# Patient Record
Sex: Female | Born: 1945 | Race: White | Hispanic: No | Marital: Married | Smoking: Never smoker
Health system: Southern US, Community
[De-identification: ages and names within clinical notes are randomized; demographics above are authoritative.]

## PROBLEM LIST (undated history)

## (undated) DIAGNOSIS — IMO0002 Reserved for concepts with insufficient information to code with codable children: Secondary | ICD-10-CM

## (undated) DIAGNOSIS — K219 Gastro-esophageal reflux disease without esophagitis: Secondary | ICD-10-CM

## (undated) DIAGNOSIS — M1990 Unspecified osteoarthritis, unspecified site: Secondary | ICD-10-CM

## (undated) DIAGNOSIS — R928 Other abnormal and inconclusive findings on diagnostic imaging of breast: Secondary | ICD-10-CM

## (undated) DIAGNOSIS — Z973 Presence of spectacles and contact lenses: Secondary | ICD-10-CM

## (undated) DIAGNOSIS — L259 Unspecified contact dermatitis, unspecified cause: Secondary | ICD-10-CM

## (undated) DIAGNOSIS — Z972 Presence of dental prosthetic device (complete) (partial): Secondary | ICD-10-CM

## (undated) DIAGNOSIS — IMO0001 Reserved for inherently not codable concepts without codable children: Secondary | ICD-10-CM

## (undated) DIAGNOSIS — C50919 Malignant neoplasm of unspecified site of unspecified female breast: Secondary | ICD-10-CM

## (undated) DIAGNOSIS — I10 Essential (primary) hypertension: Secondary | ICD-10-CM

## (undated) DIAGNOSIS — F411 Generalized anxiety disorder: Secondary | ICD-10-CM

## (undated) DIAGNOSIS — K08109 Complete loss of teeth, unspecified cause, unspecified class: Secondary | ICD-10-CM

## (undated) HISTORY — DX: Reserved for concepts with insufficient information to code with codable children: IMO0002

## (undated) HISTORY — DX: Unspecified contact dermatitis, unspecified cause: L25.9

## (undated) HISTORY — DX: Unspecified osteoarthritis, unspecified site: M19.90

## (undated) HISTORY — DX: Gastro-esophageal reflux disease without esophagitis: K21.9

## (undated) HISTORY — DX: Reserved for inherently not codable concepts without codable children: IMO0001

## (undated) HISTORY — DX: Other abnormal and inconclusive findings on diagnostic imaging of breast: R92.8

## (undated) HISTORY — DX: Generalized anxiety disorder: F41.1

## (undated) HISTORY — PX: MASTECTOMY: SHX3

## (undated) HISTORY — PX: OTHER SURGICAL HISTORY: SHX169

---

## 1978-01-13 HISTORY — PX: REDUCTION MAMMAPLASTY: SUR839

## 1980-01-14 HISTORY — PX: BREAST LUMPECTOMY: SHX2

## 1997-08-15 ENCOUNTER — Other Ambulatory Visit: Admission: RE | Admit: 1997-08-15 | Discharge: 1997-08-15 | Payer: Self-pay | Admitting: *Deleted

## 1997-08-29 ENCOUNTER — Other Ambulatory Visit: Admission: RE | Admit: 1997-08-29 | Discharge: 1997-08-29 | Payer: Self-pay | Admitting: *Deleted

## 1999-04-23 ENCOUNTER — Encounter: Admission: RE | Admit: 1999-04-23 | Discharge: 1999-04-23 | Payer: Self-pay | Admitting: *Deleted

## 1999-04-23 ENCOUNTER — Encounter: Payer: Self-pay | Admitting: Family Medicine

## 1999-08-06 ENCOUNTER — Emergency Department (HOSPITAL_COMMUNITY): Admission: EM | Admit: 1999-08-06 | Discharge: 1999-08-06 | Payer: Self-pay | Admitting: Emergency Medicine

## 1999-08-07 ENCOUNTER — Encounter: Payer: Self-pay | Admitting: Gastroenterology

## 2000-06-16 ENCOUNTER — Other Ambulatory Visit: Admission: RE | Admit: 2000-06-16 | Discharge: 2000-06-16 | Payer: Self-pay | Admitting: *Deleted

## 2000-07-01 ENCOUNTER — Encounter: Payer: Self-pay | Admitting: *Deleted

## 2000-07-01 ENCOUNTER — Encounter: Payer: Self-pay | Admitting: Family Medicine

## 2000-07-01 ENCOUNTER — Encounter: Admission: RE | Admit: 2000-07-01 | Discharge: 2000-07-01 | Payer: Self-pay | Admitting: Family Medicine

## 2000-07-01 ENCOUNTER — Encounter: Admission: RE | Admit: 2000-07-01 | Discharge: 2000-07-01 | Payer: Self-pay | Admitting: *Deleted

## 2001-07-01 ENCOUNTER — Other Ambulatory Visit: Admission: RE | Admit: 2001-07-01 | Discharge: 2001-07-01 | Payer: Self-pay | Admitting: Obstetrics and Gynecology

## 2001-07-02 ENCOUNTER — Encounter: Payer: Self-pay | Admitting: Family Medicine

## 2001-07-02 ENCOUNTER — Encounter: Admission: RE | Admit: 2001-07-02 | Discharge: 2001-07-02 | Payer: Self-pay | Admitting: Family Medicine

## 2002-07-27 ENCOUNTER — Other Ambulatory Visit: Admission: RE | Admit: 2002-07-27 | Discharge: 2002-07-27 | Payer: Self-pay | Admitting: Obstetrics and Gynecology

## 2002-08-01 ENCOUNTER — Encounter: Admission: RE | Admit: 2002-08-01 | Discharge: 2002-08-01 | Payer: Self-pay | Admitting: Family Medicine

## 2002-08-01 ENCOUNTER — Encounter: Payer: Self-pay | Admitting: Family Medicine

## 2003-08-02 ENCOUNTER — Encounter: Admission: RE | Admit: 2003-08-02 | Discharge: 2003-08-02 | Payer: Self-pay | Admitting: Family Medicine

## 2003-08-30 ENCOUNTER — Encounter: Admission: RE | Admit: 2003-08-30 | Discharge: 2003-08-30 | Payer: Self-pay | Admitting: Obstetrics and Gynecology

## 2004-10-15 ENCOUNTER — Encounter: Admission: RE | Admit: 2004-10-15 | Discharge: 2004-10-15 | Payer: Self-pay | Admitting: Obstetrics & Gynecology

## 2004-10-28 ENCOUNTER — Encounter: Admission: RE | Admit: 2004-10-28 | Discharge: 2004-10-28 | Payer: Self-pay | Admitting: Obstetrics & Gynecology

## 2005-06-16 ENCOUNTER — Encounter: Admission: RE | Admit: 2005-06-16 | Discharge: 2005-06-16 | Payer: Self-pay | Admitting: Family Medicine

## 2005-10-22 ENCOUNTER — Encounter: Admission: RE | Admit: 2005-10-22 | Discharge: 2005-10-22 | Payer: Self-pay | Admitting: Family Medicine

## 2006-09-02 ENCOUNTER — Other Ambulatory Visit: Admission: RE | Admit: 2006-09-02 | Discharge: 2006-09-02 | Payer: Self-pay | Admitting: Family Medicine

## 2006-11-24 ENCOUNTER — Encounter: Admission: RE | Admit: 2006-11-24 | Discharge: 2006-11-24 | Payer: Self-pay | Admitting: Family Medicine

## 2006-12-01 ENCOUNTER — Encounter: Admission: RE | Admit: 2006-12-01 | Discharge: 2006-12-01 | Payer: Self-pay | Admitting: Family Medicine

## 2006-12-11 ENCOUNTER — Encounter: Admission: RE | Admit: 2006-12-11 | Discharge: 2006-12-11 | Payer: Self-pay | Admitting: Family Medicine

## 2007-05-26 ENCOUNTER — Encounter: Admission: RE | Admit: 2007-05-26 | Discharge: 2007-05-26 | Payer: Self-pay | Admitting: Family Medicine

## 2007-06-18 ENCOUNTER — Emergency Department (HOSPITAL_COMMUNITY): Admission: EM | Admit: 2007-06-18 | Discharge: 2007-06-18 | Payer: Self-pay | Admitting: Emergency Medicine

## 2007-06-19 ENCOUNTER — Ambulatory Visit (HOSPITAL_COMMUNITY): Admission: EM | Admit: 2007-06-19 | Discharge: 2007-06-19 | Payer: Self-pay | Admitting: Emergency Medicine

## 2007-09-21 LAB — CONVERTED CEMR LAB
ALT: 20 units/L
AST: 22 units/L
Albumin: 4.2 g/dL
Alkaline Phosphatase: 136 units/L
BUN: 13 mg/dL
Basophils Relative: 1 %
Bilirubin Urine: NEGATIVE
CO2: 28 meq/L
Calcium: 10.4 mg/dL
Chloride: 105 meq/L
Cholesterol: 196 mg/dL
Creatinine, Ser: 0.7 mg/dL
Eosinophils Relative: 3 %
Glucose, Bld: 94 mg/dL
Glucose, Urine, Semiquant: NEGATIVE
HCT: 41.2 %
HDL: 56 mg/dL
Hemoglobin: 13.1 g/dL
Ketones, urine, test strip: NEGATIVE
LDL (calc): 25 mg/dL
LDL Cholesterol: 115 mg/dL
Lymphocytes, automated: 31 %
MCV: 90 fL
Monocytes Relative: 10 %
Nitrite: NEGATIVE
Platelets: 217 10*3/uL
Potassium: 3.7 meq/L
Protein, U semiquant: NEGATIVE
RBC: 4.58 M/uL
RDW: 13.2 %
Sodium: 142 meq/L
Specific Gravity, Urine: 1.014
TSH: 1.283 microintl units/mL
Total Protein: 7.5 g/dL
Triglyceride fasting, serum: 123 mg/dL
Urobilinogen, UA: 0.2
WBC: 6.1 10*3/uL
pH: 7

## 2007-09-24 ENCOUNTER — Other Ambulatory Visit: Admission: RE | Admit: 2007-09-24 | Discharge: 2007-09-24 | Payer: Self-pay | Admitting: Family Medicine

## 2008-01-26 ENCOUNTER — Encounter: Admission: RE | Admit: 2008-01-26 | Discharge: 2008-01-26 | Payer: Self-pay | Admitting: Family Medicine

## 2008-07-10 DIAGNOSIS — F411 Generalized anxiety disorder: Secondary | ICD-10-CM | POA: Insufficient documentation

## 2008-07-10 DIAGNOSIS — R609 Edema, unspecified: Secondary | ICD-10-CM | POA: Insufficient documentation

## 2008-07-10 DIAGNOSIS — Z8679 Personal history of other diseases of the circulatory system: Secondary | ICD-10-CM | POA: Insufficient documentation

## 2008-07-10 DIAGNOSIS — R0602 Shortness of breath: Secondary | ICD-10-CM | POA: Insufficient documentation

## 2008-07-10 DIAGNOSIS — K219 Gastro-esophageal reflux disease without esophagitis: Secondary | ICD-10-CM | POA: Insufficient documentation

## 2008-07-11 ENCOUNTER — Ambulatory Visit: Payer: Self-pay | Admitting: Internal Medicine

## 2008-07-11 DIAGNOSIS — M129 Arthropathy, unspecified: Secondary | ICD-10-CM | POA: Insufficient documentation

## 2008-07-11 DIAGNOSIS — Z9189 Other specified personal risk factors, not elsewhere classified: Secondary | ICD-10-CM | POA: Insufficient documentation

## 2008-07-11 DIAGNOSIS — Z87448 Personal history of other diseases of urinary system: Secondary | ICD-10-CM | POA: Insufficient documentation

## 2008-07-11 DIAGNOSIS — L259 Unspecified contact dermatitis, unspecified cause: Secondary | ICD-10-CM | POA: Insufficient documentation

## 2008-07-11 DIAGNOSIS — I10 Essential (primary) hypertension: Secondary | ICD-10-CM | POA: Insufficient documentation

## 2008-07-11 LAB — CONVERTED CEMR LAB: Pap Smear: NORMAL

## 2008-12-11 ENCOUNTER — Ambulatory Visit: Payer: Self-pay | Admitting: Internal Medicine

## 2008-12-11 LAB — CONVERTED CEMR LAB
ALT: 18 units/L (ref 0–35)
AST: 19 units/L (ref 0–37)
Albumin: 3.5 g/dL (ref 3.5–5.2)
Alkaline Phosphatase: 119 units/L — ABNORMAL HIGH (ref 39–117)
BUN: 8 mg/dL (ref 6–23)
Basophils Absolute: 0.1 10*3/uL (ref 0.0–0.1)
Basophils Relative: 1.3 % (ref 0.0–3.0)
Bilirubin Urine: NEGATIVE
Bilirubin, Direct: 0.2 mg/dL (ref 0.0–0.3)
CO2: 30 meq/L (ref 19–32)
Calcium: 9.5 mg/dL (ref 8.4–10.5)
Chloride: 107 meq/L (ref 96–112)
Cholesterol: 212 mg/dL — ABNORMAL HIGH (ref 0–200)
Creatinine, Ser: 0.7 mg/dL (ref 0.4–1.2)
Direct LDL: 137.4 mg/dL
Eosinophils Absolute: 0.2 10*3/uL (ref 0.0–0.7)
Eosinophils Relative: 2.4 % (ref 0.0–5.0)
GFR calc non Af Amer: 89.9 mL/min (ref >60)
Glucose, Bld: 92 mg/dL (ref 70–99)
HCT: 37.4 % (ref 36.0–46.0)
HDL: 61.8 mg/dL (ref >39.00)
Hemoglobin: 12.9 g/dL (ref 12.0–15.0)
Ketones, ur: NEGATIVE mg/dL
Lymphocytes Relative: 26 % (ref 12.0–46.0)
Lymphs Abs: 1.7 10*3/uL (ref 0.7–4.0)
MCHC: 34.6 g/dL (ref 30.0–36.0)
MCV: 89.8 fL (ref 78.0–100.0)
Monocytes Absolute: 0.6 10*3/uL (ref 0.1–1.0)
Monocytes Relative: 8.6 % (ref 3.0–12.0)
Neutro Abs: 3.8 10*3/uL (ref 1.4–7.7)
Neutrophils Relative %: 61.7 % (ref 43.0–77.0)
Nitrite: NEGATIVE
Platelets: 187 10*3/uL (ref 150.0–400.0)
Potassium: 3.5 meq/L (ref 3.5–5.1)
RBC: 4.16 M/uL (ref 3.87–5.11)
RDW: 12.3 % (ref 11.5–14.6)
Sodium: 142 meq/L (ref 135–145)
Specific Gravity, Urine: 1.015 (ref 1.000–1.030)
TSH: 1.19 microintl units/mL (ref 0.35–5.50)
Total Bilirubin: 1.2 mg/dL (ref 0.3–1.2)
Total CHOL/HDL Ratio: 3
Total Protein, Urine: NEGATIVE mg/dL
Total Protein: 6.6 g/dL (ref 6.0–8.3)
Triglycerides: 73 mg/dL (ref 0.0–149.0)
Urine Glucose: NEGATIVE mg/dL
Urobilinogen, UA: 0.2 (ref 0.0–1.0)
VLDL: 14.6 mg/dL (ref 0.0–40.0)
WBC: 6.4 10*3/uL (ref 4.5–10.5)
pH: 7 (ref 5.0–8.0)

## 2008-12-13 ENCOUNTER — Ambulatory Visit: Payer: Self-pay | Admitting: Internal Medicine

## 2008-12-15 ENCOUNTER — Encounter (INDEPENDENT_AMBULATORY_CARE_PROVIDER_SITE_OTHER): Payer: Self-pay | Admitting: *Deleted

## 2008-12-18 ENCOUNTER — Telehealth: Payer: Self-pay | Admitting: Internal Medicine

## 2008-12-22 ENCOUNTER — Emergency Department (HOSPITAL_COMMUNITY): Admission: EM | Admit: 2008-12-22 | Discharge: 2008-12-22 | Payer: Self-pay | Admitting: Emergency Medicine

## 2008-12-26 ENCOUNTER — Emergency Department (HOSPITAL_COMMUNITY): Admission: EM | Admit: 2008-12-26 | Discharge: 2008-12-26 | Payer: Self-pay | Admitting: Emergency Medicine

## 2009-02-08 ENCOUNTER — Encounter: Admission: RE | Admit: 2009-02-08 | Discharge: 2009-02-08 | Payer: Self-pay | Admitting: Internal Medicine

## 2009-02-08 LAB — HM MAMMOGRAPHY: HM Mammogram: NEGATIVE

## 2009-06-25 ENCOUNTER — Telehealth: Payer: Self-pay | Admitting: Internal Medicine

## 2009-06-26 ENCOUNTER — Encounter (INDEPENDENT_AMBULATORY_CARE_PROVIDER_SITE_OTHER): Payer: Self-pay | Admitting: *Deleted

## 2009-09-11 ENCOUNTER — Ambulatory Visit: Payer: Self-pay | Admitting: Internal Medicine

## 2009-09-11 DIAGNOSIS — N390 Urinary tract infection, site not specified: Secondary | ICD-10-CM | POA: Insufficient documentation

## 2009-09-11 LAB — CONVERTED CEMR LAB
Bilirubin Urine: NEGATIVE
Blood in Urine, dipstick: NEGATIVE
Glucose, Urine, Semiquant: NEGATIVE
Ketones, urine, test strip: NEGATIVE
Nitrite: NEGATIVE
Protein, U semiquant: NEGATIVE
Specific Gravity, Urine: <1.005
Urobilinogen, UA: 0.2
pH: 5

## 2009-10-23 ENCOUNTER — Ambulatory Visit: Payer: Self-pay | Admitting: Internal Medicine

## 2010-02-14 NOTE — Progress Notes (Signed)
Summary: Jury Duty  Phone Note Call from Patient Call back at Home Phone [phone] Call back at Work Phone [phone]   Caller: Patient [phone] Summary of Call: pt called requesting MD excuse her from Jury Duty due to extreme stress at home (Husband, Mother) and work stress. Pt says she just does not think her nerves can handle it. Please advise Initial call taken by: Margaret Pyle, CMA,  June 25, 2009 4:06 PM  Follow-up for Phone Call        you are welcome to generate such a note and i will sign for pt to pick up ( and remindpt there $20 fee for letters/forms outside of [facility])--- but i am not sure my note can excuse pt from jury duty. thanks Follow-up by: Newt Lukes MD,  June 25, 2009 4:22 PM  Additional Follow-up for Phone Call Additional follow up Details #1::        Generated letter for pt., had Dr. Felicity Coyer sign, called pt informed of $20 fee and to pickup letter at front at her convenience. Pt. agreed to all. Additional Follow-up by: Scharlene Gloss,  June 26, 2009 8:46 AM

## 2010-02-14 NOTE — Assessment & Plan Note (Signed)
Summary: flu shot/leschber/cd  Nurse Visit   Allergies: 1)  ! Sulfa 2)  ! Penicillin  Orders Added: 1)  Admin 1st Vaccine [90471] 2)  Flu Vaccine 54yrs + [98119] Flu Vaccine Consent Questions     Do you have a history of severe allergic reactions to this vaccine? no    Any prior history of allergic reactions to egg and/or gelatin? no    Do you have a sensitivity to the preservative Thimersol? no    Do you have a past history of Guillan-Barre Syndrome? no    Do you currently have an acute febrile illness? no    Have you ever had a severe reaction to latex? no    Vaccine information given and explained to patient? yes    Are you currently pregnant? no    Lot Number:[id number]   Exp Date:07/13/2010   Site Given  Left Deltoid IMu Vaccine 104yrs + [14782] .lbflu

## 2010-02-14 NOTE — Letter (Signed)
Summary: Generic Letter  [facility]  [address] [place], [place] [address]   Phone: [phone]  Fax: [phone]    06/26/2009  Deanna Buck [address] [place], [place]  [address]  To Whom it may concern,  The above patient is due to serve on [employer]. Due to stress in her home at this time I feel it may not be of benefit for her to serve.  Please excuse Deanna Buck from [employer].           Sincerely,   Dr. Rene Paci

## 2010-02-14 NOTE — Assessment & Plan Note (Signed)
Summary: frequency/urgency with little urine output/cd   Vital Signs:  Patient profile:   65 year old female Menstrual status:  regular Height:      63 inches (160.02 cm) Weight:      168.0 pounds (76.36 kg) BMI:     29.87 O2 Sat:      95 % Temp:     97.5 degrees F (36.39 degrees C) oral Pulse rate:   59 / minute BP sitting:   110 / 72  (left arm) Cuff size:   regular  Vitals Entered By: Orlan Leavens RMA (September 11, 2009 2:23 PM) CC: Urine frequency Is Patient Diabetic? No Pain Assessment Patient in pain? no      Comments Pt states last week she had episode having alot of urine fequency but when she would try to urinate she could'nt. Started taking Unistat (ovc) for bladder infection.    Primary Care Provider:  Newt Lukes MD  CC:  Urine frequency.  History of Present Illness: c/o urinary frequency - small vol voids no hematuria -  no abd or flank pain +hx same a/w UTI - improved but not resolved with otc meds  c/o ongoing periodic rash - currently on palms of both hands - gets worse with stress and weather changes   Clinical Review Panels:  CBC   WBC:  6.4 (12/11/2008)   RBC:  4.16 (12/11/2008)   Hgb:  12.9 (12/11/2008)   Hct:  37.4 (12/11/2008)   Platelets:  187.0 (12/11/2008)   MCV  89.8 (12/11/2008)   MCHC  34.6 (12/11/2008)   RDW  12.3 (12/11/2008)   PMN:  61.7 (12/11/2008)   Lymphs:  26.0 (12/11/2008)   Monos:  8.6 (12/11/2008)   Eosinophils:  2.4 (12/11/2008)   Basophil:  1.3 (12/11/2008)  Complete Metabolic Panel   Glucose:  92 (12/11/2008)   Sodium:  142 (12/11/2008)   Potassium:  3.5 (12/11/2008)   Chloride:  107 (12/11/2008)   CO2:  30 (12/11/2008)   BUN:  8 (12/11/2008)   Creatinine:  0.7 (12/11/2008)   Albumin:  3.5 (12/11/2008)   Total Protein:  6.6 (12/11/2008)   Calcium:  9.5 (12/11/2008)   Total Bili:  1.2 (12/11/2008)   Alk Phos:  119 (12/11/2008)   SGPT (ALT):  18 (12/11/2008)   SGOT (AST):  19 (12/11/2008)   Current  Medications (verified): 1)  Clonazepam 0.5 Mg Tabs (Clonazepam) .... Take 1 Two Times A Day Prn 2)  Nadolol 40 Mg Tabs (Nadolol) .... Take 1/2 By Mouth Qd 3)  Hydrochlorothiazide 25 Mg Tabs (Hydrochlorothiazide) .... Take 1 By Mouth Qd 4)  Valtrex 1 Gm Tabs (Valacyclovir Hcl) .... Use As Directed Prn 5)  Cyclobenzaprine Hcl 10 Mg Tabs (Cyclobenzaprine Hcl) .... Take 1 Three Times A Day Prn 6)  Cyanocobalamin 2500 Mcg  (Cyanocobalamin) .... Take 1 By Mouth Qd 7)  Alli .... Take 1 Three Times A Day 8)  Triamcinolone Acetonide 0.1 % Crea (Triamcinolone Acetonide) .... Apply To Affected Area As Needed 9)  Triamcinolone Acetonide 0.1 % Oint (Triamcinolone Acetonide) .... Apply Two Times A Day - Three Times A Day As Needed For Itch and Rash (Only Use On Palms or Soles of Feet)  Allergies (verified): 1)  ! Sulfa 2)  ! Penicillin  Past History:  Past Medical History: Anxiety GERD hypertension Osteoarthritis  Social History: Married but stressful relationship with spouse Never Smoked works at [employer]  Review of Systems  The patient denies fever, peripheral edema, and abdominal pain.  Physical Exam  General:  alert, well-developed, well-nourished, and cooperative to examination.    Lungs:  normal respiratory effort, no intercostal retractions or use of accessory muscles; normal breath sounds bilaterally - no crackles and no wheezes.    Heart:  normal rate, regular rhythm, no murmur, and no rub. BLE without edema.  Abdomen:  soft, non-tender, normal bowel sounds, no distention; no masses and no appreciable hepatomegaly or splenomegaly.   Psych:  Oriented X3, memory intact for recent and remote, normally interactive, good eye contact, min anxious appearing, not depressed appearing, and not agitated.      Impression & Recommendations:  Problem # 1:  UTI (ICD-599.0)  Her updated medication list for this problem includes:    Ciprofloxacin Hcl 500 Mg Tabs (Ciprofloxacin hcl)  .Marland Kitchen... 1 by mouth two times a day x 3days  Orders: Prescription Created Electronically [phone]) T-Culture, Urine ([id number])  Encouraged to push clear liquids, get enough rest, and take acetaminophen as needed. To be seen in 10 days if no improvement, sooner if worse.  Complete Medication List: 1)  Clonazepam 0.5 Mg Tabs (Clonazepam) .... Take 1 two times a day prn 2)  Nadolol 40 Mg Tabs (Nadolol) .... Take 1/2 by mouth qd 3)  Hydrochlorothiazide 25 Mg Tabs (Hydrochlorothiazide) .... Take 1 by mouth qd 4)  Valtrex 1 Gm Tabs (Valacyclovir hcl) .... Use as directed prn 5)  Cyclobenzaprine Hcl 10 Mg Tabs (Cyclobenzaprine hcl) .... Take 1 three times a day prn 6)  Cyanocobalamin 2500 Mcg (cyanocobalamin)  .... Take 1 by mouth qd 7)  Alli  .... Take 1 three times a day 8)  Triamcinolone Acetonide 0.1 % Crea (Triamcinolone acetonide) .... Apply to affected area as needed 9)  Triamcinolone Acetonide 0.1 % Oint (Triamcinolone acetonide) .... Apply two times a day - three times a day as needed for itch and rash (only use on palms or soles of feet) 10)  Ciprofloxacin Hcl 500 Mg Tabs (Ciprofloxacin hcl) .Marland Kitchen.. 1 by mouth two times a day x 3days  Other Orders: UA Dipstick w/o Micro (manual) (40347)  Patient Instructions: 1)  it was good to see you today. 2)  cipro for bladder infection symptoms - your prescription has been electronically submitted to your pharmacy. Please take as directed. Contact our office if you believe you're having problems with the medication(s).  3)  Please schedule a follow-up appointment in 12/2009, sooner if problems.  Prescriptions: CIPROFLOXACIN HCL 500 MG TABS (CIPROFLOXACIN HCL) 1 by mouth two times a day x 3days  #6 x 0   Entered and Authorized by:   Newt Lukes MD   Signed by:   Newt Lukes MD on 09/11/2009   Method used:   Electronically to        [facility] [phone]* (retail)       [address]       [place], [place]  [address]       Ph: [phone] or [phone]       Fax: [phone]   RxID:   [id number]   Laboratory Results   Urine Tests    Routine Urinalysis   Color: yellow Appearance: Clear Glucose: negative   (Normal Range: Negative) Bilirubin: negative   (Normal Range: Negative) Ketone: negative   (Normal Range: Negative) Spec. Gravity: <1.005   (Normal Range: 1.003-1.035) Blood: negative   (Normal Range: Negative) pH: 5.0   (Normal Range: 5.0-8.0) Protein: negative   (  Normal Range: Negative) Urobilinogen: 0.2   (Normal Range: 0-1) Nitrite: negative   (Normal Range: Negative) Leukocyte Esterace: moderate   (Normal Range: Negative)

## 2010-02-22 ENCOUNTER — Encounter (INDEPENDENT_AMBULATORY_CARE_PROVIDER_SITE_OTHER): Payer: Self-pay | Admitting: *Deleted

## 2010-02-22 ENCOUNTER — Other Ambulatory Visit: Payer: Self-pay | Admitting: Internal Medicine

## 2010-02-22 ENCOUNTER — Other Ambulatory Visit: Payer: BC Managed Care – PPO

## 2010-02-22 DIAGNOSIS — Z0000 Encounter for general adult medical examination without abnormal findings: Secondary | ICD-10-CM

## 2010-02-22 DIAGNOSIS — E785 Hyperlipidemia, unspecified: Secondary | ICD-10-CM

## 2010-02-22 LAB — CBC WITH DIFFERENTIAL/PLATELET
Basophils Absolute: 0.1 10*3/uL (ref 0.0–0.1)
Eosinophils Absolute: 0.2 10*3/uL (ref 0.0–0.7)
Eosinophils Relative: 2.4 % (ref 0.0–5.0)
HCT: 38.7 % (ref 36.0–46.0)
Hemoglobin: 13.5 g/dL (ref 12.0–15.0)
Lymphocytes Relative: 26.5 % (ref 12.0–46.0)
Lymphs Abs: 1.7 10*3/uL (ref 0.7–4.0)
MCHC: 34.9 g/dL (ref 30.0–36.0)
Monocytes Absolute: 0.7 10*3/uL (ref 0.1–1.0)
Monocytes Relative: 11.8 % (ref 3.0–12.0)
Neutrophils Relative %: 58.2 % (ref 43.0–77.0)
Platelets: 201 10*3/uL (ref 150.0–400.0)
RBC: 4.45 Mil/uL (ref 3.87–5.11)
RDW: 12.8 % (ref 11.5–14.6)
WBC: 6.3 10*3/uL (ref 4.5–10.5)

## 2010-02-22 LAB — BASIC METABOLIC PANEL
BUN: 14 mg/dL (ref 6–23)
CO2: 31 mEq/L (ref 19–32)
Calcium: 10 mg/dL (ref 8.4–10.5)
Chloride: 105 mEq/L (ref 96–112)
Creatinine, Ser: 0.8 mg/dL (ref 0.4–1.2)
GFR: 82.7 mL/min (ref >60.00)
Glucose, Bld: 93 mg/dL (ref 70–99)
Potassium: 3.7 mEq/L (ref 3.5–5.1)
Sodium: 141 mEq/L (ref 135–145)

## 2010-02-22 LAB — HEPATIC FUNCTION PANEL
ALT: 12 U/L (ref 0–35)
AST: 19 U/L (ref 0–37)
Alkaline Phosphatase: 115 U/L (ref 39–117)
Bilirubin, Direct: 0.2 mg/dL (ref 0.0–0.3)
Total Bilirubin: 0.9 mg/dL (ref 0.3–1.2)
Total Protein: 6.4 g/dL (ref 6.0–8.3)

## 2010-02-22 LAB — URINALYSIS
Bilirubin Urine: NEGATIVE
Hgb urine dipstick: NEGATIVE
Ketones, ur: NEGATIVE
Leukocytes, UA: NEGATIVE
Specific Gravity, Urine: 1.01 (ref 1.000–1.030)
Total Protein, Urine: NEGATIVE
Urine Glucose: NEGATIVE
Urobilinogen, UA: 0.2 (ref 0.0–1.0)
pH: 7 (ref 5.0–8.0)

## 2010-02-22 LAB — LIPID PANEL
Cholesterol: 204 mg/dL — ABNORMAL HIGH (ref 0–200)
HDL: 53.6 mg/dL (ref >39.00)
Total CHOL/HDL Ratio: 4
VLDL: 23.2 mg/dL (ref 0.0–40.0)

## 2010-02-22 LAB — LDL CHOLESTEROL, DIRECT: Direct LDL: 131.5 mg/dL

## 2010-02-22 LAB — TSH: TSH: 1.46 u[IU]/mL (ref 0.35–5.50)

## 2010-03-01 ENCOUNTER — Encounter: Payer: Self-pay | Admitting: Internal Medicine

## 2010-03-01 ENCOUNTER — Encounter (INDEPENDENT_AMBULATORY_CARE_PROVIDER_SITE_OTHER): Payer: BC Managed Care – PPO | Admitting: Internal Medicine

## 2010-03-01 DIAGNOSIS — J069 Acute upper respiratory infection, unspecified: Secondary | ICD-10-CM | POA: Insufficient documentation

## 2010-03-01 DIAGNOSIS — Z0000 Encounter for general adult medical examination without abnormal findings: Secondary | ICD-10-CM

## 2010-03-06 NOTE — Assessment & Plan Note (Signed)
Summary: cpx/bcbs/#   Vital Signs:  Patient profile:   65 year old female Menstrual status:  regular Height:      63 inches (160.02 cm) Weight:      163.75 pounds (74.43 kg) BMI:     29.11 O2 Sat:      94 % on Room air Temp:     98.2 degrees F (36.78 degrees C) oral Pulse rate:   60 / minute Pulse rhythm:   regular BP sitting:   128 / 82  (left arm) Cuff size:   regular  Vitals Entered By: Brenton Grills CMA (AAMA) (March 01, 2010 1:08 PM)  O2 Flow:  Room air CC: CPX/refills on Nadolo, HCTZ, Valtrex, Cyclobenzaprine and Triamcinolone cream/aj Is Patient Diabetic? No   Primary Care Provider:  Newt Lukes MD  CC:  CPX/refills on Nadolo, HCTZ, Valtrex, and Cyclobenzaprine and Triamcinolone cream/aj.  History of Present Illness: patient is here today for annual physical. Patient feels well and has no complaints.   also reviewed chronic med issues: freq UTI symptoms and infx  anxiety - takes as needed klonopin to help tol spouse's mood swings - has not used in a while; also on nadolol for same since mid90's + stress at home in relationship with spouse, as well as at work. has never been treated for depression or anxiety. Specifically years ago, went to therapy, counseling, when husband was cheating on her. Feels counseling, then, was not helpful -but would be agreeable to try again. reviewed and denies suicidal or homicidal ideation  htn - pt denies hx same but noted in chart review from prior md records. Patient attributes elevated blood pressure due to stress. Takes nadolol for stress as stated above, and hydrochlorothiazide for control of the swelling  abn mammo due to L breast cysts - no cancer - has been on q 34mo mammo since '07 until oct 09 - now annual -due q oct ; +fh breast ca in mom  recurrent outbreak shingles  L neck - takes suppressive valtrex for same - no current symptoms   follows with derm for occ derm issues - eczema of bilateral feet, left greater  than right, attributed to tanning booth skin injuries - also ongoing periodic rash - currently on palms of both hands - gets worse with stress and weather changes- controls with steroid cream as needed - needs refill  Preventive Screening-Counseling & Management  Alcohol-Tobacco     Alcohol drinks/day: 0     Alcohol Counseling: not indicated; patient does not drink     Smoking Status: never     Tobacco Counseling: not indicated; no tobacco use  Caffeine-Diet-Exercise     Diet Counseling: not indicated; diet is assessed to be healthy     Does Patient Exercise: no     Exercise Counseling: to improve exercise regimen     Depression Counseling: not indicated; screening negative for depression  Safety-Violence-Falls     Seat Belt Counseling: not indicated; patient wears seat belts     Helmet Counseling: not applicable     Firearm Counseling: not applicable     Violence Counseling: not indicated; no violence risk noted     Fall Risk Counseling: not indicated; no significant falls noted  Clinical Review Panels:  Prevention   Last Mammogram:  ASSESSMENT: Negative - BI-RADS 1^MM DIGITAL SCREENING (02/08/2009)   Last Pap Smear:  Normal (07/11/2008)  Immunizations   Last Tetanus Booster:  Td (12/13/2008)   Last Flu Vaccine:  Fluvax 3+ (10/23/2009)  Lipid Management   Cholesterol:  204 (02/22/2010)   LDL (bad choesterol):  115 (09/21/2007)   HDL (good cholesterol):  53.60 (02/22/2010)   Triglycerides:  123 (09/21/2007)  CBC   WBC:  6.3 (02/22/2010)   RBC:  4.45 (02/22/2010)   Hgb:  13.5 (02/22/2010)   Hct:  38.7 (02/22/2010)   Platelets:  201.0 (02/22/2010)   MCV  86.8 (02/22/2010)   MCHC  34.9 (02/22/2010)   RDW  12.8 (02/22/2010)   PMN:  58.2 (02/22/2010)   Lymphs:  26.5 (02/22/2010)   Monos:  11.8 (02/22/2010)   Eosinophils:  2.4 (02/22/2010)   Basophil:  1.1 (02/22/2010)  Complete Metabolic Panel   Glucose:  93 (02/22/2010)   Sodium:  141 (02/22/2010)   Potassium:   3.7 (02/22/2010)   Chloride:  105 (02/22/2010)   CO2:  31 (02/22/2010)   BUN:  14 (02/22/2010)   Creatinine:  0.8 (02/22/2010)   Albumin:  3.7 (02/22/2010)   Total Protein:  6.4 (02/22/2010)   Calcium:  10.0 (02/22/2010)   Total Bili:  0.9 (02/22/2010)   Alk Phos:  115 (02/22/2010)   SGPT (ALT):  12 (02/22/2010)   SGOT (AST):  19 (02/22/2010)   Current Medications (verified): 1)  Clonazepam 0.5 Mg Tabs (Clonazepam) .... Take 1 Two Times A Day Prn 2)  Nadolol 40 Mg Tabs (Nadolol) .... Take 1/2 By Mouth Qd 3)  Hydrochlorothiazide 25 Mg Tabs (Hydrochlorothiazide) .... Take 1 By Mouth Qd 4)  Valtrex 1 Gm Tabs (Valacyclovir Hcl) .... Use As Directed Prn 5)  Cyclobenzaprine Hcl 10 Mg Tabs (Cyclobenzaprine Hcl) .... Take 1 Three Times A Day Prn 6)  Cyanocobalamin 2500 Mcg  (Cyanocobalamin) .... Take 1 By Mouth Qd 7)  Alli .... Take 1 Three Times A Day 8)  Triamcinolone Acetonide 0.1 % Crea (Triamcinolone Acetonide) .... Apply To Affected Area As Needed 9)  Triamcinolone Acetonide 0.1 % Oint (Triamcinolone Acetonide) .... Apply Two Times A Day - Three Times A Day As Needed For Itch and Rash (Only Use On Palms or Soles of Feet)  Allergies (verified): 1)  ! Sulfa 2)  ! Penicillin  Past History:  Past medical, surgical, family and social histories (including risk factors) reviewed, and no changes noted (except as noted below).  Past Medical History: Anxiety GERD hypertension Osteoarthritis excema  MD roster: gyn - [facility] derm - drew jones    Past Surgical History: Reviewed history from 07/11/2008 and no changes required. 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 Family History of Arthritis (parent) Family History Breast cancer 1st degree relative <50 (mother) Family History High cholesterol (parents) Family History Hypertension  (parents)  Family History Kidney disease (parent) Mother had dementia in late 59's , expired 10/2008  Social History: Reviewed history from 09/11/2009 and no changes required. Married but stressful relationship with spouse Never Smoked -  works at [employer]  Does Patient Exercise:  no  Review of Systems       c/o nasal clear drainage, sneeze and ST w/o fever x24h; otherwise, see HPI above. I have reviewed all other systems and they were negative.   Physical Exam  General:  alert, well-developed, well-nourished, and cooperative to examination.    Head:  Normocephalic and atraumatic without obvious abnormalities. No apparent alopecia or balding. Eyes:  vision grossly intact; pupils equal, round and reactive to light.  conjunctiva and lids normal.  Ears:  normal pinnae bilaterally, without erythema, swelling, or tenderness to palpation. TMs clear, without effusion, or cerumen impaction. Hearing grossly normal bilaterally  Mouth:  teeth and gums in good repair; mucous membranes moist, without lesions or ulcers. oropharynx clear without exudate, min erythema. no pnd Neck:  supple, full ROM, no masses, no thyromegaly; no thyroid nodules or tenderness. no JVD or carotid bruits.   Lungs:  normal respiratory effort, no intercostal retractions or use of accessory muscles; normal breath sounds bilaterally - no crackles and no wheezes.    Heart:  normal rate, regular rhythm, no murmur, and no rub. BLE without edema. Abdomen:  soft, non-tender, normal bowel sounds, no distention; no masses and no appreciable hepatomegaly or splenomegaly.   Genitalia:  defer to gyn Msk:  No deformity or scoliosis noted of thoracic or lumbar spine.   Neurologic:  alert & oriented X3 and cranial nerves II-XII symetrically intact.  strength normal in all extremities, sensation intact to light touch, and gait normal. speech fluent without dysarthria or aphasia; follows commands with good comprehension.  Skin:   bilateral erythematous palm nodules L>R, nontender to palpation. Bilateral distal lower extremities with chronic eczema and nodules but none on soles of feet; No other skin changes noted on upper extremities, trunk or face Psych:  Oriented X3, memory intact for recent and remote, normally interactive, good eye contact, min anxious appearing, not depressed appearing, and not agitated.      Impression & Recommendations:  Problem # 1:  PREVENTIVE HEALTH CARE (ICD-V70.0) Patient has been counseled on age-appropriate routine health concerns for screening and prevention. These are reviewed and up-to-date. Immunizations are up-to-date or declined. Labs and ECG reviewed. info on zostavax provided Orders: Hemoccult Cards -3 specimans (take home) (16109) EKG w/ Interpretation (93000)  Problem # 2:  URI (ICD-465.9)  pt concerned this "always" progresses to bronchitis - reassurance provided but printed rx for zpak provided in case symptoms become worse  Instructed on symptomatic treatment. Call if symptoms persist or worsen.   Complete Medication List: 1)  Clonazepam 0.5 Mg Tabs (Clonazepam) .... Take 1 two times a day prn 2)  Nadolol 40 Mg Tabs (Nadolol) .... Take 1/2 by mouth qd 3)  Hydrochlorothiazide 25 Mg Tabs (Hydrochlorothiazide) .... Take 1 by mouth qd 4)  Valtrex 1 Gm Tabs (Valacyclovir hcl) .... Use as directed prn 5)  Cyclobenzaprine Hcl 10 Mg Tabs (Cyclobenzaprine hcl) .... Take 1 three times a day prn 6)  Cyanocobalamin 2500 Mcg (cyanocobalamin)  .... Take 1 by mouth qd 7)  Alli  .... Take 1 three times a day 8)  Triamcinolone Acetonide 0.1 % Crea (Triamcinolone acetonide) .... Apply to affected area as needed 9)  Triamcinolone Acetonide 0.1 % Oint (Triamcinolone acetonide) .... Apply two times a day - three times a day as needed for itch and rash (only use on palms or soles of feet) 10)  Azithromycin 250 Mg Tabs (Azithromycin) .... 2 tabs by mouth today, then 1 by mouth daily starting  tomorrow  - prescription expires 03/15/10  Patient Instructions: 1)  it was good to see you today. 2)  exam, labs and EKG today look good 3)  take home stool cards to screen for colon cancer - will call you if problems with these results to reconsider need for colonoscopy 4)  consider getting the Shingles vaccine 5)  keep followup with gynecology and mammogram as dsicussed 6)  refils on medications done today 7)  Zpak prescription provided to use if needed  if you develop worsening symptoms or fever, but it does not appear necessary to use any anitbiotic at this time  8)  Get plenty of rest, drink lots of clear liquids, and use Tylenol or Ibuprofen for fever and comfort. Return in 7-10 days if you're not better:sooner if you're feeling worse. 9)  Please schedule a follow-up appointment in annually for medical physical and labs, call sooner if problems.  Prescriptions: TRIAMCINOLONE ACETONIDE 0.1 % CREA (TRIAMCINOLONE ACETONIDE) apply to affected area as needed  #1 x 3   Entered and Authorized by:   Newt Lukes MD   Signed by:   Newt Lukes MD on 03/01/2010   Method used:   Electronically to        [facility] [phone]* (retail)       [address]       [place], [place]  [address]       Ph: [phone] or [phone]       Fax: [phone]   RxID:   [id number] CYCLOBENZAPRINE HCL 10 MG TABS (CYCLOBENZAPRINE HCL) take 1 three times a day prn  #45 x 2   Entered and Authorized by:   Newt Lukes MD   Signed by:   Newt Lukes MD on 03/01/2010   Method used:   Electronically to        [facility] [phone]* (retail)       [address]       [place], [place]  [address]       Ph: [phone] or [phone]       Fax: [phone]   RxID:   [id number] VALTREX 1 GM TABS (VALACYCLOVIR HCL) use as directed prn  #30 x 1   Entered and Authorized by:   Newt Lukes MD   Signed by:    Newt Lukes MD on 03/01/2010   Method used:   Electronically to        [facility] [phone]* (retail)       [address]       [place], [place]  [address]       Ph: [phone] or [phone]       Fax: [phone]   RxID:   [id number] HYDROCHLOROTHIAZIDE 25 MG TABS (HYDROCHLOROTHIAZIDE) take 1 by mouth qd  #30 Tablet x 11   Entered and Authorized by:   Newt Lukes MD   Signed by:   Newt Lukes MD on 03/01/2010   Method used:   Electronically to        [facility] [phone]* (retail)       [address]       [place], [place]  [address]       Ph: [phone] or [phone]       Fax: [phone]   RxID:   [id number] NADOLOL 40 MG TABS (NADOLOL) take 1/2 by mouth qd  #15 x 11   Entered and Authorized by:   Newt Lukes MD   Signed by:   Newt Lukes MD on 03/01/2010   Method used:   Electronically to        [facility] [phone]* (retail)  [address]       [place], [place]  [address]       Ph: [phone] or [phone]       Fax: [phone]   RxID:   [id number] AZITHROMYCIN 250 MG TABS (AZITHROMYCIN) 2 tabs by mouth today, then 1 by mouth daily starting tomorrow  - PRESCRIPTION EXPIRES 03/15/10  #6 x 0   Entered and Authorized by:   Newt Lukes MD   Signed by:   Newt Lukes MD on 03/01/2010   Method used:   Print then Give to Patient   RxID:   [id number]    Orders Added: 1)  Est. Patient 40-64 years [01093] 2)  Hemoccult Cards -3 specimans (take home) [82272] 3)  EKG w/ Interpretation [93000]   Contraindications/Deferment of Procedures/Staging:    Test/Procedure: Colonoscopy    Reason for deferment: patient declined

## 2010-03-12 ENCOUNTER — Other Ambulatory Visit: Payer: Self-pay | Admitting: Internal Medicine

## 2010-03-12 DIAGNOSIS — Z1231 Encounter for screening mammogram for malignant neoplasm of breast: Secondary | ICD-10-CM

## 2010-03-20 ENCOUNTER — Ambulatory Visit
Admission: RE | Admit: 2010-03-20 | Discharge: 2010-03-20 | Disposition: A | Discharge status: Home / Self Care | Payer: BC Managed Care – PPO | Source: Ambulatory Visit | Attending: Internal Medicine | Admitting: Internal Medicine

## 2010-03-20 ENCOUNTER — Encounter: Payer: Self-pay | Admitting: Internal Medicine

## 2010-03-20 DIAGNOSIS — Z1231 Encounter for screening mammogram for malignant neoplasm of breast: Secondary | ICD-10-CM

## 2010-04-16 LAB — URINALYSIS, ROUTINE W REFLEX MICROSCOPIC
Bilirubin Urine: NEGATIVE
Bilirubin Urine: NEGATIVE
Glucose, UA: NEGATIVE mg/dL
Ketones, ur: NEGATIVE mg/dL
Ketones, ur: NEGATIVE mg/dL
Nitrite: NEGATIVE
Specific Gravity, Urine: 1.023 (ref 1.005–1.030)
Urobilinogen, UA: 0.2 mg/dL (ref 0.0–1.0)
pH: 7 (ref 5.0–8.0)

## 2010-04-16 LAB — CBC
MCHC: 33.5 g/dL (ref 30.0–36.0)
MCV: 89.1 fL (ref 78.0–100.0)
Platelets: 236 10*3/uL (ref 150–400)

## 2010-04-16 LAB — BASIC METABOLIC PANEL
BUN: 15 mg/dL (ref 6–23)
CO2: 27 mEq/L (ref 19–32)
Chloride: 104 mEq/L (ref 96–112)
Creatinine, Ser: 0.72 mg/dL (ref 0.4–1.2)
Glucose, Bld: 103 mg/dL — ABNORMAL HIGH (ref 70–99)

## 2010-04-16 LAB — DIFFERENTIAL
Basophils Relative: 1 % (ref 0–1)
Eosinophils Absolute: 0.1 10*3/uL (ref 0.0–0.7)
Neutrophils Relative %: 78 % — ABNORMAL HIGH (ref 43–77)

## 2010-05-28 NOTE — Op Note (Signed)
Deanna Buck, MOGLE NO.:  [id number]   MEDICAL RECORD NO.:  [id number]          PATIENT TYPE:  EMS   LOCATION:  ED                           FACILITY:  [facility]   PHYSICIAN:  Mark C. Vernie Ammons, M.D.  DATE OF BIRTH:  1945/12/02   DATE OF PROCEDURE:  06/19/2007  DATE OF DISCHARGE:                               OPERATIVE REPORT   PREOPERATIVE DIAGNOSIS:  Left distal ureteral stone.   POSTOPERATIVE DIAGNOSIS:  Left distal ureteral stone.   PROCEDURES:  1. Cystoscopy.  2. Left retrograde pyelogram with interpretation.  3. Left ureteroscopy with stone extraction.   SURGEON:  Mark C. Vernie Ammons, M.D.   ANESTHESIA:  General.   SPECIMENS:  Stone, given to patient.   BLOOD LOSS:  None.   COMPLICATIONS:  Drains none.   COMPLICATIONS:  None.   INDICATIONS:  The patient is a 65 year old white female with a left  distal ureteral stone seen on a CT scan yesterday.  She had had prior  surgery on her left kidney years ago and the details of that surgery are  unclear.  A CT scan revealed what appeared to be chronic hydronephrosis  on the left-hand side and a mildly dilated ureter down to a 4-mm stone  in the distal ureter.  Medical expulsive therapy was attempted but the  patient continued to have intermittent severe pain not controlled with  oral narcotic analgesics and called me stating that she wanted to have  her stone removed.  The risks, complications and alternatives have been  discussed and the patient understands and has elected to proceed.  A  preop KUB revealed a faint density in the area of the distal ureter,  most consistent with her stone.   DESCRIPTION OF OPERATION:  After informed consent, the patient was  brought to the major OR, placed on the table and administered general  anesthesia and then moved to the dorsal lithotomy position, after which  her genitalia were sterilely prepped and draped.  An official time-out  was then performed and initially a  22-French cystoscope was introduced  into the bladder and the bladder was fully inspected and noted be free  of any tumor, stones or inflammatory lesions.  Ureteral orifices were in  normal configuration and position.   A 5-French open-ended ureteral catheter was then passed through the  cystoscope and into the left distal ureter and a retrograde pyelogram  was performed.  This was performed by injecting full-strength contrast  under direct fluoroscopy and I noted the stone present in the distal  ureter seen as a filling defect.  There was minimal dilation of the  ureter throughout its course up to the area of the renal pelvis, where a  very capacious renal pelvis was identified with the ureter entering in  in a dependent portion.  It had the appearance of a prior repair of a  ureteropelvic junction obstruction with no jetting of the contrast into  the renal pelvis.  It appeared that the ureteropelvic junction region  was widely patent and that the fullness of the collecting system and  renal pelvis was chronic.   I removed the cystoscope and then introduced a 6-French rigid  ureteroscope and was able to gently negotiate this into the left  ureteral orifice.  I passed the ureteroscope up the ureter a short way  and was able to identify the stone.  It was photographed and then  grasped with a nitinol basket.  Then I maneuvered the stone so that I  was easily able to draw it up against the tip of the ureteroscope and  then extracted the stone in the scope without difficulty.  I then  drained the bladder and the patient was awakened and taken to the  recovery room in stable, satisfactory condition.  She tolerated the  procedure well and there no intraoperative complications.  She has pain  medication at home which she will use, and I have written her a  prescription for 5 days of Cipro 500 mg b.i.d.  She will then bring the  stone in for analysis and be contacted with those results in  order to  help prevent further stone formation.      Mark C. Vernie Ammons, M.D.  Electronically Signed     MCO/MEDQ  D:  06/19/2007  T:  06/19/2007  Job:  [id number]

## 2010-05-28 NOTE — Consult Note (Signed)
Deanna Buck, NAWABI NO.:  [id number]   MEDICAL RECORD NO.:  [id number]          PATIENT TYPE:  EMS   LOCATION:  ED                           FACILITY:  [facility]   PHYSICIAN:  Lindaann Slough, M.D.  DATE OF BIRTH:  02/04/1945   DATE OF CONSULTATION:  06/18/2007  DATE OF DISCHARGE:                                 CONSULTATION   REASON FOR CONSULTATION:  Severe left flank pain and left ureteral  stone, left hydronephrosis.   The patient is a 65 year old female who was in her usual state of health  until yesterday morning when she started having left flank discomfort.  The pain got progressively worse throughout the day, and got moderately  severe in the early evening last night, and became severe last night and  early this morning.  It was not associated with nausea and vomiting.  She has no frequency, urgency, hesitancy or straining on urination.  She  has no history of kidney stones.  However, in 1974 or 1975 she had  surgery on her left kidney.  She does not know the exact nature of the  surgery, but she has a left flank incision, and that was done in  [place], [place] when she was about 4 or 65 years old.  She has no  incision in the suprapubic area.  A CT scan showed severe left  hydronephrosis and moderate left hydroureter and a 4-mm stone at the  left UVJ.  There is no evidence of perinephric stranding.   PAST MEDICAL HISTORY:  Positive for anxiety.   PAST SURGICAL HISTORY:  She had surgery on her left kidney about 34  years ago.   SOCIAL HISTORY:  She is married, has 2 children, does not smoke and is a  social drinker.   FAMILY HISTORY:  Her father died of congestive heart failure in 2002.  Her mother had mastectomy in 1979 for breast cancer and has a history of  hypertension, and she recently had a surgery for hip fracture.  She had  1 brother who died in 23 at age 63 of a massive heart attack.   MEDICATIONS:  1. Hydrochlorothiazide 25 mg a  day.  2. Nadolol for anxiety.   ALLERGIES:  She is allergic to SULFA and there is a questionable history  of allergy to PENICILLIN.   REVIEW OF SYSTEMS:  As per the HPI, and it is otherwise negative.   PHYSICAL EXAMINATION:  This is a well-developed 65 year old female who  is currently in no acute distress.  She does not have any pain at this  time.  She is alert and oriented to time, place, and person.  Her blood pressure is 113/68, pulse 83, respirations 20, temperature  98.3.  Her head is normal and she has pink conjunctivae.  Ears, nose, and throat are within normal limits.  NECK:  Supple, no cervical adenopathy, no thyromegaly.  CHEST:  Symmetrical, lungs are fully expanded and clear to percussion  and auscultation.  HEART:  Regular rhythm.  ABDOMEN:  Soft.  She has a well-healed left flank incision.  Liver,  spleen and kidneys are not palpable.  She is tender in the suprapubic  area, and she feels that she has to urinate.  Bowel sounds are normal.  GENITALIA:  She has normal female genitalia, her meatus is normal.  There is no cystocele.  On pelvic examination, there is some tenderness  in the bladder area.  The cervix is firm in the midline, nontender.  There is no adnexal mass.  RECTAL EXAMINATION:  Sphincter tone is normal with no evidence of rectal  mass.  EXTREMITIES:  Within normal limits.   Her hemoglobin is 13.5, hematocrit 39.2, and WBC 10.1.  BUN is 18,  creatinine 1.2.   I reviewed the CT scan with Dr. Bradly Chris.  She has marked hydronephrosis;  however, the ureter is not markedly dilated.  She has mild-to-moderate  left hydroureter, and there is a 4-mm stone at the left UVJ.  There is  also a small stone in the left kidney, and there is no perinephric  stranding.  We think that the hydronephrosis is probably chronic in  nature based on her history of left renal surgery 35 years ago.   IMPRESSION:  1. Left ureteral calculus.  2. Left renal stone.  3. Left  hydronephrosis.   PLAN:  I discussed those findings with the patient and her husband.  I  told them that the treatment options are cystoscopy with ureteroscopy,  stone manipulation, and insertion of double-J catheter versus  conservative management with analgesics and alpha blockers to see if she  would be able to pass the stone spontaneously.  I told her if she  chooses the conservative approach, if she starts having pain again and  the pain is not relieved by analgesics, she needs to call me in which  case we would proceed with stone manipulation.  She would prefer the  conservative approach at this time.  I will see her in the office on  Monday if she does not have any pain over the weekend; however, if she  starts having pain I have asked her to call the office in which case we  would proceed with stone manipulation.  I gave her a prescription for  Percocet 5/500 #40 one or two tablets q.4 h. p.r.n. for pain and Flomax  1 capsule daily.  I answered all their questions to their stated  satisfaction, and as previously stated I will see her in the office on  Monday if she does not have any pain over the weekend.      Lindaann Slough, M.D.  Electronically Signed     MN/MEDQ  D:  06/18/2007  T:  06/18/2007  Job:  [id number]

## 2010-07-01 ENCOUNTER — Encounter: Payer: Self-pay | Admitting: *Deleted

## 2010-07-01 ENCOUNTER — Ambulatory Visit (INDEPENDENT_AMBULATORY_CARE_PROVIDER_SITE_OTHER): Payer: BC Managed Care – PPO | Admitting: Internal Medicine

## 2010-07-01 ENCOUNTER — Encounter: Payer: Self-pay | Admitting: Internal Medicine

## 2010-07-01 DIAGNOSIS — H15003 Unspecified scleritis, bilateral: Secondary | ICD-10-CM

## 2010-07-01 DIAGNOSIS — H15009 Unspecified scleritis, unspecified eye: Secondary | ICD-10-CM

## 2010-07-01 DIAGNOSIS — J019 Acute sinusitis, unspecified: Secondary | ICD-10-CM

## 2010-07-01 MED ORDER — LEVOFLOXACIN 500 MG PO TABS: 500.0000 mg | ORAL_TABLET | Freq: Every day | ORAL | Status: DC

## 2010-07-01 NOTE — Patient Instructions (Signed)
It was good to see you today. Levaquin for possible sinus infection symptoms - Your prescription(s) have been submitted to your pharmacy. Please take as directed and contact our office if you believe you are having problem(s) with the medication(s). we'll make referral to eye specialist (MD). Our office will contact you regarding appointment(s) once made. Do not wear contacts until this evaluation is done Work excuse note for next 3 days - call if additional time is needed for appointment to be scheduled and completed

## 2010-07-01 NOTE — Progress Notes (Signed)
Subjective:    Patient ID: Deanna Buck, female    DOB: [birth year], 65 y.o.   MRN: [id number]  HPI Here with L eye redness and pain - ?sinus infection No hx prior sinus problems but feels pressure in left maxillary and frontal region No fever or headache  Onset 6 weeks ago - ?precipitated by new contacts 7 weeks ago Has seen optometrist x 5 visits in last 6 weeks for same (roth) -  no relief with eye drops (bottle of oxoflox drops with pt today) +eye pain with light - outdoors or computer screen - blurred vision but no vision loss +excess tearing with constant clear drainage L>R side, no itch but feels "scratchy" Right eye normal until 4 days ago - now increasing redness in right eye Denies trauma or FB  Also reviewed chronic med issues:   anxiety - takes as needed klonopin to help tol spouse's mood swings - has not used in a while; also on nadolol for same since mid90's + stress at home in relationship with spouse, as well as at work. has never been treated for depression or anxiety. Specifically years ago, went to therapy, counseling, when husband was cheating on her. Feels counseling, then, was not helpful -but would be agreeable to try again. reviewed and denies suicidal or homicidal ideation   htn - pt denies hx same but noted in chart review from prior md records. Patient attributes elevated blood pressure due to stress. Takes nadolol for stress as stated above, and hydrochlorothiazide for control of the swelling   abn mammo due to L breast cysts - no cancer - has been on q 76mo mammo since '07 until oct 09 - now annual -due q oct ; +fh breast ca in mom  recurrent outbreak shingles L neck - takes suppressive valtrex for same - no current symptoms   derm issues - follows with dr. Dub Amis prn - eczema of bilateral feet, left greater than right, attributed to tanning booth skin injuries - also ongoing periodic rash - currently on palms of both hands - gets worse with stress and weather  changes- controls with steroid cream as needed - needs refill   Past Medical History  Diagnosis Date  . ECZEMA   . PERIPHERAL EDEMA   . ANXIETY   . GERD   . HYPERTENSION   . Osteoarthritis     Review of Systems  Constitutional: Negative for fever.  HENT: Negative for ear pain and postnasal drip.   Eyes: Positive for photophobia, pain and redness. Negative for itching.  Respiratory: Negative for cough.   Neurological: Negative for headaches.       Objective:   Physical Exam BP 134/72  Pulse 56  Temp(Src) 98.7 F (37.1 C) (Oral)  Ht 5\' 3"  (1.6 m)  Wt 160 lb 12.8 oz (72.938 kg)  BMI 28.48 kg/m2  SpO2 97% Physical Exam  Constitutional: She is oriented to person, place, and time. She appears well-developed and well-nourished. wearing dark sunglasses HENT: Head: Normocephalic and atraumatic. Ears; B TMs ok, no erythema or effusion; Nose: Nose normal. Mild tenderness to palp over L maxillary region. Mouth/Throat: Oropharynx is clear and moist. No oropharyngeal exudate.  Eyes: Red/injected scleritis/conjuncitivits L>>R, pupils constricted but are equal, round, and reactive to light. No scleral icterus.  EOMI. Vision grossly intact (reads magazine article); mild periorbital edema Neck: Normal range of motion. Neck supple. No JVD present. No thyromegaly present.  Cardiovascular: Normal rate, regular rhythm and normal heart sounds.  No murmur  heard. No BLE edema. Pulmonary/Chest: Effort normal and breath sounds normal. No respiratory distress. She has no wheezes.  Neurological: She is alert and oriented to person, place, and time. No cranial nerve deficit. Coordination normal.  Skin: Skin is warm and dry. No rash noted. No erythema.        Assessment & Plan:  L>R red eye - ?scleritis vs conjunctivitis - doubt infx process as primary problem but will tx systemic abx given maxillary tenderness - will arrange for urgent optho eval to further eval and tx same - advised no contact lens  in either eye until further eye eval - also work note given pain with reading computer screen

## 2010-07-13 ENCOUNTER — Encounter: Payer: Self-pay | Admitting: Internal Medicine

## 2010-10-10 LAB — CBC
HCT: 39.2
Hemoglobin: 13.5
RBC: 4.53
RDW: 12.6

## 2010-10-10 LAB — POCT I-STAT, CHEM 8
BUN: 18
Calcium, Ion: 1.28
Chloride: 105
Creatinine, Ser: 1.2
Glucose, Bld: 125 — ABNORMAL HIGH
HCT: 39

## 2010-10-10 LAB — URINALYSIS, ROUTINE W REFLEX MICROSCOPIC
Bilirubin Urine: NEGATIVE
Glucose, UA: NEGATIVE
Ketones, ur: NEGATIVE
Nitrite: POSITIVE — AB
Protein, ur: NEGATIVE

## 2010-10-10 LAB — URINE MICROSCOPIC-ADD ON

## 2010-12-11 ENCOUNTER — Ambulatory Visit (INDEPENDENT_AMBULATORY_CARE_PROVIDER_SITE_OTHER): Payer: BC Managed Care – PPO

## 2010-12-11 DIAGNOSIS — Z23 Encounter for immunization: Secondary | ICD-10-CM

## 2010-12-31 IMAGING — CT CT ABDOMEN W/O CM
1 of 2 series · 13 of 32 positions shown, 19 images · non-contrast
Comparison: 06/18/2007.

CT ABDOMEN

CLINICAL DATA: Left-sided back pain.  Prior left renal surgery due
to congenital abnormality.  Hematuria.  Rule out kidney stone.

CT ABDOMEN AND PELVIS WITHOUT CONTRAST
TECHNIQUE: Multidetector CT imaging of the abdomen and pelvis was
performed following the standard protocol without intravenous
contrast.

[Series 2: under 200# stone no prev · axial · 0.71mm/px · z∈[-412,-32]mm · 13 of 88 slices shown, 19 images]
[im 6/88  soft-tissue]
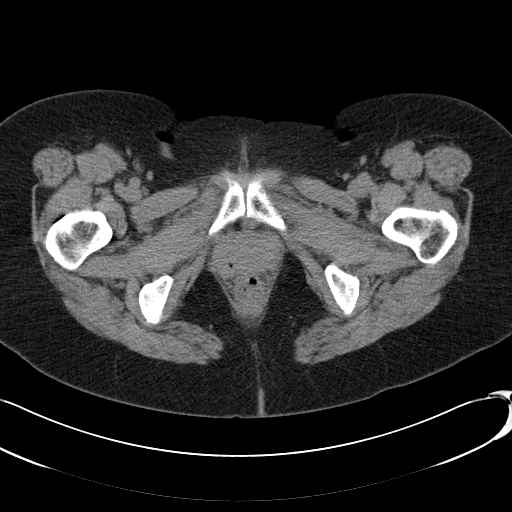
[im 6/88  bone]
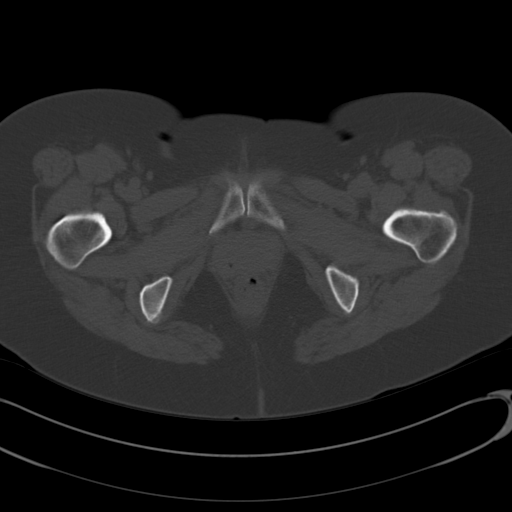
[im 12/88  soft-tissue]
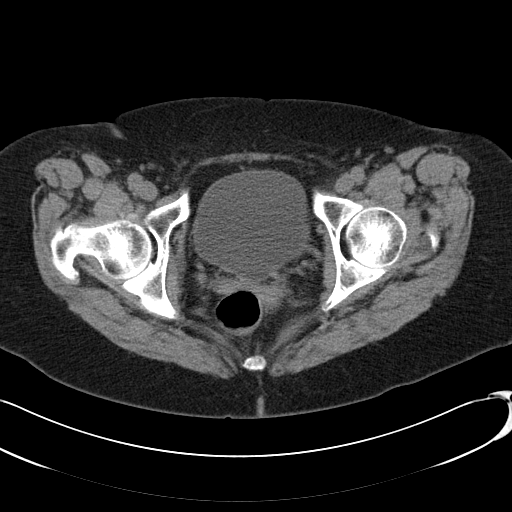
[im 18/88  soft-tissue]
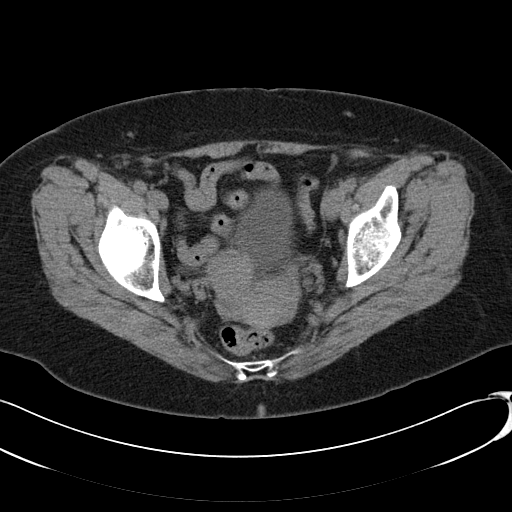
[im 24/88  soft-tissue]
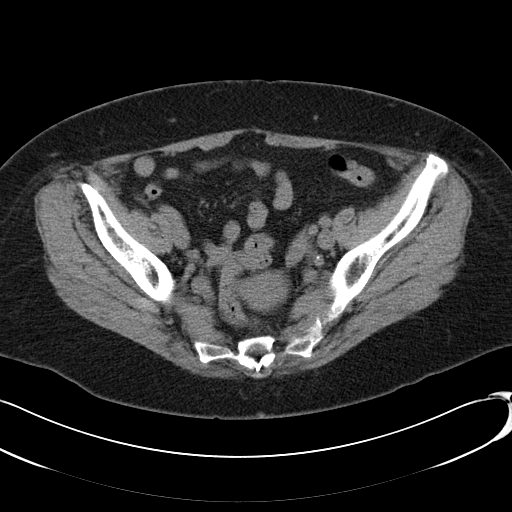
[im 30/88  soft-tissue]
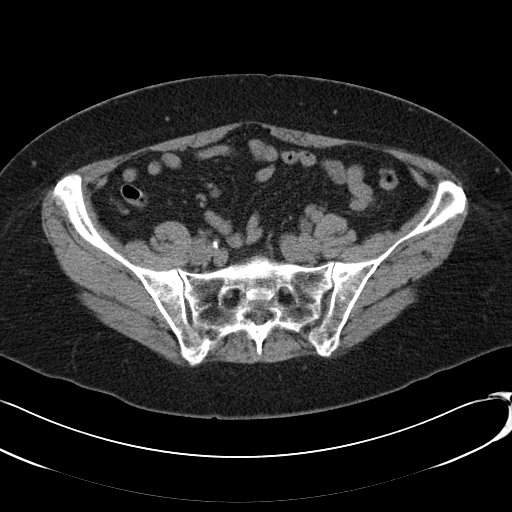
[im 35/88  soft-tissue]
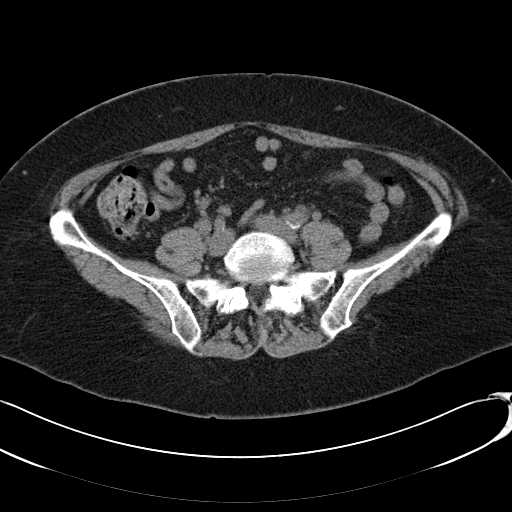
[im 47/88  soft-tissue]
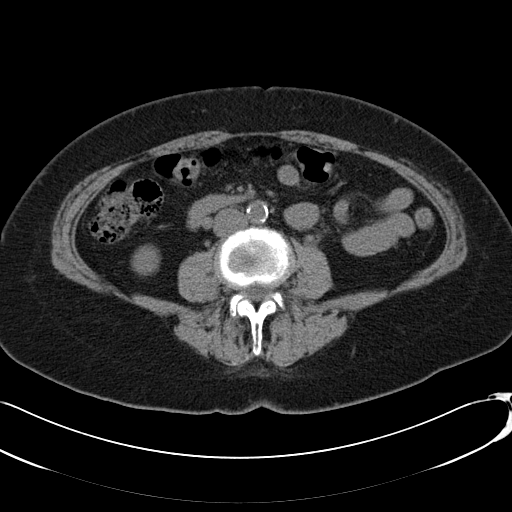
[im 53/88  soft-tissue]
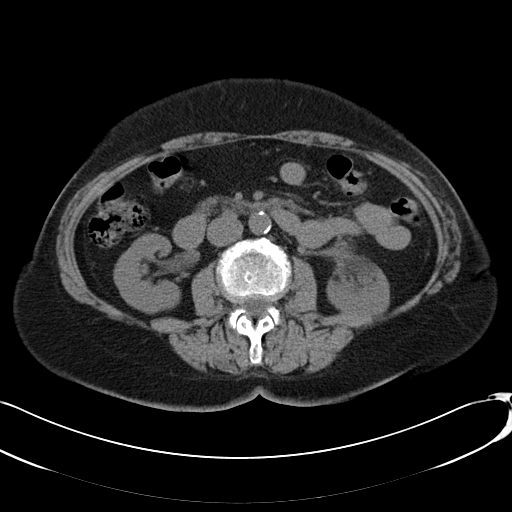
[im 59/88  soft-tissue]
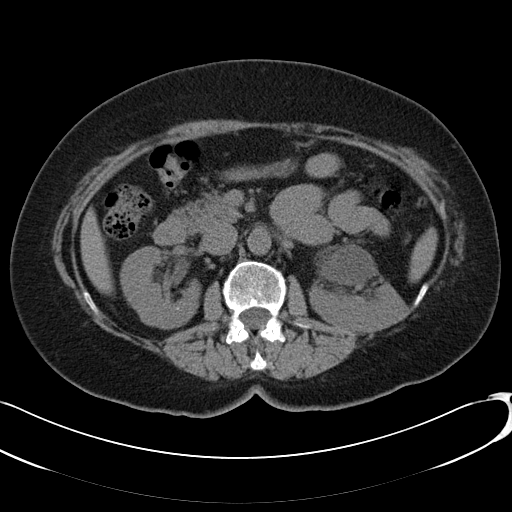
[im 59/88  bone]
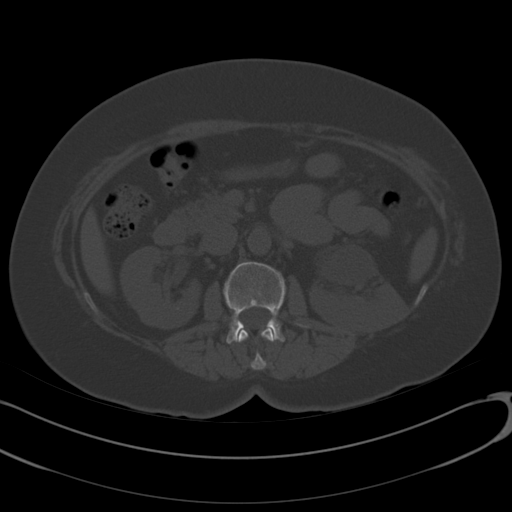
[im 64/88  soft-tissue]
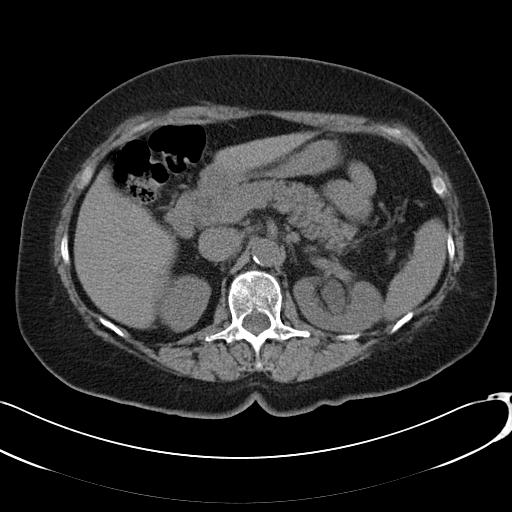
[im 64/88  lung]
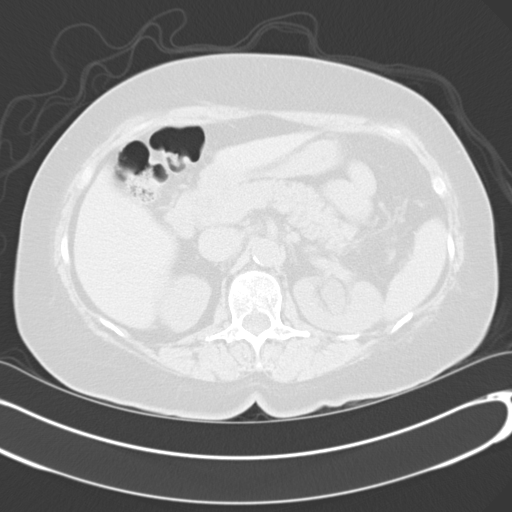
[im 70/88  soft-tissue]
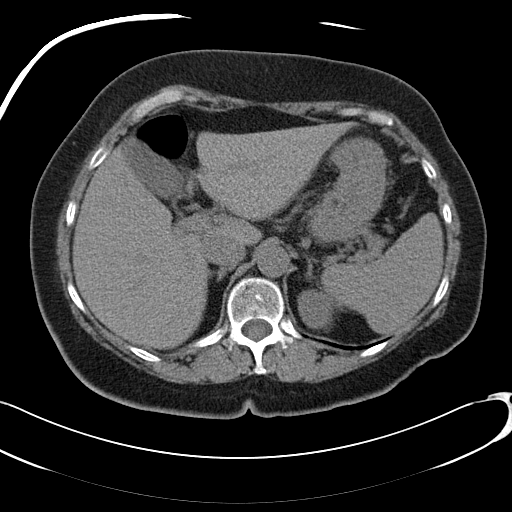
[im 70/88  lung]
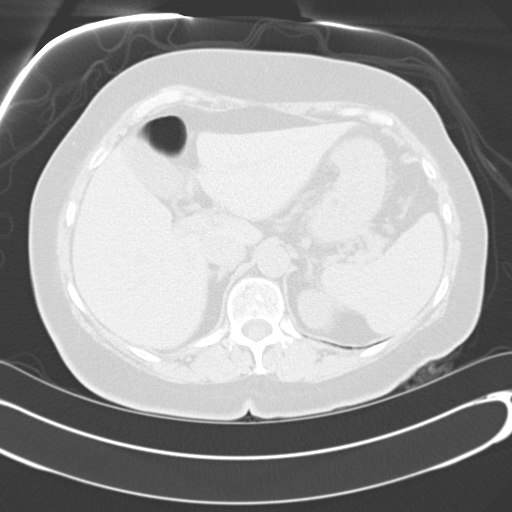
[im 76/88  soft-tissue]
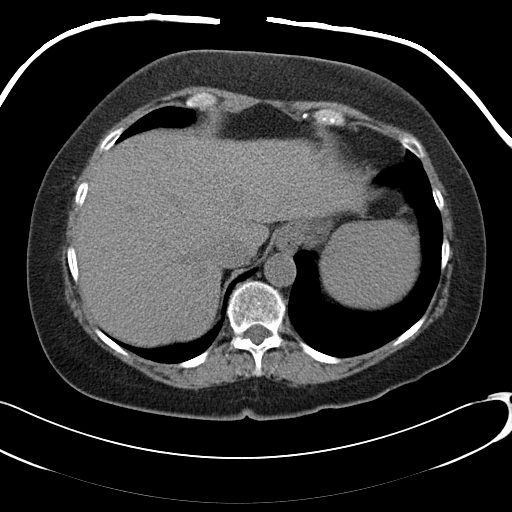
[im 76/88  lung]
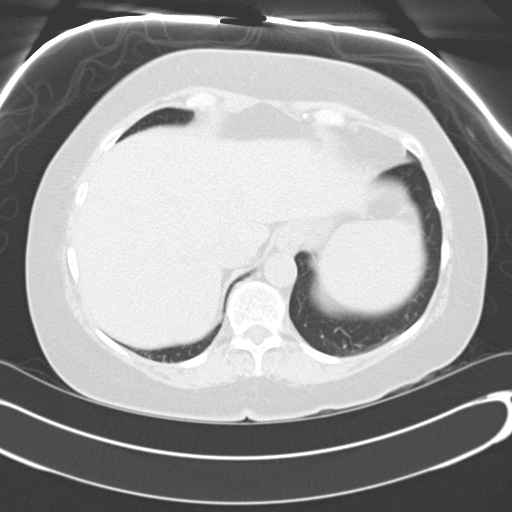
[im 82/88  soft-tissue]
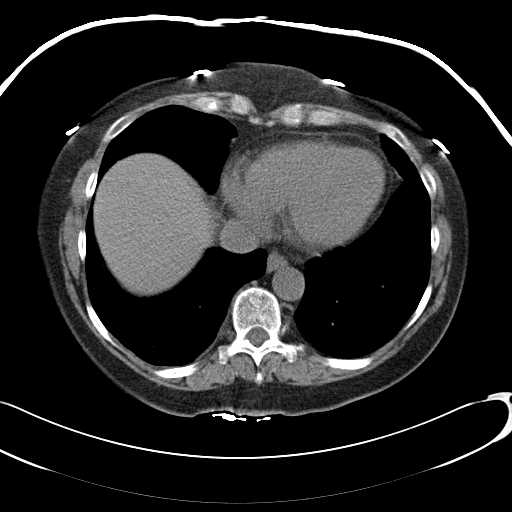
[im 82/88  lung]
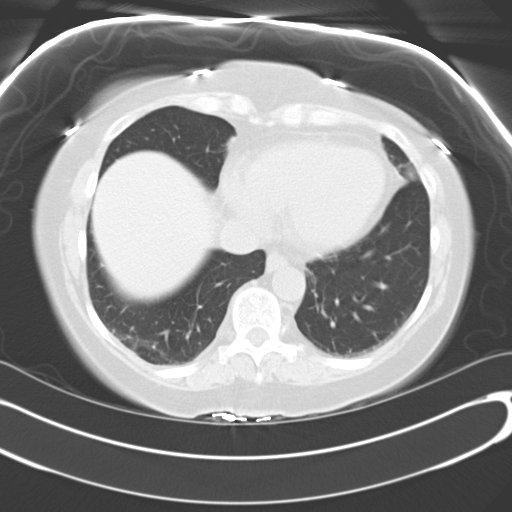

[13 of 32 positions shown; findings below may reference images not displayed]

FINDINGS: Obstructed left renal collecting system secondary to
distal ureteral calculi as described below.  There also bilateral
nonobstructing small renal calculi.

Unenhanced imaging of the liver, spleen, pancreas and adrenal
glands unremarkable.  Atherosclerotic type changes of the aorta
without aneurysmal dilation.  Scattered degenerative changes
throughout the lower thoracic and lumbar spine without worrisome
bony destructive lesion.  No calcified gallstone.  Limited
evaluation of bowel secondary to lack of contrast and distension
without gross abnormality identified.  There may be a tiny hiatal
hernia.
IMPRESSION: Obstructed left renal collecting system secondary to distal left
ureteral calculus as discussed below.

CT PELVIS
FINDINGS: 5.1 mm distal left ureteral obstructing stone located
1.8 cm proximal to the left ureteral vesicle junction.  No pelvic
inflammatory process identified. Uterine fibroid. No worrisome bony
destructive process.
IMPRESSION: 5.1 mm distal left ureteral obstructing stone located 1.8 cm
proximal to the left ureteral vesicle junction.

## 2011-02-17 ENCOUNTER — Encounter: Payer: Self-pay | Admitting: Internal Medicine

## 2011-02-17 ENCOUNTER — Ambulatory Visit (INDEPENDENT_AMBULATORY_CARE_PROVIDER_SITE_OTHER): Payer: BC Managed Care – PPO | Admitting: Internal Medicine

## 2011-02-17 VITALS — BP 130/72 | HR 64 | Temp 98.5°F

## 2011-02-17 DIAGNOSIS — N390 Urinary tract infection, site not specified: Secondary | ICD-10-CM

## 2011-02-17 LAB — POCT URINALYSIS DIPSTICK
Bilirubin, UA: NEGATIVE
Glucose, UA: NEGATIVE
Ketones, UA: NEGATIVE
Spec Grav, UA: 1.02
pH, UA: 7

## 2011-02-17 MED ORDER — CIPROFLOXACIN HCL 500 MG PO TABS: 500.0000 mg | ORAL_TABLET | Freq: Two times a day (BID) | ORAL | Status: AC

## 2011-02-17 NOTE — Progress Notes (Signed)
HPI: complains of UTI symptoms Onset 5 days ago, progressively worse associated with dysuria and small volume voiding with increased frequency denies hematuria, flank pain or fever The patient has a history of prior UTI  PMH: reviewed  ROS:  Gen.: No unexpected weight change, no night sweats Lungs: No cough or shortness of breath Cardiovascular: No palpitations or chest pain  PE: BP 130/72  Pulse 64  Temp(Src) 98.5 F (36.9 C) (Oral)  SpO2 94% General: No acute distress Lungs: Clear to auscultation Cardiovascular: Regular rate rhythm, no edema Abdomen: Mild to moderate discomfort of her suprapubic region, no flank tenderness to palpation  Lab Results  Component Value Date   WBC 6.3 02/22/2010   HGB 13.5 02/22/2010   HCT 38.7 02/22/2010   PLT 201.0 02/22/2010   GLUCOSE 93 02/22/2010   CHOL 204* 02/22/2010   TRIG 116.0 02/22/2010   HDL 53.60 02/22/2010   LDLDIRECT 131.5 02/22/2010   LDLCALC 115 09/21/2007   LDLCALC 25 09/21/2007   ALT 12 02/22/2010   AST 19 02/22/2010   NA 141 02/22/2010   K 3.7 02/22/2010   CL 105 02/22/2010   CREATININE 0.8 02/22/2010   BUN 14 02/22/2010   CO2 31 02/22/2010   TSH 1.46 02/22/2010    Assessment/Plan: UTI, classic symptoms with history of same  Empiric antibiotic x7 days Hydration recommended, education provided

## 2011-02-17 NOTE — Patient Instructions (Signed)
It was good to see you today. Cipro antibiotics for UTI symptoms - Your prescription(s) have been submitted to your pharmacy. Please take as directed and contact our office if you believe you are having problem(s) with the medication(s). Urinary Tract Infection Infections of the urinary tract can start in several places. A bladder infection (cystitis), a kidney infection (pyelonephritis), and a prostate infection (prostatitis) are different types of urinary tract infections (UTIs). They usually get better if treated with medicines (antibiotics) that kill germs. Take all the medicine until it is gone. You or your child may feel better in a few days, but TAKE ALL MEDICINE or the infection may not respond and may become more difficult to treat. HOME CARE INSTRUCTIONS    Drink enough water and fluids to keep the urine clear or pale yellow. Cranberry juice is especially recommended, in addition to large amounts of water.     Avoid caffeine, tea, and carbonated beverages. They tend to irritate the bladder.     Alcohol may irritate the prostate.     Only take over-the-counter or prescription medicines for pain, discomfort, or fever as directed by your caregiver.  To prevent further infections:  Empty the bladder often. Avoid holding urine for long periods of time.     After a bowel movement, women should cleanse from front to back. Use each tissue only once.     Empty the bladder before and after sexual intercourse.  FINDING OUT THE RESULTS OF YOUR TEST Not all test results are available during your visit. If your or your child's test results are not back during the visit, make an appointment with your caregiver to find out the results. Do not assume everything is normal if you have not heard from your caregiver or the medical facility. It is important for you to follow up on all test results. SEEK MEDICAL CARE IF:    There is back pain.     Your baby is older than 3 months with a rectal  temperature of 100.5 F (38.1 C) or higher for more than 1 day.     Your or your child's problems (symptoms) are no better in 3 days. Return sooner if you or your child is getting worse.  SEEK IMMEDIATE MEDICAL CARE IF:    There is severe back pain or lower abdominal pain.     You or your child develops chills.     You have a fever.     Your baby is older than 3 months with a rectal temperature of 102 F (38.9 C) or higher.     Your baby is 71 months old or younger with a rectal temperature of 100.4 F (38 C) or higher.     There is nausea or vomiting.     There is continued burning or discomfort with urination.  MAKE SURE YOU:    Understand these instructions.     Will watch your condition.     Will get help right away if you are not doing well or get worse.  Document Released: 10/09/2004 Document Revised: 09/11/2010 Document Reviewed: 05/14/2006 [facility] Patient Information 2012 [place], [place].

## 2011-03-03 ENCOUNTER — Other Ambulatory Visit: Payer: Self-pay | Admitting: Internal Medicine

## 2011-03-17 ENCOUNTER — Ambulatory Visit (INDEPENDENT_AMBULATORY_CARE_PROVIDER_SITE_OTHER): Payer: BC Managed Care – PPO | Admitting: Internal Medicine

## 2011-03-17 ENCOUNTER — Encounter: Payer: Self-pay | Admitting: Internal Medicine

## 2011-03-17 ENCOUNTER — Other Ambulatory Visit (INDEPENDENT_AMBULATORY_CARE_PROVIDER_SITE_OTHER): Payer: BC Managed Care – PPO

## 2011-03-17 VITALS — BP 120/62 | HR 62 | Temp 97.9°F | Ht 62.0 in | Wt 164.4 lb

## 2011-03-17 DIAGNOSIS — Z23 Encounter for immunization: Secondary | ICD-10-CM

## 2011-03-17 DIAGNOSIS — Z0000 Encounter for general adult medical examination without abnormal findings: Secondary | ICD-10-CM

## 2011-03-17 DIAGNOSIS — L259 Unspecified contact dermatitis, unspecified cause: Secondary | ICD-10-CM

## 2011-03-17 DIAGNOSIS — Z1211 Encounter for screening for malignant neoplasm of colon: Secondary | ICD-10-CM

## 2011-03-17 DIAGNOSIS — Z13820 Encounter for screening for osteoporosis: Secondary | ICD-10-CM

## 2011-03-17 LAB — URINALYSIS, ROUTINE W REFLEX MICROSCOPIC
Specific Gravity, Urine: 1.015 (ref 1.000–1.030)
Urine Glucose: NEGATIVE
Urobilinogen, UA: 0.2 (ref 0.0–1.0)
pH: 6.5 (ref 5.0–8.0)

## 2011-03-17 LAB — HEPATIC FUNCTION PANEL
Albumin: 4.1 g/dL (ref 3.5–5.2)
Total Bilirubin: 0.8 mg/dL (ref 0.3–1.2)

## 2011-03-17 LAB — CBC WITH DIFFERENTIAL/PLATELET
Basophils Absolute: 0.1 10*3/uL (ref 0.0–0.1)
Basophils Relative: 0.9 % (ref 0.0–3.0)
Eosinophils Absolute: 0.1 10*3/uL (ref 0.0–0.7)
HCT: 40 % (ref 36.0–46.0)
Hemoglobin: 13.4 g/dL (ref 12.0–15.0)
Lymphocytes Relative: 26.7 % (ref 12.0–46.0)
Lymphs Abs: 1.8 10*3/uL (ref 0.7–4.0)
MCHC: 33.6 g/dL (ref 30.0–36.0)
Monocytes Relative: 9.7 % (ref 3.0–12.0)
Neutro Abs: 4 10*3/uL (ref 1.4–7.7)
RBC: 4.6 Mil/uL (ref 3.87–5.11)
RDW: 13 % (ref 11.5–14.6)

## 2011-03-17 LAB — LIPID PANEL
Cholesterol: 228 mg/dL — ABNORMAL HIGH (ref 0–200)
HDL: 64.2 mg/dL (ref >39.00)
Total CHOL/HDL Ratio: 4
Triglycerides: 132 mg/dL (ref 0.0–149.0)
VLDL: 26.4 mg/dL (ref 0.0–40.0)

## 2011-03-17 LAB — BASIC METABOLIC PANEL
CO2: 30 mEq/L (ref 19–32)
Calcium: 10.1 mg/dL (ref 8.4–10.5)
Glucose, Bld: 97 mg/dL (ref 70–99)
Potassium: 3.7 mEq/L (ref 3.5–5.1)
Sodium: 140 mEq/L (ref 135–145)

## 2011-03-17 MED ORDER — BETAMETHASONE DIPROPIONATE AUG 0.05 % EX CREA: TOPICAL_CREAM | Freq: Two times a day (BID) | CUTANEOUS | Status: DC

## 2011-03-17 NOTE — Assessment & Plan Note (Signed)
Chronic hx same - periodically evaluated by dermatology for same Will prescribe stronger potency topical steroid for treatment of itch pending dermatology review -  reviewed importance of avoiding face use of this prescription - she understands and agrees

## 2011-03-17 NOTE — Patient Instructions (Signed)
It was good to see you today. Test(s) ordered today. Your results will be called to you after review (48-72hours after test completion). If any changes need to be made, you will be notified at that time. Use new prescription steroid cream for your leg rash and until followup with dermatology as planned - .vaer Paperwork completed for your physical insurance as requested Health Maintenance reviewed - Pneumovax given. Complete stool cards as requested to screen for colon cancer in place of colonoscopy per your request and preference. Other immunizations and health screening recommendations are up-to-date  we'll make referral to breast Center for your bone density scan (try to coordinate timing with mammogram as scheduled). Our office will contact you regarding appointment(s) once made. Please schedule followup in 12 months for physical and labs, call sooner if problems.

## 2011-03-17 NOTE — Progress Notes (Signed)
Subjective:    Patient ID: Deanna Buck, female    DOB: [birth year], 66 y.o.   MRN: [id number]  HPI   Here for annual wellness exam, physical  Diet: heart healthy Physical activity: indep, active Depression/mood screen: negative Hearing: intact to whispered voice Visual acuity: grossly normal, performs annual eye exam  ADLs: capable Fall risk: none Home safety: good Cognitive evaluation: intact to orientation, naming, recall and repetition EOL planning: adv directives, full code/ I agree  I have personally reviewed and have noted 1. The patient's medical and social history 2. Their use of alcohol, tobacco or illicit drugs 3. Their current medications and supplements 4. The patient's functional ability including ADL's, fall risks, home safety risks and hearing or visual impairment. 5. Diet and physical activities 6. Evidence for depression or mood disorders   also reviewed chronic medical issues:   anxiety - takes as needed klonopin to help tol spouse's mood swings, reports infrequent use; also on nadolol for same since mid90's + stress at home in relationship with spouse, as well as at work. has never been treated for depression or anxiety. Specifically years ago, went to therapy, counseling, when husband was cheating on her. Feels counseling at that time was not helpful. reviewed and denies suicidal or homicidal ideation   hypertension? - pt denies hx same but noted in chart review from prior md records. Patient attributes elevated blood pressure due to stress. Takes nadolol for stress as stated above, and hydrochlorothiazide for control of the swelling   abn mammo due to L breast cysts - no cancer - has been on q 66mo mammo since '07 until oct 09 - now annual -due q oct ; +fh breast ca in mom   recurrent outbreak shingles L neck - takes suppressive valtrex for same - no current symptoms   eczema , chronic recurring "itchy" rash, attributed to tanning booth skin injuries  -periodic rash outbreaks - currently on BLE shins, knees down - gets worse with stress and weather changes- controls with steroid cream as needed - but less effective relief now , ?? Alternate medication  Past Medical History  Diagnosis Date  . ECZEMA   . PERIPHERAL EDEMA   . ANXIETY   . GERD   . HYPERTENSION   . OA (osteoarthritis)    Family History  Problem Relation Age of Onset  . Breast cancer Mother   . Dementia Mother   . Arthritis Other   . Hypertension Other     parent  . Hyperlipidemia Other     parent  . Kidney disease Other     parent   History  Substance Use Topics  . Smoking status: Never Smoker   . Smokeless tobacco: Not on file   Comment: Married, but in stressful relationship with spouse  . Alcohol Use: No    Review of Systems Constitutional: Negative for fever or weight change.  Respiratory: Negative for cough and shortness of breath.   Cardiovascular: Negative for chest pain or palpitations.  Gastrointestinal: Negative for abdominal pain, no bowel changes.  Musculoskeletal: Negative for gait problem or joint swelling.  Skin: Negative for wound - rash as noted above.  Neurological: Negative for dizziness or headache.  No other specific complaints in a complete review of systems (except as listed in HPI above).     Objective:   Physical Exam BP 120/62  Pulse 62  Temp(Src) 97.9 F (36.6 C) (Oral)  Ht 5\' 2"  (1.575 m)  Wt 164 lb 6.4 oz (74.571  kg)  BMI 30.07 kg/m2  SpO2 95% Wt Readings from Last 3 Encounters:  03/17/11 164 lb 6.4 oz (74.571 kg)  07/01/10 160 lb 12.8 oz (72.938 kg)  03/01/10 163 lb 12 oz (74.277 kg)   Constitutional: She appears well-developed and well-nourished. No distress.  HENT: Head: Normocephalic and atraumatic. Ears: B TMs ok, no erythema or effusion; Nose: Nose normal. Mouth/Throat: Oropharynx is clear and moist. No oropharyngeal exudate.  Eyes: Conjunctivae and EOM are normal. Pupils are equal, round, and reactive to  light. No scleral icterus.  Neck: Normal range of motion. Neck supple. No JVD present. No thyromegaly present.  Cardiovascular: Normal rate, regular rhythm and normal heart sounds.  No murmur heard. No BLE edema. Pulmonary/Chest: Effort normal and breath sounds normal. No respiratory distress. She has no wheezes.  Abdominal: Soft. Bowel sounds are normal. She exhibits no distension. There is no tenderness. no masses Musculoskeletal: Normal range of motion, no joint effusions. No gross deformities Neurological: She is alert and oriented to person, place, and time. No cranial nerve deficit. Coordination normal.  Skin: Skin is warm and dry. BLE rash knee down shin>calf - chronic eczema changes with excoriation noted. No erythema.  Psychiatric: She has a normal mood and affect. Her behavior is normal. Judgment and thought content normal.   Lab Results  Component Value Date   WBC 6.3 02/22/2010   HGB 13.5 02/22/2010   HCT 38.7 02/22/2010   PLT 201.0 02/22/2010   GLUCOSE 93 02/22/2010   CHOL 204* 02/22/2010   TRIG 116.0 02/22/2010   HDL 53.60 02/22/2010   LDLDIRECT 131.5 02/22/2010   LDLCALC 115 09/21/2007   LDLCALC 25 09/21/2007   ALT 12 02/22/2010   AST 19 02/22/2010   NA 141 02/22/2010   K 3.7 02/22/2010   CL 105 02/22/2010   CREATININE 0.8 02/22/2010   BUN 14 02/22/2010   CO2 31 02/22/2010   TSH 1.46 02/22/2010   EKG: 60 beats per minute, sinus - nonspecific ST depression    Assessment & Plan:  CPX/v70.0 - Today patient counseled on age appropriate routine health concerns for screening and prevention, each reviewed and up to date or declined. Immunizations reviewed and up to date or declined. Labs ordered and/ECG reviewed. Risk factors for depression reviewed and negative. Hearing function and visual acuity are intact. ADLs screened and addressed as needed. Functional ability and level of safety reviewed and appropriate. Education, counseling and referrals performed based on assessed risks today. Patient  provided with a copy of personalized plan for preventive services.

## 2011-03-18 ENCOUNTER — Other Ambulatory Visit: Payer: Self-pay | Admitting: Internal Medicine

## 2011-03-18 ENCOUNTER — Encounter: Payer: Self-pay | Admitting: Internal Medicine

## 2011-03-18 ENCOUNTER — Telehealth: Payer: Self-pay | Admitting: *Deleted

## 2011-03-18 DIAGNOSIS — Z13820 Encounter for screening for osteoporosis: Secondary | ICD-10-CM

## 2011-03-18 DIAGNOSIS — N959 Unspecified menopausal and perimenopausal disorder: Secondary | ICD-10-CM

## 2011-03-18 DIAGNOSIS — Z1231 Encounter for screening mammogram for malignant neoplasm of breast: Secondary | ICD-10-CM

## 2011-03-18 DIAGNOSIS — E785 Hyperlipidemia, unspecified: Secondary | ICD-10-CM | POA: Insufficient documentation

## 2011-03-18 NOTE — Telephone Encounter (Signed)
[facility]uler. Order in system.

## 2011-03-18 NOTE — Telephone Encounter (Signed)
Dx code for Bone Density has to be changed to a specific diagnosis, they cannot accept screening for osteoporosis-insurance will not cover.

## 2011-03-18 NOTE — Telephone Encounter (Signed)
postmenopausal status - i will re-schedule here, [facility] not [facility] - thanks

## 2011-03-18 NOTE — Telephone Encounter (Signed)
Tried to contact [facility] to inform, no answer. Pt called by scheduler to set up appointment for Bone Density here at [facility].

## 2011-03-25 ENCOUNTER — Telehealth: Payer: Self-pay | Admitting: *Deleted

## 2011-03-25 NOTE — Telephone Encounter (Signed)
Left msg on vm Monday need md to change dx code for bone density so that insurance will cover & they will schedule then... 03/25/11@10 :07am/LMB

## 2011-03-25 NOTE — Telephone Encounter (Signed)
Notified pt advise her of situation. Pt agreed to have test done here. Transferred to schedulers to make appt... 03/25/11@3 :02pm/LMB

## 2011-03-25 NOTE — Telephone Encounter (Signed)
The new order was placed 3/5 with correct dx code - i prefer the test be done here, but may order at [facility] if needed. No more new orders from me

## 2011-03-31 ENCOUNTER — Ambulatory Visit (INDEPENDENT_AMBULATORY_CARE_PROVIDER_SITE_OTHER)
Admission: RE | Admit: 2011-03-31 | Discharge: 2011-03-31 | Disposition: A | Discharge status: Home / Self Care | Payer: BC Managed Care – PPO | Source: Ambulatory Visit

## 2011-03-31 DIAGNOSIS — Z13820 Encounter for screening for osteoporosis: Secondary | ICD-10-CM

## 2011-03-31 DIAGNOSIS — N959 Unspecified menopausal and perimenopausal disorder: Secondary | ICD-10-CM

## 2011-04-09 ENCOUNTER — Other Ambulatory Visit: Payer: Self-pay | Admitting: Internal Medicine

## 2011-04-14 ENCOUNTER — Encounter: Payer: Self-pay | Admitting: Internal Medicine

## 2011-04-16 ENCOUNTER — Ambulatory Visit
Admission: RE | Admit: 2011-04-16 | Discharge: 2011-04-16 | Disposition: A | Discharge status: Home / Self Care | Payer: BC Managed Care – PPO | Source: Ambulatory Visit | Attending: Internal Medicine | Admitting: Internal Medicine

## 2011-04-16 DIAGNOSIS — Z1231 Encounter for screening mammogram for malignant neoplasm of breast: Secondary | ICD-10-CM

## 2011-08-11 ENCOUNTER — Other Ambulatory Visit: Payer: Self-pay | Admitting: *Deleted

## 2011-08-11 MED ORDER — NADOLOL 40 MG PO TABS: 20.0000 mg | ORAL_TABLET | Freq: Every day | ORAL | Status: DC

## 2011-09-03 ENCOUNTER — Other Ambulatory Visit: Payer: Self-pay | Admitting: Internal Medicine

## 2011-09-05 ENCOUNTER — Other Ambulatory Visit: Payer: Self-pay | Admitting: Internal Medicine

## 2011-11-13 ENCOUNTER — Ambulatory Visit: Payer: BC Managed Care – PPO

## 2011-11-18 ENCOUNTER — Ambulatory Visit (INDEPENDENT_AMBULATORY_CARE_PROVIDER_SITE_OTHER): Payer: BC Managed Care – PPO

## 2011-11-18 DIAGNOSIS — Z23 Encounter for immunization: Secondary | ICD-10-CM

## 2012-04-16 ENCOUNTER — Other Ambulatory Visit: Payer: Self-pay

## 2012-04-16 DIAGNOSIS — Z1231 Encounter for screening mammogram for malignant neoplasm of breast: Secondary | ICD-10-CM

## 2012-04-19 ENCOUNTER — Telehealth: Payer: Self-pay

## 2012-04-19 DIAGNOSIS — Z0000 Encounter for general adult medical examination without abnormal findings: Secondary | ICD-10-CM

## 2012-04-19 NOTE — Telephone Encounter (Signed)
Lab orders entered

## 2012-05-17 ENCOUNTER — Other Ambulatory Visit: Payer: Self-pay | Admitting: Internal Medicine

## 2012-05-26 ENCOUNTER — Ambulatory Visit
Admission: RE | Admit: 2012-05-26 | Discharge: 2012-05-26 | Disposition: A | Discharge status: Home / Self Care | Payer: BC Managed Care – PPO | Source: Ambulatory Visit

## 2012-05-26 DIAGNOSIS — Z1231 Encounter for screening mammogram for malignant neoplasm of breast: Secondary | ICD-10-CM

## 2012-06-04 ENCOUNTER — Other Ambulatory Visit (INDEPENDENT_AMBULATORY_CARE_PROVIDER_SITE_OTHER): Payer: BC Managed Care – PPO

## 2012-06-04 DIAGNOSIS — Z0000 Encounter for general adult medical examination without abnormal findings: Secondary | ICD-10-CM

## 2012-06-04 LAB — URINALYSIS, ROUTINE W REFLEX MICROSCOPIC
Bilirubin Urine: NEGATIVE
Hgb urine dipstick: NEGATIVE
Leukocytes, UA: NEGATIVE
Nitrite: NEGATIVE
pH: 7.5 (ref 5.0–8.0)

## 2012-06-04 LAB — CBC WITH DIFFERENTIAL/PLATELET
Basophils Relative: 0.9 % (ref 0.0–3.0)
Eosinophils Absolute: 0.1 10*3/uL (ref 0.0–0.7)
HCT: 38.9 % (ref 36.0–46.0)
Hemoglobin: 13.5 g/dL (ref 12.0–15.0)
Lymphocytes Relative: 27.2 % (ref 12.0–46.0)
Lymphs Abs: 1.9 10*3/uL (ref 0.7–4.0)
MCHC: 34.8 g/dL (ref 30.0–36.0)
MCV: 85.1 fl (ref 78.0–100.0)
Neutro Abs: 4 10*3/uL (ref 1.4–7.7)
RBC: 4.57 Mil/uL (ref 3.87–5.11)
RDW: 12.9 % (ref 11.5–14.6)

## 2012-06-04 LAB — BASIC METABOLIC PANEL
BUN: 15 mg/dL (ref 6–23)
Calcium: 9.8 mg/dL (ref 8.4–10.5)
Chloride: 104 mEq/L (ref 96–112)
Creatinine, Ser: 0.8 mg/dL (ref 0.4–1.2)

## 2012-06-04 LAB — LIPID PANEL
HDL: 56.7 mg/dL (ref >39.00)
Total CHOL/HDL Ratio: 4
VLDL: 24.6 mg/dL (ref 0.0–40.0)

## 2012-06-04 LAB — LDL CHOLESTEROL, DIRECT: Direct LDL: 134.7 mg/dL

## 2012-06-04 LAB — HEPATIC FUNCTION PANEL
ALT: 13 U/L (ref 0–35)
Total Bilirubin: 1 mg/dL (ref 0.3–1.2)
Total Protein: 6.9 g/dL (ref 6.0–8.3)

## 2012-06-04 LAB — TSH: TSH: 2.04 u[IU]/mL (ref 0.35–5.50)

## 2012-06-09 ENCOUNTER — Encounter: Payer: Self-pay | Admitting: Internal Medicine

## 2012-06-09 ENCOUNTER — Ambulatory Visit (INDEPENDENT_AMBULATORY_CARE_PROVIDER_SITE_OTHER): Payer: BC Managed Care – PPO | Admitting: Internal Medicine

## 2012-06-09 VITALS — BP 148/82 | HR 63 | Temp 98.1°F | Ht 62.0 in | Wt 169.4 lb

## 2012-06-09 DIAGNOSIS — Z0000 Encounter for general adult medical examination without abnormal findings: Secondary | ICD-10-CM

## 2012-06-09 DIAGNOSIS — I10 Essential (primary) hypertension: Secondary | ICD-10-CM

## 2012-06-09 DIAGNOSIS — E785 Hyperlipidemia, unspecified: Secondary | ICD-10-CM

## 2012-06-09 MED ORDER — NADOLOL 40 MG PO TABS: 20.0000 mg | ORAL_TABLET | Freq: Every day | ORAL | Status: DC

## 2012-06-09 MED ORDER — HYDROCHLOROTHIAZIDE 25 MG PO TABS: 25.0000 mg | ORAL_TABLET | Freq: Every day | ORAL | Status: DC

## 2012-06-09 NOTE — Assessment & Plan Note (Signed)
White coat hypertension On beta-blocker for stress and HCTZ for diuretic -  The current medical regimen is effective;  continue present plan and medications. BP Readings from Last 3 Encounters:  06/09/12 148/82  03/17/11 120/62  02/17/11 130/72

## 2012-06-09 NOTE — Progress Notes (Signed)
Subjective:    Patient ID: Deanna Buck, female    DOB: 12/10/1945, 67 y.o.   MRN: [id number]  HPI  Here for annual wellness exam, physical  Diet: heart healthy Physical activity: indep, active Depression/mood screen: negative Hearing: intact to whispered voice Visual acuity: grossly normal, performs annual eye exam  ADLs: capable Fall risk: none Home safety: good Cognitive evaluation: intact to orientation, naming, recall and repetition EOL planning: adv directives, full code/ I agree  I have personally reviewed and have noted 1. The patient's medical and social history 2. Their use of alcohol, tobacco or illicit drugs 3. Their current medications and supplements 4. The patient's functional ability including ADL's, fall risks, home safety risks and hearing or visual impairment. 5. Diet and physical activities 6. Evidence for depression or mood disorders   also reviewed chronic medical issues:   anxiety - takes as needed klonopin to help tol spouse's mood swings, reports infrequent use; also on nadolol for same since mid90's + stress at home in relationship with spouse, as well as at work. has never been treated for depression or anxiety. Specifically years ago, went to therapy, counseling, when husband was cheating on her. Feels counseling at that time was not helpful. reviewed and denies suicidal or homicidal ideation   Hypertension, white coat - pt denies hx same but previously noted in chart review from prior md records. Patient attributes elevated blood pressure due to stress. Takes nadolol for stress as stated above, and hydrochlorothiazide for control of the swelling    Past Medical History  Diagnosis Date  . ECZEMA   . PERIPHERAL EDEMA   . ANXIETY   . GERD   . HYPERTENSION   . OA (osteoarthritis)    Family History  Problem Relation Age of Onset  . Breast cancer Mother   . Dementia Mother   . Arthritis Other   . Hypertension Other     parent  . Hyperlipidemia  Other     parent  . Kidney disease Other     parent   History  Substance Use Topics  . Smoking status: Never Smoker   . Smokeless tobacco: Not on file     Comment: Married, but in stressful relationship with spouse  . Alcohol Use: No    Review of Systems  Constitutional: Negative for fever or weight change.  Respiratory: Negative for cough and shortness of breath.   Cardiovascular: Negative for chest pain or palpitations.  Gastrointestinal: Negative for abdominal pain, no bowel changes.  Musculoskeletal: Negative for gait problem or joint swelling.  Skin: Negative for wound or rash.  Neurological: Negative for dizziness or headache.  No other specific complaints in a complete review of systems (except as listed in HPI above).     Objective:   Physical Exam  BP 148/82  Pulse 63  Temp(Src) 98.1 F (36.7 C) (Oral)  Ht 5\' 2"  (1.575 m)  Wt 169 lb 6.4 oz (76.839 kg)  BMI 30.98 kg/m2  SpO2 97% Wt Readings from Last 3 Encounters:  06/09/12 169 lb 6.4 oz (76.839 kg)  03/17/11 164 lb 6.4 oz (74.571 kg)  07/01/10 160 lb 12.8 oz (72.938 kg)   Constitutional: She is overweight, but appears well-developed and well-nourished. No distress.  HENT: Head: Normocephalic and atraumatic. Ears: B TMs ok, no erythema or effusion; Nose: Nose normal. Mouth/Throat: Oropharynx is clear and moist. No oropharyngeal exudate.  Eyes: Conjunctivae and EOM are normal. Pupils are equal, round, and reactive to light. No scleral icterus.  Neck: Normal range of motion. Neck supple. No JVD present. No thyromegaly present.  Cardiovascular: Normal rate, regular rhythm and normal heart sounds.  No murmur heard. No BLE edema. Pulmonary/Chest: Effort normal and breath sounds normal. No respiratory distress. She has no wheezes.  Abdominal: Soft. Bowel sounds are normal. She exhibits no distension. There is no tenderness. no masses Musculoskeletal: Normal range of motion, no joint effusions. No gross  deformities Neurological: She is alert and oriented to person, place, and time. No cranial nerve deficit. Coordination normal.  Skin: Skin is warm and dry. BLE rash knee down shin>calf - chronic eczema changes with excoriation noted. No erythema.  Psychiatric: She has a normal mood and affect. Her behavior is normal. Judgment and thought content normal.   Lab Results  Component Value Date   WBC 6.9 06/04/2012   HGB 13.5 06/04/2012   HCT 38.9 06/04/2012   PLT 198.0 06/04/2012   GLUCOSE 89 06/04/2012   CHOL 222* 06/04/2012   TRIG 123.0 06/04/2012   HDL 56.70 06/04/2012   LDLDIRECT 134.7 06/04/2012   LDLCALC 115 09/21/2007   LDLCALC 25 09/21/2007   ALT 13 06/04/2012   AST 18 06/04/2012   NA 139 06/04/2012   K 3.2* 06/04/2012   CL 104 06/04/2012   CREATININE 0.8 06/04/2012   BUN 15 06/04/2012   CO2 30 06/04/2012   TSH 2.04 06/04/2012   EKG: 60 beats per minute, sinus - nonspecific ST depression    Assessment & Plan:  CPX/v70.0 - Today patient counseled on age appropriate routine health concerns for screening and prevention, each reviewed and up to date or declined. Immunizations reviewed and up to date or declined. Labs ordered and/ECG reviewed. Risk factors for depression reviewed and negative. Hearing function and visual acuity are intact. ADLs screened and addressed as needed. Functional ability and level of safety reviewed and appropriate. Education, counseling and referrals performed based on assessed risks today. Patient provided with a copy of personalized plan for preventive services.   Pt continues to decline colonoscopy screening, has stool cards to return  Also See problem list. Medications and labs reviewed today.

## 2012-06-09 NOTE — Assessment & Plan Note (Signed)
Reviewed total and LDL trends Patient to work on diet and exercise to control weight and reduce LDL No prescription changes recommended at this time

## 2012-06-09 NOTE — Patient Instructions (Signed)
It was good to see you today. We have reviewed your prior records including labs and tests today Health Maintenance reviewed - all recommended immunizations and age-appropriate screenings are up-to-date. Consider colonoscopy screening as we discussed Medications reviewed and updated, no changes recommended at this time. Refill on medication(s) as discussed today. Please schedule followup in 12 months for physical and labs, call sooner if problems.  Health Maintenance, Females A healthy lifestyle and preventative care can promote health and wellness.  Maintain regular health, dental, and eye exams.  Eat a healthy diet. Foods like vegetables, fruits, whole grains, low-fat dairy products, and lean protein foods contain the nutrients you need without too many calories. Decrease your intake of foods high in solid fats, added sugars, and salt. Get information about a proper diet from your caregiver, if necessary.  Regular physical exercise is one of the most important things you can do for your health. Most adults should get at least 150 minutes of moderate-intensity exercise (any activity that increases your heart rate and causes you to sweat) each week. In addition, most adults need muscle-strengthening exercises on 2 or more days a week.   Maintain a healthy weight. The body mass index (BMI) is a screening tool to identify possible weight problems. It provides an estimate of body fat based on height and weight. Your caregiver can help determine your BMI, and can help you achieve or maintain a healthy weight. For adults 20 years and older:  A BMI below 18.5 is considered underweight.  A BMI of 18.5 to 24.9 is normal.  A BMI of 25 to 29.9 is considered overweight.  A BMI of 30 and above is considered obese.  Maintain normal blood lipids and cholesterol by exercising and minimizing your intake of saturated fat. Eat a balanced diet with plenty of fruits and vegetables. Blood tests for lipids and  cholesterol should begin at age 49 and be repeated every 5 years. If your lipid or cholesterol levels are high, you are over 50, or you are a high risk for heart disease, you may need your cholesterol levels checked more frequently.Ongoing high lipid and cholesterol levels should be treated with medicines if diet and exercise are not effective.  If you smoke, find out from your caregiver how to quit. If you do not use tobacco, do not start.  If you are pregnant, do not drink alcohol. If you are breastfeeding, be very cautious about drinking alcohol. If you are not pregnant and choose to drink alcohol, do not exceed 1 drink per day. One drink is considered to be 12 ounces (355 mL) of beer, 5 ounces (148 mL) of wine, or 1.5 ounces (44 mL) of liquor.  Avoid use of street drugs. Do not share needles with anyone. Ask for help if you need support or instructions about stopping the use of drugs.  High blood pressure causes heart disease and increases the risk of stroke. Blood pressure should be checked at least every 1 to 2 years. Ongoing high blood pressure should be treated with medicines, if weight loss and exercise are not effective.  If you are 26 to 66 years old, ask your caregiver if you should take aspirin to prevent strokes.  Diabetes screening involves taking a blood sample to check your fasting blood sugar level. This should be done once every 3 years, after age 46, if you are within normal weight and without risk factors for diabetes. Testing should be considered at a younger age or be carried out  more frequently if you are overweight and have at least 1 risk factor for diabetes.  Breast cancer screening is essential preventative care for women. You should practice "breast self-awareness." This means understanding the normal appearance and feel of your breasts and may include breast self-examination. Any changes detected, no matter how small, should be reported to a caregiver. Women in their 69s  and 30s should have a clinical breast exam (CBE) by a caregiver as part of a regular health exam every 1 to 3 years. After age 58, women should have a CBE every year. Starting at age 34, women should consider having a mammogram (breast X-ray) every year. Women who have a family history of breast cancer should talk to their caregiver about genetic screening. Women at a high risk of breast cancer should talk to their caregiver about having an MRI and a mammogram every year.  The Pap test is a screening test for cervical cancer. Women should have a Pap test starting at age 19. Between ages 48 and 36, Pap tests should be repeated every 2 years. Beginning at age 65, you should have a Pap test every 3 years as long as the past 3 Pap tests have been normal. If you had a hysterectomy for a problem that was not cancer or a condition that could lead to cancer, then you no longer need Pap tests. If you are between ages 7 and 46, and you have had normal Pap tests going back 10 years, you no longer need Pap tests. If you have had past treatment for cervical cancer or a condition that could lead to cancer, you need Pap tests and screening for cancer for at least 20 years after your treatment. If Pap tests have been discontinued, risk factors (such as a new sexual partner) need to be reassessed to determine if screening should be resumed. Some women have medical problems that increase the chance of getting cervical cancer. In these cases, your caregiver may recommend more frequent screening and Pap tests.  The human papillomavirus (HPV) test is an additional test that may be used for cervical cancer screening. The HPV test looks for the virus that can cause the cell changes on the cervix. The cells collected during the Pap test can be tested for HPV. The HPV test could be used to screen women aged 18 years and older, and should be used in women of any age who have unclear Pap test results. After the age of 63, women should  have HPV testing at the same frequency as a Pap test.  Colorectal cancer can be detected and often prevented. Most routine colorectal cancer screening begins at the age of 32 and continues through age 88. However, your caregiver may recommend screening at an earlier age if you have risk factors for colon cancer. On a yearly basis, your caregiver may provide home test kits to check for hidden blood in the stool. Use of a small camera at the end of a tube, to directly examine the colon (sigmoidoscopy or colonoscopy), can detect the earliest forms of colorectal cancer. Talk to your caregiver about this at age 6, when routine screening begins. Direct examination of the colon should be repeated every 5 to 10 years through age 56, unless early forms of pre-cancerous polyps or small growths are found.  Hepatitis C blood testing is recommended for all people born from [birth year] through 1964 and any individual with known risks for hepatitis C.  Practice safe sex. Use condoms and avoid  high-risk sexual practices to reduce the spread of sexually transmitted infections (STIs). Sexually active women aged 75 and younger should be checked for Chlamydia, which is a common sexually transmitted infection. Older women with new or multiple partners should also be tested for Chlamydia. Testing for other STIs is recommended if you are sexually active and at increased risk.  Osteoporosis is a disease in which the bones lose minerals and strength with aging. This can result in serious bone fractures. The risk of osteoporosis can be identified using a bone density scan. Women ages 42 and over and women at risk for fractures or osteoporosis should discuss screening with their caregivers. Ask your caregiver whether you should be taking a calcium supplement or vitamin D to reduce the rate of osteoporosis.  Menopause can be associated with physical symptoms and risks. Hormone replacement therapy is available to decrease symptoms and  risks. You should talk to your caregiver about whether hormone replacement therapy is right for you.  Use sunscreen with a sun protection factor (SPF) of 30 or greater. Apply sunscreen liberally and repeatedly throughout the day. You should seek shade when your shadow is shorter than you. Protect yourself by wearing long sleeves, pants, a wide-brimmed hat, and sunglasses year round, whenever you are outdoors.  Notify your caregiver of new moles or changes in moles, especially if there is a change in shape or color. Also notify your caregiver if a mole is larger than the size of a pencil eraser.  Stay current with your immunizations. Document Released: 07/15/2010 Document Revised: 03/24/2011 Document Reviewed: 07/15/2010 [facility] Patient Information 2014 [place], [place].

## 2012-10-04 ENCOUNTER — Ambulatory Visit (INDEPENDENT_AMBULATORY_CARE_PROVIDER_SITE_OTHER): Payer: BC Managed Care – PPO | Admitting: Internal Medicine

## 2012-10-04 ENCOUNTER — Encounter: Payer: Self-pay | Admitting: Internal Medicine

## 2012-10-04 VITALS — BP 132/78 | HR 60 | Temp 98.4°F

## 2012-10-04 DIAGNOSIS — M5430 Sciatica, unspecified side: Secondary | ICD-10-CM

## 2012-10-04 DIAGNOSIS — M25551 Pain in right hip: Secondary | ICD-10-CM

## 2012-10-04 DIAGNOSIS — M25559 Pain in unspecified hip: Secondary | ICD-10-CM

## 2012-10-04 DIAGNOSIS — M5432 Sciatica, left side: Secondary | ICD-10-CM

## 2012-10-04 DIAGNOSIS — Z23 Encounter for immunization: Secondary | ICD-10-CM

## 2012-10-04 MED ORDER — DICLOFENAC SODIUM 75 MG PO TBEC: 75.0000 mg | DELAYED_RELEASE_TABLET | Freq: Two times a day (BID) | ORAL | Status: DC

## 2012-10-04 MED ORDER — TIZANIDINE HCL 4 MG PO CAPS: 4.0000 mg | ORAL_CAPSULE | Freq: Three times a day (TID) | ORAL | Status: DC | PRN

## 2012-10-04 NOTE — Patient Instructions (Addendum)
It was good to see you today. We have reviewed your prior records including labs and tests today Generic Voltaren twice a day for inflammation to treat sciatica pain, also use Zanaflex as needed for muscle relaxer - Your prescription(s) have been submitted to your pharmacy. Please take as directed and contact our office if you believe you are having problem(s) with the medication(s). See below for demonstration of home back exercises- begin these exercises in the next 4-5 days as your pain symptoms improve Call if symptoms worse or unimproved in next 2-4 weeks  Back Exercises Back exercises help treat and prevent back injuries. The goal of back exercises is to increase the strength of your abdominal and back muscles and the flexibility of your back. These exercises should be started when you no longer have back pain. Back exercises include:  Pelvic Tilt. Lie on your back with your knees bent. Tilt your pelvis until the lower part of your back is against the floor. Hold this position 5 to 10 sec and repeat 5 to 10 times.  Knee to Chest. Pull first 1 knee up against your chest and hold for 20 to 30 seconds, repeat this with the other knee, and then both knees. This may be done with the other leg straight or bent, whichever feels better.  Sit-Ups or Curl-Ups. Bend your knees 90 degrees. Start with tilting your pelvis, and do a partial, slow sit-up, lifting your trunk only 30 to 45 degrees off the floor. Take at least 2 to 3 seconds for each sit-up. Do not do sit-ups with your knees out straight. If partial sit-ups are difficult, simply do the above but with only tightening your abdominal muscles and holding it as directed.  Hip-Lift. Lie on your back with your knees flexed 90 degrees. Push down with your feet and shoulders as you raise your hips a couple inches off the floor; hold for 10 seconds, repeat 5 to 10 times.  Back arches. Lie on your stomach, propping yourself up on bent elbows. Slowly press  on your hands, causing an arch in your low back. Repeat 3 to 5 times. Any initial stiffness and discomfort should lessen with repetition over time.  Shoulder-Lifts. Lie face down with arms beside your body. Keep hips and torso pressed to floor as you slowly lift your head and shoulders off the floor. Do not overdo your exercises, especially in the beginning. Exercises may cause you some mild back discomfort which lasts for a few minutes; however, if the pain is more severe, or lasts for more than 15 minutes, do not continue exercises until you see your caregiver. Improvement with exercise therapy for back problems is slow.  See your caregivers for assistance with developing a proper back exercise program. Document Released: 02/07/2004 Document Revised: 03/24/2011 Document Reviewed: 10/31/2010 [facility] Patient Information 2014 [place], [place]. Sciatica Sciatica is pain, weakness, numbness, or tingling along your sciatic nerve. The nerve starts in the lower back and runs down the back of each leg. Nerve damage or certain conditions pinch or put pressure on the sciatic nerve. This causes the pain, weakness, and other discomforts of sciatica. HOME CARE   Only take medicine as told by your doctor.  Apply ice to the affected area for 20 minutes. Do this 3 4 times a day for the first 48 72 hours. Then try heat in the same way.  Exercise, stretch, or do your usual activities if these do not make your pain worse.  Go to physical therapy  as told by your doctor.  Keep all doctor visits as told.  Do not wear high heels or shoes that are not supportive.  Get a firm mattress if your mattress is too soft to lessen pain and discomfort. GET HELP RIGHT AWAY IF:   You cannot control when you poop (bowel movement) or pee (urinate).  You have more weakness in your lower back, lower belly (pelvis), butt (buttocks), or legs.  You have redness or puffiness (swelling) of your back.  You have a burning feeling  when you pee.  You have pain that gets worse when you lie down.  You have pain that wakes you from your sleep.  Your pain is worse than past pain.  Your pain lasts longer than 4 weeks.  You are suddenly losing weight without reason. MAKE SURE YOU:   Understand these instructions.  Will watch this condition.  Will get help right away if you are not doing well or get worse. Document Released: 10/09/2007 Document Revised: 07/01/2011 Document Reviewed: 05/11/2011 [facility] Patient Information 2014 [place], [place].

## 2012-10-04 NOTE — Progress Notes (Signed)
  Subjective:    Patient ID: Deanna Buck, female    DOB: Aug 20, 1945, 67 y.o.   MRN: [id number]  HPI Complains of left leg pain. Onset 3-4 weeks ago following increase in exertional activity/overuse. History of same in 2007 related to "pinched nerve" -previously resolved spontaneously over time without medication or surgical/therapeutic intervention. Has been taking over-the-counter anti-inflammatories ibuprofen and aspirin with moderate relief. Denies associated leg weakness, numbness. No falls or balance problems  Past Medical History  Diagnosis Date  . ECZEMA   . PERIPHERAL EDEMA   . ANXIETY   . GERD   . HYPERTENSION   . OA (osteoarthritis)     Review of Systems  Constitutional: Negative for fever, fatigue and unexpected weight change.  Musculoskeletal: Positive for back pain. Negative for myalgias and joint swelling.  Neurological: Negative for tremors and weakness.       Objective:   Physical Exam BP 132/78  Pulse 60  Temp(Src) 98.4 F (36.9 C) (Oral)  SpO2 94% Wt Readings from Last 3 Encounters:  06/09/12 169 lb 6.4 oz (76.839 kg)  03/17/11 164 lb 6.4 oz (74.571 kg)  07/01/10 160 lb 12.8 oz (72.938 kg)   Constitutional: She appears well-developed and well-nourished. No distress.  Cardiovascular: Normal rate, regular rhythm and normal heart sounds.  No murmur heard. No BLE edema. Pulmonary/Chest: Effort normal and breath sounds normal. No respiratory distress. She has no wheezes.  Musculoskeletal: Back: full range of motion of thoracic and lumbar spine. Non tender to palpation. Negative straight leg raise. DTR's are symmetrically intact. Sensation intact in all dermatomes of the lower extremities. Full strength to manual muscle testing. patient is able to heel toe walk without difficulty and ambulates with antalgic gait.  Skin: Skin is warm and dry. No rash noted. No erythema.  Psychiatric: She has a normal mood and affect. Her behavior is normal. Judgment and thought  content normal.   Lab Results  Component Value Date   WBC 6.9 06/04/2012   HGB 13.5 06/04/2012   HCT 38.9 06/04/2012   PLT 198.0 06/04/2012   GLUCOSE 89 06/04/2012   CHOL 222* 06/04/2012   TRIG 123.0 06/04/2012   HDL 56.70 06/04/2012   LDLDIRECT 134.7 06/04/2012   LDLCALC 115 09/21/2007   LDLCALC 25 09/21/2007   ALT 13 06/04/2012   AST 18 06/04/2012   NA 139 06/04/2012   K 3.2* 06/04/2012   CL 104 06/04/2012   CREATININE 0.8 06/04/2012   BUN 15 06/04/2012   CO2 30 06/04/2012   TSH 2.04 06/04/2012   MRI L spine 06/2005: IMPRESSION:  1. Moderate multifactorial spinal stenosis at L4-5 with mild narrowing of both lateral recesses.  2. Lesser disk bulging and facet disease at L2-3 and L3-4 without resulting spinal stenosis or definite nerve root encroachment. There is mild narrowing of the left lateral recess at L3-4.  3. Mild facet hypertrophy at L5-S1 without resulting foraminal compromise or nerve root encroachment.       Assessment & Plan:   L sciatica - hx same 2007 No red flags on hx/exam Declines pred taper tx Voltaren 75mg  bid x 10d and muscle relaxer zanaflex Home back exercise provided  Bilateral "hip" pain -onset corresponding with sciatica symptoms as above. Symptoms markedly improved with over-the-counter ibuprofen or Tylenol. Advised to continued same and call if symptoms worse or unimproved as sciatica resolves. No groin pain or hip/leg problems prior to onset of sciatica symptoms

## 2012-10-11 ENCOUNTER — Other Ambulatory Visit: Payer: Self-pay | Admitting: Internal Medicine

## 2012-11-11 ENCOUNTER — Encounter: Payer: Self-pay | Admitting: Internal Medicine

## 2012-11-11 ENCOUNTER — Ambulatory Visit (INDEPENDENT_AMBULATORY_CARE_PROVIDER_SITE_OTHER): Payer: BC Managed Care – PPO | Admitting: Internal Medicine

## 2012-11-11 VITALS — BP 138/70 | HR 66 | Temp 98.3°F | Wt 172.4 lb

## 2012-11-11 DIAGNOSIS — K1120 Sialoadenitis, unspecified: Secondary | ICD-10-CM

## 2012-11-11 MED ORDER — DICLOFENAC SODIUM 75 MG PO TBEC: 75.0000 mg | DELAYED_RELEASE_TABLET | Freq: Two times a day (BID) | ORAL | Status: DC

## 2012-11-11 NOTE — Progress Notes (Signed)
Pre-visit discussion using our clinic review tool. No additional management support is needed unless otherwise documented below in the visit note.  

## 2012-11-11 NOTE — Progress Notes (Signed)
  Subjective:    Patient ID: Deanna Buck, female    DOB: 16-May-1945, 67 y.o.   MRN: [id number]  HPI  Complains of swelling of her left-side of neck Onset 5 days ago Initially noted mild tenderness, now increased swelling over past 48 hours No pain swallowing. No ear pain. No trauma. No cough  Past Medical History  Diagnosis Date  . ECZEMA   . PERIPHERAL EDEMA   . ANXIETY   . GERD   . HYPERTENSION   . OA (osteoarthritis)     Review of Systems  Constitutional: Positive for fatigue. Negative for fever.  HENT: Positive for facial swelling (L jaw/neck). Negative for dental problem, drooling, ear pain and mouth sores.   Eyes: Negative for pain.  Respiratory: Negative for cough and shortness of breath.        Objective:   Physical Exam BP 138/70  Pulse 66  Temp(Src) 98.3 F (36.8 C) (Oral)  Wt 172 lb 6.4 oz (78.2 kg)  BMI 31.52 kg/m2  SpO2 95% Wt Readings from Last 3 Encounters:  11/11/12 172 lb 6.4 oz (78.2 kg)  06/09/12 169 lb 6.4 oz (76.839 kg)  03/17/11 164 lb 6.4 oz (74.571 kg)   Constitutional: She appears well-developed and well-nourished. No distress HENT: left-sided salivary gland tenderness and enlargement at corner of jaw - massage provided, no salivary exudate expressed. Dentures intact, no gum inflammation or oral lesion. Ears bilaterally clear without effusion or erythema -no TMJ click or pain Eye: PERRL, no conjunctivitis Neck: no lymphadenopathy, FROM  Cardiovascular: Normal rate, regular rhythm and normal heart sounds.  No murmur heard. No BLE edema. Pulmonary/Chest: Effort normal and breath sounds normal. No respiratory distress. She has no wheezes.  Skin: Skin is warm and dry. No rash noted. No erythema.  Psychiatric: She has a normal mood and affect. Her behavior is normal. Judgment and thought content normal.   Lab Results  Component Value Date   WBC 6.9 06/04/2012   HGB 13.5 06/04/2012   HCT 38.9 06/04/2012   PLT 198.0 06/04/2012   GLUCOSE 89  06/04/2012   CHOL 222* 06/04/2012   TRIG 123.0 06/04/2012   HDL 56.70 06/04/2012   LDLDIRECT 134.7 06/04/2012   LDLCALC 115 09/21/2007   LDLCALC 25 09/21/2007   ALT 13 06/04/2012   AST 18 06/04/2012   NA 139 06/04/2012   K 3.2* 06/04/2012   CL 104 06/04/2012   CREATININE 0.8 06/04/2012   BUN 15 06/04/2012   CO2 30 06/04/2012   TSH 2.04 06/04/2012       Assessment & Plan:   L sialadenitis - education and reassurance provided Sour hard sugarless candy advised Pt will call if unimproved in next 72h to check ultrasound

## 2012-11-11 NOTE — Patient Instructions (Signed)
Salivary Gland Infection  A salivary gland infection can be caused by a virus, bacteria from the mouth, or a stone. Mumps and other viruses may settle in one or more of the saliva glands. This will result in swelling, pain, and difficulty eating. Bacteria may cause a more severe infection in a salivary gland. A salivary stone blocking the flow of saliva can make this worse. These infections may be related to other medical problems. Some of these are dehydration, recent surgery, poor nutrition, and some medications.  TREATMENT   Treatment of a salivary gland infection depends on the cause. Mumps and other virus infections do not require antibiotics. If bacteria cause the infection, then antibiotics are needed to get rid of the infection. If there is a salivary stone blocking the duct, minor surgery to remove the stone may be needed.   HOME CARE INSTRUCTIONS    Get plenty of rest, increase your fluids, and use warm compresses on the swollen area for 15 to 20 minutes 4 times per day or as often as feels good to you.   Suck on hard candy or chew sugarless gum to promote saliva production.   Only take over-the-counter or prescription medicines for pain, discomfort, or fever as directed by your caregiver.  SEEK IMMEDIATE MEDICAL CARE IF:    You have increased swelling or pain or pain not relieved with medications.   You develop chills or a fever.   Any of your problems are getting worse rather than better.  Document Released: 02/07/2004 Document Revised: 03/24/2011 Document Reviewed: 12/30/2004  [employer] Patient Information 2014 [employer].

## 2012-11-14 ENCOUNTER — Encounter: Payer: Self-pay | Admitting: Internal Medicine

## 2012-11-15 NOTE — Telephone Encounter (Signed)
Called pt she stated she feel better now. On Sat she was having some pain on (R) side rib cage ? If it was her gallbladder but symptom subsided later on in the day. Didn;t have any symptoms on Sunday, and is feel ok today. Will monitor her sxs if they come back will make appt for evaluation...Raechel Chute

## 2012-11-17 ENCOUNTER — Encounter: Payer: Self-pay | Admitting: Internal Medicine

## 2012-11-17 ENCOUNTER — Ambulatory Visit (INDEPENDENT_AMBULATORY_CARE_PROVIDER_SITE_OTHER): Payer: BC Managed Care – PPO | Admitting: Internal Medicine

## 2012-11-17 VITALS — BP 132/70 | HR 66 | Temp 98.6°F

## 2012-11-17 DIAGNOSIS — R1011 Right upper quadrant pain: Secondary | ICD-10-CM

## 2012-11-17 NOTE — Progress Notes (Signed)
Pre-visit discussion using our clinic review tool. No additional management support is needed unless otherwise documented below in the visit note.  

## 2012-11-17 NOTE — Progress Notes (Signed)
  Subjective:    Patient ID: Deanna Buck, female    DOB: Jan 05, 1946, 67 y.o.   MRN: [id number]  Abdominal Pain This is a new problem. The current episode started in the past 7 days. The onset quality is sudden. The problem occurs intermittently. The problem has been resolved. The pain is located in the RUQ. The pain is moderate. The quality of the pain is colicky, aching and a sensation of fullness. The abdominal pain radiates to the back. Pertinent negatives include no anorexia, belching, constipation, diarrhea, dysuria, fever, headaches, hematochezia, melena, nausea, vomiting or weight loss. The pain is aggravated by eating. The pain is relieved by liquids. She has tried nothing for the symptoms. The treatment provided mild relief. There is no history of abdominal surgery, gallstones, GERD or pancreatitis.   Past Medical History  Diagnosis Date  . ECZEMA   . PERIPHERAL EDEMA   . ANXIETY   . GERD   . HYPERTENSION   . OA (osteoarthritis)     Review of Systems  Constitutional: Negative for fever and weight loss.  Gastrointestinal: Positive for abdominal pain. Negative for nausea, vomiting, diarrhea, constipation, melena, hematochezia and anorexia.  Genitourinary: Negative for dysuria.  Neurological: Negative for headaches.       Objective:   Physical Exam BP 132/70  Pulse 66  Temp(Src) 98.6 F (37 C) (Oral)  SpO2 95% Wt Readings from Last 3 Encounters:  11/11/12 172 lb 6.4 oz (78.2 kg)  06/09/12 169 lb 6.4 oz (76.839 kg)  03/17/11 164 lb 6.4 oz (74.571 kg)   Constitutional: She appears well-developed and well-nourished. No distress. Cardiovascular: Normal rate, regular rhythm and normal heart sounds.  No murmur heard. No BLE edema. Pulmonary/Chest: Effort normal and breath sounds normal. No respiratory distress. She has no wheezes.  Abdominal: Soft. Bowel sounds are normal. She exhibits no distension. There is no reproducible tenderness over RUQ. no masses Skin: Skin is warm and  dry. No rash noted. No erythema.  Psychiatric: She has a normal mood and affect. Her behavior is normal. Judgment and thought content normal.   Lab Results  Component Value Date   WBC 6.9 06/04/2012   HGB 13.5 06/04/2012   HCT 38.9 06/04/2012   PLT 198.0 06/04/2012   GLUCOSE 89 06/04/2012   CHOL 222* 06/04/2012   TRIG 123.0 06/04/2012   HDL 56.70 06/04/2012   LDLDIRECT 134.7 06/04/2012   LDLCALC 115 09/21/2007   LDLCALC 25 09/21/2007   ALT 13 06/04/2012   AST 18 06/04/2012   NA 139 06/04/2012   K 3.2* 06/04/2012   CL 104 06/04/2012   CREATININE 0.8 06/04/2012   BUN 15 06/04/2012   CO2 30 06/04/2012   TSH 2.04 06/04/2012         Assessment & Plan:   RUQ colic pain - check [place] to eval for ?GB stones Currently no symptoms  Education provided on same potential - no need for surg if asymptomatic

## 2012-11-17 NOTE — Patient Instructions (Addendum)
It was good to see you today.  Medications reviewed and updated, no changes recommended at this time.  we'll make referral for ultrasound. Our office will contact you regarding appointment(s) once made. Your results will be released to MyChart (or called to you) after review, usually within 72hours after test completion. If any changes need to be made, you will be notified at that same time.  Cholelithiasis Cholelithiasis (also called gallstones) is a form of gallbladder disease. The gallbladder is a small organ that helps you digest fats. Symptoms of gallstones are:  Feeling sick to your stomach (nausea).  Throwing up (vomiting).  Belly pain.  Yellowing of the skin (jaundice).  Sudden pain. You may feel the pain for minutes to hours.  Fever.  Pain to the touch. HOME CARE  Only take medicines as told by your doctor.  Eat a low-fat diet until you see your doctor again. Eating fat can result in pain.  Follow up with your doctor as told. Attacks usually happen time after time. Surgery is usually needed for permanent treatment. GET HELP RIGHT AWAY IF:   Your pain gets worse.  Your pain is not helped by medicines.  You have a fever and lasting symptoms for more than 2 3 days.  You have a fever and your symptoms suddenly get worse.  You keep feeling sick to your stomach and throwing up. MAKE SURE YOU:   Understand these instructions.  Will watch your condition.  Will get help right away if you are not doing well or get worse. Document Released: 06/18/2007 Document Revised: 09/01/2012 Document Reviewed: 06/23/2012 [facility] Patient Information 2014 [place], [place].

## 2012-11-22 ENCOUNTER — Ambulatory Visit
Admission: RE | Admit: 2012-11-22 | Discharge: 2012-11-22 | Disposition: A | Discharge status: Home / Self Care | Payer: BC Managed Care – PPO | Source: Ambulatory Visit | Attending: Internal Medicine | Admitting: Internal Medicine

## 2012-11-22 ENCOUNTER — Encounter: Payer: Self-pay | Admitting: Internal Medicine

## 2012-11-22 DIAGNOSIS — R1011 Right upper quadrant pain: Secondary | ICD-10-CM

## 2012-12-04 ENCOUNTER — Encounter: Payer: Self-pay | Admitting: Internal Medicine

## 2012-12-06 MED ORDER — AZITHROMYCIN 250 MG PO TABS: ORAL_TABLET | ORAL | Status: DC

## 2012-12-12 ENCOUNTER — Encounter: Payer: Self-pay | Admitting: Internal Medicine

## 2012-12-15 ENCOUNTER — Encounter: Payer: Self-pay | Admitting: Internal Medicine

## 2012-12-15 ENCOUNTER — Ambulatory Visit (INDEPENDENT_AMBULATORY_CARE_PROVIDER_SITE_OTHER): Payer: BC Managed Care – PPO | Admitting: Internal Medicine

## 2012-12-15 ENCOUNTER — Ambulatory Visit (INDEPENDENT_AMBULATORY_CARE_PROVIDER_SITE_OTHER)
Admission: RE | Admit: 2012-12-15 | Discharge: 2012-12-15 | Disposition: A | Discharge status: Home / Self Care | Payer: BC Managed Care – PPO | Source: Ambulatory Visit | Attending: Internal Medicine | Admitting: Internal Medicine

## 2012-12-15 VITALS — BP 118/72 | HR 69 | Temp 98.9°F

## 2012-12-15 DIAGNOSIS — J189 Pneumonia, unspecified organism: Secondary | ICD-10-CM

## 2012-12-15 DIAGNOSIS — R059 Cough, unspecified: Secondary | ICD-10-CM

## 2012-12-15 DIAGNOSIS — R05 Cough: Secondary | ICD-10-CM

## 2012-12-15 MED ORDER — GUAIFENESIN-CODEINE 100-10 MG/5ML PO SYRP: 5.0000 mL | ORAL_SOLUTION | Freq: Four times a day (QID) | ORAL | Status: DC | PRN

## 2012-12-15 MED ORDER — LEVOFLOXACIN 500 MG PO TABS: 500.0000 mg | ORAL_TABLET | Freq: Every day | ORAL | Status: AC

## 2012-12-15 NOTE — Patient Instructions (Addendum)
It was good to see you today.  Levaquin antibiotic once a day for 10 days - Your prescription(s) have been submitted to your pharmacy. Please take as directed and contact our office if you believe you are having problem(s) with the medication(s).  codeine cough syrup for nighttime symptoms as needed -prescription given to you to take to your pharmacy  Test(s) ordered today. Your results will be released to MyChart (or called to you) after review, usually within 72hours after test completion. If any changes need to be made, you will be notified at that same time.  Alternate between ibuprofen and tylenol for aches, pain and fever symptoms as discussed  Pneumonia, Adult Pneumonia is an infection of the lungs.  CAUSES Pneumonia may be caused by bacteria or a virus. Usually, these infections are caused by breathing infectious particles into the lungs (respiratory tract). SYMPTOMS   Cough.  Fever.  Chest pain.  Increased rate of breathing.  Wheezing.  Mucus production. DIAGNOSIS  If you have the common symptoms of pneumonia, your caregiver will typically confirm the diagnosis with a chest X-ray. The X-ray will show an abnormality in the lung (pulmonary infiltrate) if you have pneumonia. Other tests of your blood, urine, or sputum may be done to find the specific cause of your pneumonia. Your caregiver may also do tests (blood gases or pulse oximetry) to see how well your lungs are working. TREATMENT  Some forms of pneumonia may be spread to other people when you cough or sneeze. You may be asked to wear a mask before and during your exam. Pneumonia that is caused by bacteria is treated with antibiotic medicine. Pneumonia that is caused by the influenza virus may be treated with an antiviral medicine. Most other viral infections must run their course. These infections will not respond to antibiotics.  PREVENTION A pneumococcal shot (vaccine) is available to prevent a common bacterial cause  of pneumonia. This is usually suggested for:  People over 59 years old.  Patients on chemotherapy.  People with chronic lung problems, such as bronchitis or emphysema.  People with immune system problems. If you are over 65 or have a high risk condition, you may receive the pneumococcal vaccine if you have not received it before. In some countries, a routine influenza vaccine is also recommended. This vaccine can help prevent some cases of pneumonia.You may be offered the influenza vaccine as part of your care. If you smoke, it is time to quit. You may receive instructions on how to stop smoking. Your caregiver can provide medicines and counseling to help you quit. HOME CARE INSTRUCTIONS   Cough suppressants may be used if you are losing too much rest. However, coughing protects you by clearing your lungs. You should avoid using cough suppressants if you can.  Your caregiver may have prescribed medicine if he or she thinks your pneumonia is caused by a bacteria or influenza. Finish your medicine even if you start to feel better.  Your caregiver may also prescribe an expectorant. This loosens the mucus to be coughed up.  Only take over-the-counter or prescription medicines for pain, discomfort, or fever as directed by your caregiver.  Do not smoke. Smoking is a common cause of bronchitis and can contribute to pneumonia. If you are a smoker and continue to smoke, your cough may last several weeks after your pneumonia has cleared.  A cold steam vaporizer or humidifier in your room or home may help loosen mucus.  Coughing is often worse at night.  Sleeping in a semi-upright position in a recliner or using a couple pillows under your head will help with this.  Get rest as you feel it is needed. Your body will usually let you know when you need to rest. SEEK IMMEDIATE MEDICAL CARE IF:   Your illness becomes worse. This is especially true if you are elderly or weakened from any other  disease.  You cannot control your cough with suppressants and are losing sleep.  You begin coughing up blood.  You develop pain which is getting worse or is uncontrolled with medicines.  You have a fever.  Any of the symptoms which initially brought you in for treatment are getting worse rather than better.  You develop shortness of breath or chest pain. MAKE SURE YOU:   Understand these instructions.  Will watch your condition.  Will get help right away if you are not doing well or get worse. Document Released: 12/30/2004 Document Revised: 03/24/2011 Document Reviewed: 03/21/2010 [facility] Patient Information 2014 [place], [place].

## 2012-12-15 NOTE — Progress Notes (Signed)
   Subjective:    Patient ID: Deanna Buck, female    DOB: [birth year], 67 y.o.   MRN: [id number]  Cough This is a new problem. The current episode started more than 1 month ago. The problem has been waxing and waning. The problem occurs every few minutes. The cough is productive of sputum. Associated symptoms include chills, myalgias and shortness of breath (and DOE with min effort). Pertinent negatives include no chest pain, ear pain, fever, headaches, heartburn, hemoptysis, nasal congestion, postnasal drip, rash, sore throat, sweats, weight loss or wheezing. The symptoms are aggravated by lying down. Treatments tried: Zpak with some improvement. The treatment provided mild relief. There is no history of asthma, bronchiectasis or COPD.   Past Medical History  Diagnosis Date  . ECZEMA   . PERIPHERAL EDEMA   . ANXIETY   . GERD   . HYPERTENSION   . OA (osteoarthritis)     Review of Systems  Constitutional: Positive for chills. Negative for fever and weight loss.  HENT: Negative for ear pain, postnasal drip and sore throat.   Respiratory: Positive for cough and shortness of breath (and DOE with min effort). Negative for hemoptysis and wheezing.   Cardiovascular: Negative for chest pain.  Gastrointestinal: Negative for heartburn.  Musculoskeletal: Positive for myalgias.  Skin: Negative for rash.  Neurological: Negative for headaches.       Objective:   Physical Exam BP 118/72  Pulse 69  Temp(Src) 98.9 F (37.2 C) (Oral)  SpO2 94% Wt Readings from Last 3 Encounters:  11/11/12 172 lb 6.4 oz (78.2 kg)  06/09/12 169 lb 6.4 oz (76.839 kg)  03/17/11 164 lb 6.4 oz (74.571 kg)   Constitutional: She appears well-developed and well-nourished. No distress.  HENT: Head: Normocephalic and atraumatic. Sinus nontender. Ears: B TMs ok, no erythema or effusion; Nose: Nose normal. Mouth/Throat: Oropharynx is clear and moist. No oropharyngeal exudate.  Eyes: Conjunctivae and EOM are normal. Pupils  are equal, round, and reactive to light. No scleral icterus.  Neck: Normal range of motion. Neck supple. No JVD present. No thyromegaly present.  Cardiovascular: Normal rate, regular rhythm and normal heart sounds.  No murmur heard. No BLE edema. Pulmonary/Chest: L base crackles. Effort normal at rest. She has no wheezes.  Skin: Skin is warm and dry. No rash noted. No erythema.  Psychiatric: She has a normal mood and affect. Her behavior is normal. Judgment and thought content normal.   Lab Results  Component Value Date   WBC 6.9 06/04/2012   HGB 13.5 06/04/2012   HCT 38.9 06/04/2012   PLT 198.0 06/04/2012   GLUCOSE 89 06/04/2012   CHOL 222* 06/04/2012   TRIG 123.0 06/04/2012   HDL 56.70 06/04/2012   LDLDIRECT 134.7 06/04/2012   LDLCALC 115 09/21/2007   LDLCALC 25 09/21/2007   ALT 13 06/04/2012   AST 18 06/04/2012   NA 139 06/04/2012   K 3.2* 06/04/2012   CL 104 06/04/2012   CREATININE 0.8 06/04/2012   BUN 15 06/04/2012   CO2 30 06/04/2012   TSH 2.04 06/04/2012       Assessment & Plan:   Pneumonia:  Cough > 4 weeks Exam (L base crackles and O2 94%) concerning for atypical PNA  Check CXR tx 10 d Levaquin and Robitussin AC

## 2012-12-15 NOTE — Progress Notes (Signed)
Pre-visit discussion using our clinic review tool. No additional management support is needed unless otherwise documented below in the visit note.  

## 2013-04-25 ENCOUNTER — Other Ambulatory Visit: Payer: Self-pay

## 2013-04-25 DIAGNOSIS — Z1231 Encounter for screening mammogram for malignant neoplasm of breast: Secondary | ICD-10-CM

## 2013-05-13 DIAGNOSIS — R928 Other abnormal and inconclusive findings on diagnostic imaging of breast: Secondary | ICD-10-CM

## 2013-05-13 HISTORY — DX: Other abnormal and inconclusive findings on diagnostic imaging of breast: R92.8

## 2013-05-20 ENCOUNTER — Encounter: Payer: Self-pay | Admitting: Internal Medicine

## 2013-05-27 ENCOUNTER — Ambulatory Visit
Admission: RE | Admit: 2013-05-27 | Discharge: 2013-05-27 | Disposition: A | Discharge status: Home / Self Care | Payer: BC Managed Care – PPO | Source: Ambulatory Visit

## 2013-05-27 DIAGNOSIS — Z1231 Encounter for screening mammogram for malignant neoplasm of breast: Secondary | ICD-10-CM

## 2013-06-01 ENCOUNTER — Other Ambulatory Visit: Payer: Self-pay | Admitting: Internal Medicine

## 2013-06-01 DIAGNOSIS — R928 Other abnormal and inconclusive findings on diagnostic imaging of breast: Secondary | ICD-10-CM

## 2013-06-13 ENCOUNTER — Ambulatory Visit
Admission: RE | Admit: 2013-06-13 | Discharge: 2013-06-13 | Disposition: A | Discharge status: Home / Self Care | Payer: BC Managed Care – PPO | Source: Ambulatory Visit | Attending: Internal Medicine | Admitting: Internal Medicine

## 2013-06-13 ENCOUNTER — Other Ambulatory Visit: Payer: Self-pay | Admitting: Internal Medicine

## 2013-06-13 DIAGNOSIS — R928 Other abnormal and inconclusive findings on diagnostic imaging of breast: Secondary | ICD-10-CM

## 2013-06-13 DIAGNOSIS — N6320 Unspecified lump in the left breast, unspecified quadrant: Secondary | ICD-10-CM

## 2013-06-15 ENCOUNTER — Other Ambulatory Visit: Payer: BC Managed Care – PPO

## 2013-06-22 ENCOUNTER — Other Ambulatory Visit: Payer: Self-pay | Admitting: Internal Medicine

## 2013-06-22 ENCOUNTER — Other Ambulatory Visit: Payer: BC Managed Care – PPO

## 2013-06-22 ENCOUNTER — Ambulatory Visit
Admission: RE | Admit: 2013-06-22 | Discharge: 2013-06-22 | Disposition: A | Discharge status: Home / Self Care | Payer: BC Managed Care – PPO | Source: Ambulatory Visit | Attending: Internal Medicine | Admitting: Internal Medicine

## 2013-06-22 DIAGNOSIS — N6320 Unspecified lump in the left breast, unspecified quadrant: Secondary | ICD-10-CM

## 2013-06-23 ENCOUNTER — Other Ambulatory Visit (INDEPENDENT_AMBULATORY_CARE_PROVIDER_SITE_OTHER): Payer: BC Managed Care – PPO

## 2013-06-23 ENCOUNTER — Encounter: Payer: Self-pay | Admitting: Internal Medicine

## 2013-06-23 ENCOUNTER — Other Ambulatory Visit: Payer: Self-pay | Admitting: Internal Medicine

## 2013-06-23 ENCOUNTER — Ambulatory Visit (INDEPENDENT_AMBULATORY_CARE_PROVIDER_SITE_OTHER): Payer: BC Managed Care – PPO | Admitting: Internal Medicine

## 2013-06-23 VITALS — BP 140/80 | HR 68 | Temp 98.6°F | Ht 62.0 in | Wt 161.0 lb

## 2013-06-23 DIAGNOSIS — Z0000 Encounter for general adult medical examination without abnormal findings: Secondary | ICD-10-CM

## 2013-06-23 DIAGNOSIS — C50919 Malignant neoplasm of unspecified site of unspecified female breast: Secondary | ICD-10-CM

## 2013-06-23 DIAGNOSIS — R928 Other abnormal and inconclusive findings on diagnostic imaging of breast: Secondary | ICD-10-CM

## 2013-06-23 LAB — LIPID PANEL
CHOLESTEROL: 211 mg/dL — AB (ref 0–200)
HDL: 58 mg/dL (ref >39.00)
LDL Cholesterol: 131 mg/dL — ABNORMAL HIGH (ref 0–99)
NONHDL: 153
Total CHOL/HDL Ratio: 4
Triglycerides: 112 mg/dL (ref 0.0–149.0)
VLDL: 22.4 mg/dL (ref 0.0–40.0)

## 2013-06-23 LAB — URINALYSIS, ROUTINE W REFLEX MICROSCOPIC
BILIRUBIN URINE: NEGATIVE
Hgb urine dipstick: NEGATIVE
Ketones, ur: NEGATIVE
LEUKOCYTES UA: NEGATIVE
Nitrite: NEGATIVE
SPECIFIC GRAVITY, URINE: 1.02 (ref 1.000–1.030)
TOTAL PROTEIN, URINE-UPE24: NEGATIVE
Urine Glucose: NEGATIVE
Urobilinogen, UA: 0.2 (ref 0.0–1.0)
pH: 7 (ref 5.0–8.0)

## 2013-06-23 LAB — TSH: TSH: 1.7 u[IU]/mL (ref 0.35–4.50)

## 2013-06-23 LAB — BASIC METABOLIC PANEL
BUN: 15 mg/dL (ref 6–23)
CO2: 29 mEq/L (ref 19–32)
Calcium: 9.8 mg/dL (ref 8.4–10.5)
Chloride: 105 mEq/L (ref 96–112)
Creatinine, Ser: 0.7 mg/dL (ref 0.4–1.2)
GFR: 85.8 mL/min (ref >60.00)
GLUCOSE: 104 mg/dL — AB (ref 70–99)
POTASSIUM: 3.4 meq/L — AB (ref 3.5–5.1)
SODIUM: 140 meq/L (ref 135–145)

## 2013-06-23 LAB — CBC WITH DIFFERENTIAL/PLATELET
BASOS PCT: 0.5 % (ref 0.0–3.0)
Basophils Absolute: 0 10*3/uL (ref 0.0–0.1)
Eosinophils Absolute: 0.2 10*3/uL (ref 0.0–0.7)
Eosinophils Relative: 2 % (ref 0.0–5.0)
HCT: 38.6 % (ref 36.0–46.0)
HEMOGLOBIN: 12.9 g/dL (ref 12.0–15.0)
LYMPHS ABS: 1.3 10*3/uL (ref 0.7–4.0)
Lymphocytes Relative: 13.8 % (ref 12.0–46.0)
MCHC: 33.6 g/dL (ref 30.0–36.0)
MCV: 86.7 fl (ref 78.0–100.0)
MONOS PCT: 6.9 % (ref 3.0–12.0)
Monocytes Absolute: 0.7 10*3/uL (ref 0.1–1.0)
NEUTROS ABS: 7.3 10*3/uL (ref 1.4–7.7)
Neutrophils Relative %: 76.8 % (ref 43.0–77.0)
Platelets: 215 10*3/uL (ref 150.0–400.0)
RBC: 4.45 Mil/uL (ref 3.87–5.11)
RDW: 12.9 % (ref 11.5–15.5)
WBC: 9.5 10*3/uL (ref 4.0–10.5)

## 2013-06-23 LAB — HEPATIC FUNCTION PANEL
ALBUMIN: 4 g/dL (ref 3.5–5.2)
ALK PHOS: 95 U/L (ref 39–117)
ALT: 14 U/L (ref 0–35)
AST: 17 U/L (ref 0–37)
Bilirubin, Direct: 0.2 mg/dL (ref 0.0–0.3)
TOTAL PROTEIN: 7.2 g/dL (ref 6.0–8.3)
Total Bilirubin: 1.2 mg/dL (ref 0.2–1.2)

## 2013-06-23 MED ORDER — HYDROCHLOROTHIAZIDE 25 MG PO TABS: ORAL_TABLET | ORAL | Status: DC

## 2013-06-23 MED ORDER — NADOLOL 40 MG PO TABS: 20.0000 mg | ORAL_TABLET | Freq: Every day | ORAL | Status: DC

## 2013-06-23 MED ORDER — ALPRAZOLAM 0.5 MG PO TABS: 0.5000 mg | ORAL_TABLET | Freq: Three times a day (TID) | ORAL | Status: DC | PRN

## 2013-06-23 MED ORDER — VALACYCLOVIR HCL 1 G PO TABS: 1000.0000 mg | ORAL_TABLET | Freq: Every day | ORAL | Status: DC | PRN

## 2013-06-23 NOTE — Patient Instructions (Addendum)
It was good to see you today.  We have reviewed your prior records including labs and tests today  Health Maintenance reviewed - all recommended immunizations and age-appropriate screenings are up-to-date.  Test(s) ordered today. Your results will be released to [place] (or called to you) after review, usually within 72hours after test completion. If any changes need to be made, you will be notified at that same time.  Medications reviewed and updated, no changes recommended at this time. Xanax as/if needed for nerves - Your prescription(s) have been submitted to your pharmacy. Please take as directed and contact our office if you believe you are having problem(s) with the medication(s).   Please schedule followup in 12 months for annual exam and labs, call sooner if problems.  Health Maintenance, Female A healthy lifestyle and preventative care can promote health and wellness.  Maintain regular health, dental, and eye exams.  Eat a healthy diet. Foods like vegetables, fruits, whole grains, low-fat dairy products, and lean protein foods contain the nutrients you need without too many calories. Decrease your intake of foods high in solid fats, added sugars, and salt. Get information about a proper diet from your caregiver, if necessary.  Regular physical exercise is one of the most important things you can do for your health. Most adults should get at least 150 minutes of moderate-intensity exercise (any activity that increases your heart rate and causes you to sweat) each week. In addition, most adults need muscle-strengthening exercises on 2 or more days a week.   Maintain a healthy weight. The body mass index (BMI) is a screening tool to identify possible weight problems. It provides an estimate of body fat based on height and weight. Your caregiver can help determine your BMI, and can help you achieve or maintain a healthy weight. For adults 20 years and older:  A BMI below 18.5 is  considered underweight.  A BMI of 18.5 to 24.9 is normal.  A BMI of 25 to 29.9 is considered overweight.  A BMI of 30 and above is considered obese.  Maintain normal blood lipids and cholesterol by exercising and minimizing your intake of saturated fat. Eat a balanced diet with plenty of fruits and vegetables. Blood tests for lipids and cholesterol should begin at age 8 and be repeated every 5 years. If your lipid or cholesterol levels are high, you are over 50, or you are a high risk for heart disease, you may need your cholesterol levels checked more frequently.Ongoing high lipid and cholesterol levels should be treated with medicines if diet and exercise are not effective.  If you smoke, find out from your caregiver how to quit. If you do not use tobacco, do not start.  Lung cancer screening is recommended for adults aged 107 80 years who are at high risk for developing lung cancer because of a history of smoking. Yearly low-dose computed tomography (CT) is recommended for people who have at least a 30-pack-year history of smoking and are a current smoker or have quit within the past 15 years. A pack year of smoking is smoking an average of 1 pack of cigarettes a day for 1 year (for example: 1 pack a day for 30 years or 2 packs a day for 15 years). Yearly screening should continue until the smoker has stopped smoking for at least 15 years. Yearly screening should also be stopped for people who develop a health problem that would prevent them from having lung cancer treatment.  If you are pregnant,  do not drink alcohol. If you are breastfeeding, be very cautious about drinking alcohol. If you are not pregnant and choose to drink alcohol, do not exceed 1 drink per day. One drink is considered to be 12 ounces (355 mL) of beer, 5 ounces (148 mL) of wine, or 1.5 ounces (44 mL) of liquor.  Avoid use of street drugs. Do not share needles with anyone. Ask for help if you need support or instructions  about stopping the use of drugs.  High blood pressure causes heart disease and increases the risk of stroke. Blood pressure should be checked at least every 1 to 2 years. Ongoing high blood pressure should be treated with medicines, if weight loss and exercise are not effective.  If you are 73 to 67 years old, ask your caregiver if you should take aspirin to prevent strokes.  Diabetes screening involves taking a blood sample to check your fasting blood sugar level. This should be done once every 3 years, after age 31, if you are within normal weight and without risk factors for diabetes. Testing should be considered at a younger age or be carried out more frequently if you are overweight and have at least 1 risk factor for diabetes.  Breast cancer screening is essential preventative care for women. You should practice "breast self-awareness." This means understanding the normal appearance and feel of your breasts and may include breast self-examination. Any changes detected, no matter how small, should be reported to a caregiver. Women in their 39s and 30s should have a clinical breast exam (CBE) by a caregiver as part of a regular health exam every 1 to 3 years. After age 7, women should have a CBE every year. Starting at age 28, women should consider having a mammogram (breast X-ray) every year. Women who have a family history of breast cancer should talk to their caregiver about genetic screening. Women at a high risk of breast cancer should talk to their caregiver about having an MRI and a mammogram every year.  Breast cancer gene (BRCA)-related cancer risk assessment is recommended for women who have family members with BRCA-related cancers. BRCA-related cancers include breast, ovarian, tubal, and peritoneal cancers. Having family members with these cancers may be associated with an increased risk for harmful changes (mutations) in the breast cancer genes BRCA1 and BRCA2. Results of the assessment  will determine the need for genetic counseling and BRCA1 and BRCA2 testing.  The Pap test is a screening test for cervical cancer. Women should have a Pap test starting at age 14. Between ages 7 and 49, Pap tests should be repeated every 2 years. Beginning at age 87, you should have a Pap test every 3 years as long as the past 3 Pap tests have been normal. If you had a hysterectomy for a problem that was not cancer or a condition that could lead to cancer, then you no longer need Pap tests. If you are between ages 99 and 17, and you have had normal Pap tests going back 10 years, you no longer need Pap tests. If you have had past treatment for cervical cancer or a condition that could lead to cancer, you need Pap tests and screening for cancer for at least 20 years after your treatment. If Pap tests have been discontinued, risk factors (such as a new sexual partner) need to be reassessed to determine if screening should be resumed. Some women have medical problems that increase the chance of getting cervical cancer. In these cases,  your caregiver may recommend more frequent screening and Pap tests.  The human papillomavirus (HPV) test is an additional test that may be used for cervical cancer screening. The HPV test looks for the virus that can cause the cell changes on the cervix. The cells collected during the Pap test can be tested for HPV. The HPV test could be used to screen women aged 36 years and older, and should be used in women of any age who have unclear Pap test results. After the age of 13, women should have HPV testing at the same frequency as a Pap test.  Colorectal cancer can be detected and often prevented. Most routine colorectal cancer screening begins at the age of 96 and continues through age 53. However, your caregiver may recommend screening at an earlier age if you have risk factors for colon cancer. On a yearly basis, your caregiver may provide home test kits to check for hidden blood  in the stool. Use of a small camera at the end of a tube, to directly examine the colon (sigmoidoscopy or colonoscopy), can detect the earliest forms of colorectal cancer. Talk to your caregiver about this at age 14, when routine screening begins. Direct examination of the colon should be repeated every 5 to 10 years through age 38, unless early forms of pre-cancerous polyps or small growths are found.  Hepatitis C blood testing is recommended for all people born from [birth year] through 1964 and any individual with known risks for hepatitis C.  Practice safe sex. Use condoms and avoid high-risk sexual practices to reduce the spread of sexually transmitted infections (STIs). Sexually active women aged 44 and younger should be checked for Chlamydia, which is a common sexually transmitted infection. Older women with new or multiple partners should also be tested for Chlamydia. Testing for other STIs is recommended if you are sexually active and at increased risk.  Osteoporosis is a disease in which the bones lose minerals and strength with aging. This can result in serious bone fractures. The risk of osteoporosis can be identified using a bone density scan. Women ages 5 and over and women at risk for fractures or osteoporosis should discuss screening with their caregivers. Ask your caregiver whether you should be taking a calcium supplement or vitamin D to reduce the rate of osteoporosis.  Menopause can be associated with physical symptoms and risks. Hormone replacement therapy is available to decrease symptoms and risks. You should talk to your caregiver about whether hormone replacement therapy is right for you.  Use sunscreen. Apply sunscreen liberally and repeatedly throughout the day. You should seek shade when your shadow is shorter than you. Protect yourself by wearing long sleeves, pants, a wide-brimmed hat, and sunglasses year round, whenever you are outdoors.  Notify your caregiver of new moles or  changes in moles, especially if there is a change in shape or color. Also notify your caregiver if a mole is larger than the size of a pencil eraser.  Stay current with your immunizations. Document Released: 07/15/2010 Document Revised: 04/26/2012 Document Reviewed: 07/15/2010 [facility] Patient Information 2014 [place].

## 2013-06-23 NOTE — Progress Notes (Signed)
Pre visit review using our clinic review tool, if applicable. No additional management support is needed unless otherwise documented below in the visit note. 

## 2013-06-23 NOTE — Progress Notes (Signed)
Subjective:    Patient ID: Deanna Buck, female    DOB: 1945-12-10, 68 y.o.   MRN: [id number]  HPI  patient is here today for annual physical. Patient feels well in general. Reviewed chronic medical issues and interval medical events  Past Medical History  Diagnosis Date  . ECZEMA   . PERIPHERAL EDEMA   . ANXIETY   . GERD   . HYPERTENSION   . OA (osteoarthritis)    Family History  Problem Relation Age of Onset  . Breast cancer Mother   . Dementia Mother   . Arthritis Other   . Hypertension Other     parent  . Hyperlipidemia Other     parent  . Kidney disease Other     parent   History  Substance Use Topics  . Smoking status: Never Smoker   . Smokeless tobacco: Not on file     Comment: Married, but in stressful relationship with spouse  . Alcohol Use: No    Review of Systems  Constitutional: Negative for fatigue and unexpected weight change.  Respiratory: Negative for cough, shortness of breath and wheezing.   Cardiovascular: Negative for chest pain, palpitations and leg swelling.  Gastrointestinal: Negative for nausea, abdominal pain and diarrhea.  Neurological: Negative for dizziness, weakness, light-headedness and headaches.  Psychiatric/Behavioral: Negative for dysphoric mood. The patient is not nervous/anxious.   All other systems reviewed and are negative.      Objective:   Physical Exam  BP 140/80  Pulse 68  Temp(Src) 98.6 F (37 C) (Oral)  Ht $R'5\' 2"'ze$  (1.575 m)  Wt 161 lb (73.029 kg)  BMI 29.44 kg/m2  SpO2 94% Wt Readings from Last 3 Encounters:  06/23/13 161 lb (73.029 kg)  11/11/12 172 lb 6.4 oz (78.2 kg)  06/09/12 169 lb 6.4 oz (76.839 kg)   Constitutional: She appears well-developed and well-nourished. No distress.  HENT: Head: Normocephalic and atraumatic. Ears: B TMs ok, no erythema or effusion; Nose: Nose normal. Mouth/Throat: Oropharynx is clear and moist. No oropharyngeal exudate.  Eyes: Conjunctivae and EOM are normal. Pupils are  equal, round, and reactive to light. No scleral icterus.  Neck: Normal range of motion. Neck supple. No JVD present. No thyromegaly present.  Breast L - bruising superior to bx sites with firm hematoma medially at 11oclock position -no oozing or drainage, steri strips intact Cardiovascular: Normal rate, regular rhythm and normal heart sounds.  No murmur heard. No BLE edema. Pulmonary/Chest: Effort normal and breath sounds normal. No respiratory distress. She has no wheezes.  Abdominal: Soft. Bowel sounds are normal. She exhibits no distension. There is no tenderness. no masses GU: defer to gyn Musculoskeletal: Normal range of motion, no joint effusions. No gross deformities Neurological: She is alert and oriented to person, place, and time. No cranial nerve deficit. Coordination, balance, strength, speech and gait are normal.  Skin: Skin is warm and dry. No rash noted. No erythema.  Psychiatric: She has and appropriately anxious and occasionally tearful mood and affect. Her behavior is normal. Judgment and thought content normal.    Lab Results  Component Value Date   WBC 6.9 06/04/2012   HGB 13.5 06/04/2012   HCT 38.9 06/04/2012   PLT 198.0 06/04/2012   GLUCOSE 89 06/04/2012   CHOL 222* 06/04/2012   TRIG 123.0 06/04/2012   HDL 56.70 06/04/2012   LDLDIRECT 134.7 06/04/2012   LDLCALC 115 09/21/2007   LDLCALC 25 09/21/2007   ALT 13 06/04/2012   AST 18 06/04/2012  NA 139 06/04/2012   K 3.2* 06/04/2012   CL 104 06/04/2012   CREATININE 0.8 06/04/2012   BUN 15 06/04/2012   CO2 30 06/04/2012   TSH 2.04 06/04/2012    [place] Lt Breast Bx W Loc Dev 1st Lesion Img Bx Spec US Guide  06/22/2013   CLINICAL DATA:  Highly suspicious mass in the left breast at 1 o'clock and suspicious area of shadowing in the left breast at 12 o'clock.  EXAM: ULTRASOUND GUIDED LEFT BREAST CORE NEEDLE BIOPSY  COMPARISON:  Previous exams.  PROCEDURE: I met with the patient and we discussed the procedure of ultrasound-guided biopsy,  including benefits and alternatives. We discussed the high likelihood of a successful procedure. We discussed the risks of the procedure including infection, bleeding, tissue injury, clip migration, and inadequate sampling. Informed written consent was given. The usual time-out protocol was performed immediately prior to the procedure.  1. Using sterile technique and 2% Lidocaine as local anesthetic, under direct ultrasound visualization, a 12 gauge vacuum-assisteddevice was used to perform biopsy of a highly suspicious mass in the left breast at 1 o'clock using a medial approach. At the conclusion of the procedure, a ribbon tissue marker clip was deployed into the biopsy cavity. Follow-up 2-view mammogram was performed and dictated separately.  2. Using sterile technique and 2% Lidocaine as local anesthetic, under direct ultrasound visualization, a 12 gauge vacuum-assisteddevice was used to perform biopsy of the area of shadowing in the left breast at 12 o'clock using a medial approach. At the conclusion of the procedure, a coil shaped tissue marker clip was deployed just above the biopsy cavity.  Note that the patient developed a moderate amount of bleeding during the procedure which may biopsy especially of the area of shadowing at 12 o'clock difficult and the coil shaped clip was placed just superficial to the biopsied area of shadowing.  The patient states that she could not tolerate the mammogram due to the bruising and pain in her left breast and will return in 1 week for a postprocedure mammogram.  IMPRESSION: 1. Ultrasound-guided biopsy of highly suspicious mass in the left breast at 1 o'clock.  2. Ultrasound-guided biopsy of the area of shadowing/ mass in the left breast at 12 o'clock. Biopsy of this area was difficult as the patient was bleeding during the procedure and subsequently developed a moderate hematoma.  3. The patient will return in 1 week for a postprocedure mammogram as she states that the  left breast was too uncomfortable to tolerate a mammogram.   Electronically Signed   By: Everlean Alstrom M.D.   On: 06/22/2013 09:21   [place] Lt Breast Bx W Loc Dev Ea Add Lesion Img Bx Spec US Guide  06/22/2013   CLINICAL DATA:  Highly suspicious mass in the left breast at 1 o'clock and suspicious area of shadowing in the left breast at 12 o'clock.  EXAM: ULTRASOUND GUIDED LEFT BREAST CORE NEEDLE BIOPSY  COMPARISON:  Previous exams.  PROCEDURE: I met with the patient and we discussed the procedure of ultrasound-guided biopsy, including benefits and alternatives. We discussed the high likelihood of a successful procedure. We discussed the risks of the procedure including infection, bleeding, tissue injury, clip migration, and inadequate sampling. Informed written consent was given. The usual time-out protocol was performed immediately prior to the procedure.  1. Using sterile technique and 2% Lidocaine as local anesthetic, under direct ultrasound visualization, a 12 gauge vacuum-assisteddevice was used to perform biopsy of a highly suspicious mass in  the left breast at 1 o'clock using a medial approach. At the conclusion of the procedure, a ribbon tissue marker clip was deployed into the biopsy cavity. Follow-up 2-view mammogram was performed and dictated separately.  2. Using sterile technique and 2% Lidocaine as local anesthetic, under direct ultrasound visualization, a 12 gauge vacuum-assisteddevice was used to perform biopsy of the area of shadowing in the left breast at 12 o'clock using a medial approach. At the conclusion of the procedure, a coil shaped tissue marker clip was deployed just above the biopsy cavity.  Note that the patient developed a moderate amount of bleeding during the procedure which may biopsy especially of the area of shadowing at 12 o'clock difficult and the coil shaped clip was placed just superficial to the biopsied area of shadowing.  The patient states that she could not tolerate the  mammogram due to the bruising and pain in her left breast and will return in 1 week for a postprocedure mammogram.  IMPRESSION: 1. Ultrasound-guided biopsy of highly suspicious mass in the left breast at 1 o'clock.  2. Ultrasound-guided biopsy of the area of shadowing/ mass in the left breast at 12 o'clock. Biopsy of this area was difficult as the patient was bleeding during the procedure and subsequently developed a moderate hematoma.  3. The patient will return in 1 week for a postprocedure mammogram as she states that the left breast was too uncomfortable to tolerate a mammogram.   Electronically Signed   By: Everlean Alstrom M.D.   On: 06/22/2013 09:21       Assessment & Plan:   CPX/v70.0 - Patient has been counseled on age-appropriate routine health concerns for screening and prevention. These are reviewed and up-to-date. Immunizations are up-to-date or declined. Labs ordered and reviewed.  Abnormal mammogram left with biopsies yesterday. Path pending. Support offered. Xanax as needed, patient will followup as needed - reassurance provided regarding post procedure hematoma, continue ice compression and followup as planned, Tylenol when necessary

## 2013-06-24 ENCOUNTER — Telehealth: Payer: Self-pay | Admitting: *Deleted

## 2013-06-24 ENCOUNTER — Other Ambulatory Visit: Payer: Self-pay | Admitting: *Deleted

## 2013-06-24 ENCOUNTER — Telehealth: Payer: Self-pay

## 2013-06-24 DIAGNOSIS — C50412 Malignant neoplasm of upper-outer quadrant of left female breast: Secondary | ICD-10-CM

## 2013-06-24 NOTE — Telephone Encounter (Signed)
Reviewed pathology and appointments with patient. Encouraged her to come to [facility] for educational materials. Provided her with my number for future questions and concerns.

## 2013-06-24 NOTE — Telephone Encounter (Signed)
Confirmed [facility] for 06/29/13 at 12N .  Instructions and contact information given.

## 2013-06-29 ENCOUNTER — Ambulatory Visit
Admission: RE | Admit: 2013-06-29 | Discharge: 2013-06-29 | Disposition: A | Discharge status: Home / Self Care | Payer: BC Managed Care – PPO | Source: Ambulatory Visit | Attending: Internal Medicine | Admitting: Internal Medicine

## 2013-06-29 ENCOUNTER — Encounter: Payer: Self-pay | Admitting: *Deleted

## 2013-06-29 ENCOUNTER — Ambulatory Visit: Payer: BC Managed Care – PPO

## 2013-06-29 ENCOUNTER — Ambulatory Visit
Admission: RE | Admit: 2013-06-29 | Discharge: 2013-06-29 | Disposition: A | Discharge status: Home / Self Care | Payer: BC Managed Care – PPO | Source: Ambulatory Visit | Attending: Radiation Oncology | Admitting: Radiation Oncology

## 2013-06-29 ENCOUNTER — Ambulatory Visit (HOSPITAL_BASED_OUTPATIENT_CLINIC_OR_DEPARTMENT_OTHER): Payer: BC Managed Care – PPO | Admitting: General Surgery

## 2013-06-29 ENCOUNTER — Ambulatory Visit: Payer: BC Managed Care – PPO | Attending: General Surgery | Admitting: Physical Therapy

## 2013-06-29 ENCOUNTER — Other Ambulatory Visit (HOSPITAL_BASED_OUTPATIENT_CLINIC_OR_DEPARTMENT_OTHER): Payer: BC Managed Care – PPO

## 2013-06-29 ENCOUNTER — Encounter: Payer: Self-pay | Admitting: Radiation Oncology

## 2013-06-29 ENCOUNTER — Encounter: Payer: Self-pay | Admitting: Oncology

## 2013-06-29 ENCOUNTER — Ambulatory Visit (HOSPITAL_BASED_OUTPATIENT_CLINIC_OR_DEPARTMENT_OTHER): Payer: BC Managed Care – PPO | Admitting: Oncology

## 2013-06-29 VITALS — BP 138/79 | HR 60 | Temp 98.5°F | Resp 18 | Ht 63.5 in | Wt 157.9 lb

## 2013-06-29 DIAGNOSIS — Z170 Estrogen receptor positive status [ER+]: Secondary | ICD-10-CM

## 2013-06-29 DIAGNOSIS — C50419 Malignant neoplasm of upper-outer quadrant of unspecified female breast: Secondary | ICD-10-CM

## 2013-06-29 DIAGNOSIS — C50919 Malignant neoplasm of unspecified site of unspecified female breast: Secondary | ICD-10-CM

## 2013-06-29 DIAGNOSIS — C50412 Malignant neoplasm of upper-outer quadrant of left female breast: Secondary | ICD-10-CM

## 2013-06-29 DIAGNOSIS — M249 Joint derangement, unspecified: Secondary | ICD-10-CM | POA: Diagnosis not present

## 2013-06-29 DIAGNOSIS — N6320 Unspecified lump in the left breast, unspecified quadrant: Secondary | ICD-10-CM

## 2013-06-29 LAB — CBC WITH DIFFERENTIAL/PLATELET
BASO%: 1.2 % (ref 0.0–2.0)
BASOS ABS: 0.1 10*3/uL (ref 0.0–0.1)
EOS%: 1 % (ref 0.0–7.0)
Eosinophils Absolute: 0.1 10*3/uL (ref 0.0–0.5)
HEMATOCRIT: 38.8 % (ref 34.8–46.6)
HGB: 13.1 g/dL (ref 11.6–15.9)
LYMPH#: 1.8 10*3/uL (ref 0.9–3.3)
LYMPH%: 20.9 % (ref 14.0–49.7)
MCH: 29.2 pg (ref 25.1–34.0)
MCHC: 33.7 g/dL (ref 31.5–36.0)
MCV: 86.6 fL (ref 79.5–101.0)
MONO#: 0.7 10*3/uL (ref 0.1–0.9)
MONO%: 7.9 % (ref 0.0–14.0)
NEUT#: 5.8 10*3/uL (ref 1.5–6.5)
NEUT%: 69 % (ref 38.4–76.8)
Platelets: 252 10*3/uL (ref 145–400)
RBC: 4.48 10*6/uL (ref 3.70–5.45)
RDW: 13 % (ref 11.2–14.5)
WBC: 8.5 10*3/uL (ref 3.9–10.3)

## 2013-06-29 LAB — COMPREHENSIVE METABOLIC PANEL (CC13)
ALK PHOS: 101 U/L (ref 40–150)
ALT: 10 U/L (ref 0–55)
AST: 17 U/L (ref 5–34)
Albumin: 4 g/dL (ref 3.5–5.0)
Anion Gap: 9 mEq/L (ref 3–11)
BUN: 14.3 mg/dL (ref 7.0–26.0)
CO2: 28 mEq/L (ref 22–29)
Calcium: 10.6 mg/dL — ABNORMAL HIGH (ref 8.4–10.4)
Chloride: 105 mEq/L (ref 98–109)
Creatinine: 0.8 mg/dL (ref 0.6–1.1)
Glucose: 113 mg/dl (ref 70–140)
POTASSIUM: 3.4 meq/L — AB (ref 3.5–5.1)
Sodium: 143 mEq/L (ref 136–145)
Total Bilirubin: 1.34 mg/dL — ABNORMAL HIGH (ref 0.20–1.20)
Total Protein: 7.5 g/dL (ref 6.4–8.3)

## 2013-06-29 MED ORDER — GADOBENATE DIMEGLUMINE 529 MG/ML IV SOLN
14.0000 mL | Freq: Once | INTRAVENOUS | Status: AC | PRN
  Administered 2013-06-29: 14 mL via INTRAVENOUS

## 2013-06-29 NOTE — Progress Notes (Signed)
Patient ID: Deanna Buck, female   DOB: 12-May-1945, 68 y.o.   MRN: [id number]  Chief Complaint  Patient presents with  . Other    HPI Deanna Buck is a 68 y.o. female.  Referred by Dr Luberta Robertson HPI  6 yof who has history of bilateral excisional biopsies.  She has family history in mom at age 25.  She had screening mm breast density b and a possible mass/distortoin in the left breast.  Spot views a irregular hypoechoic mass. US shows a hypoechoic irregular mass with indistinct margins in the upper outer left breast approximately 5 cm from the nipple.  This measures 1.3x1.2 cm in size.  There is a smaller mass at 12 oclock 1 cm from the nipple that measures 1 cm in largest diameter.  This is about 3 cm from the other lesion.  US of the left axilla demonstrates a few normal nodes.  MRI of the right breast is normal. MRI of the left breast has a 3.3x1.7x1 cm irregular enhancing mass in the upper outer left breast. There is second mass measuring 1.1x1.1x0.7 cm that is 1.8 cm away from other mass.  The masses on mm are located 2.4 cm apart and have a combined maximum diameter of 4.9cm.  The nodes are all normal. She underwent biopsy of both lesions that are morphologically similar and are grade I-II invasive ductal carcinoma.  This is er/pr positive, her2 negative and Ki is 10%. She is without complaints.  Past Medical History  Diagnosis Date  . ECZEMA   . PERIPHERAL EDEMA   . ANXIETY   . GERD   . HYPERTENSION   . OA (osteoarthritis)   . Abnormal mammogram 05/2013    L breast    Past Surgical History  Procedure Laterality Date  . Breast lumpectomy    . 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 Breast cancer Mother   . Dementia Mother   . Arthritis Other   . Hypertension Other     parent  . Hyperlipidemia Other     parent  . Kidney disease Other     parent    Social  History History  Substance Use Topics  . Smoking status: Never Smoker   . Smokeless tobacco: Not on file     Comment: Married, but in stressful relationship with spouse  . Alcohol Use: No    Allergies  Allergen Reactions  . Morphine And Related Shortness Of Breath  . Sulfonamide Derivatives Hives and Itching  . Penicillins Other (See Comments)    Dr. Sherolyn Buba told her she was allergic to PCN.    Current Outpatient Prescriptions  Medication Sig Dispense Refill  . ALPRAZolam (XANAX) 0.5 MG tablet Take 1 tablet (0.5 mg total) by mouth 3 (three) times daily as needed for anxiety.  30 tablet  1  . hydrochlorothiazide (HYDRODIURIL) 25 MG tablet TAKE 1 TABLET BY MOUTH DAILY  90 tablet  3  . nadolol (CORGARD) 40 MG tablet Take 0.5 tablets (20 mg total) by mouth daily.  45 tablet  3  . valACYclovir (VALTREX) 1000 MG tablet Take 1 tablet (1,000 mg total) by mouth daily as needed.  30 tablet  0  . vitamin B-12 (CYANOCOBALAMIN) 100 MCG tablet Take 50 mcg by mouth daily.       No current facility-administered medications for this visit.  Review of Systems Review of Systems  Constitutional: Negative for fever, chills and unexpected weight change.  HENT: Negative for congestion, hearing loss, sore throat, trouble swallowing and voice change.   Eyes: Negative for visual disturbance.  Respiratory: Negative for cough and wheezing.   Cardiovascular: Negative for chest pain, palpitations and leg swelling.  Gastrointestinal: Negative for nausea, vomiting, abdominal pain, diarrhea, constipation, blood in stool, abdominal distention and anal bleeding.  Genitourinary: Negative for hematuria, vaginal bleeding and difficulty urinating.  Musculoskeletal: Negative for arthralgias.  Skin: Negative for rash and wound.  Neurological: Negative for seizures, syncope and headaches.  Hematological: Negative for adenopathy. Does not bruise/bleed easily.  Psychiatric/Behavioral: Negative for confusion.     There were no vitals taken for this visit.  Physical Exam Physical Exam  Constitutional: She appears well-developed and well-nourished.  Neck: Neck supple.  Cardiovascular: Normal rate, regular rhythm, normal heart sounds and intact distal pulses.   Pulmonary/Chest: Effort normal and breath sounds normal. She has no wheezes. She has no rales. Right breast exhibits no inverted nipple, no mass, no nipple discharge, no skin change and no tenderness. Left breast exhibits no inverted nipple, no mass, no nipple discharge, no skin change and no tenderness.  Abdominal: Soft.  Lymphadenopathy:    She has no cervical adenopathy.    She has no axillary adenopathy.       Right: No supraclavicular adenopathy present.       Left: No supraclavicular adenopathy present.    Data Reviewed EXAM:  BILATERAL BREAST MRI WITH AND WITHOUT CONTRAST  TECHNIQUE:  Multiplanar, multisequence MR images of both breasts were obtained  prior to and following the intravenous administration of 67ml of  MultiHance.  THREE-DIMENSIONAL MR IMAGE RENDERING ON INDEPENDENT WORKSTATION:  Three-dimensional MR images were rendered by post-processing of the  original MR data on an independent workstation. The  three-dimensional MR images were interpreted, and findings are  reported in the following complete MRI report for this study. Three  dimensional images were evaluated at the independent DynaCad  workstation  COMPARISON: Recent mammogram, ultrasound and biopsy examinations.  FINDINGS:  Breast composition: b. Scattered fibroglandular tissue.  Background parenchymal enhancement: Mild  Right breast: No mass or abnormal enhancement.  Left breast: 3.3 x 1.7 x 1.0 cm irregular enhancing mass in the  upper outer left breast in the middle third, containing a biopsy  marker clip artifact. This has a mixture of enhancement kinetics,  including rapid wash-in/wash-out.  There is a second, poorly defined irregular enhancing  mass in the 12  o'clock position of the left breast, more anteriorly. This is  posterior to a larger possible post biopsy marker clip artifact.  This poorly defined irregular mass measures 1.1 x 1.1 x 0.7 cm in  maximum dimensions and is located 1.8 cm anterior, medial and  inferior to the larger mass. Combined, these masses measure 3.9 cm  in maximum diameter.  On review of the patient's recent 3D mammogram images, the masses  are located 2.4 cm apart with a maximum combined diameter of 4.9 cm.  These are seen has areas architectural distortion on the 3D images.  Lymph nodes: No abnormal appearing lymph nodes.  Ancillary findings: None.  IMPRESSION:  1. 3.3 x 1.7 x 1.0 cm biopsy-proven invasive mammary carcinoma in  the 1 o'clock position of the left breast and 1.1 x 1.1 x 0.7 cm  biopsy-proven invasive mammary carcinoma and mammary carcinoma in  situ in 12 o'clock position of the left breast. Together, these  span  an area measuring 4.9 cm in maximum diameter and are compatible with  multifocal invasive mammary carcinoma and mammary carcinoma in situ.  2. No evidence of malignancy on the right and no adenopathy.  RECOMMENDATION:  1. Treatment plan.  2. If a mastectomy is not planned on the left and the patient will  need mammographic needle localization prior to surgery, a left  diagnostic mammogram would be recommended prior to scheduling the  surgery to confirm the location of the biopsy marker clips.  BI-RADS CATEGORY 6: Known biopsy-proven malignancy.    Assessment    Clinical stage II left breast cancer    Plan    Left mastectomy  We discussed the staging and pathophysiology of breast cancer. We discussed all of the different options for treatment for breast cancer including surgery, chemotherapy, radiation therapy, Herceptin, and antiestrogen therapy. She has been seen by medical and radiation oncology today also.  We discussed a sentinel lymph node biopsy as she does  not appear to having lymph node involvement right now. We discussed the performance of that with injection of radioactive tracer. We discussed that she would have an incision underneath her axillary hairline. We discussed that there is a chance of having a positive node with a sentinel lymph node biopsy and we will await the permanent pathology to make any other first further decisions in terms of her treatment. One of these options might be to return to the operating room to perform an axillary lymph node dissection. We discussed up to a 5% risk lifetime of chronic shoulder pain as well as lymphedema associated with a sentinel lymph node biopsy.  We discussed the options for treatment of the breast cancer which included lumpectomy versus a mastectomy. We discussed the performance of the lumpectomy which I don't think she is a candidate.  We discussed the mastectomy and the postoperative care for that as well. We discussed that there is no difference in her survival whether she undergoes lumpectomy with radiation therapy or antiestrogen therapy versus a mastectomy. She is a candidate for a nsm also.  She is going to see plastic surgery then I will see her back after that. We discussed the risks of operation including bleeding, infection, possible reoperation. She understands her further therapy will be based on what her stages at the time of her operation.          WAKEFIELD,MATTHEW 06/29/2013, 2:25 PM

## 2013-06-29 NOTE — Progress Notes (Signed)
Checked in new pt with no financial concerns. °

## 2013-06-29 NOTE — Progress Notes (Signed)
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  HISTORY OF PRESENT ILLNESS::Deanna Buck is a 68 y.o. female who is seen out of the courtesy of Dr. Rolm Bookbinder for consultation as it relates to the patient's recently diagnosed invasive mammary carcinoma of the left breast. The patient is seen as part of the multidisciplinary breast clinic. Earlier this year on routine screening mammography the patient was noted to have a possible mass with distortion within the upper aspect of the left breast. Patient returned for unilateral digital diagnostic mammography on the left side. Patient was noted to have a 2 separate lesions one being in the 12:00 position of the left breast in the  second lesion in the 1:00 position. The larger lesion measuring 1.8 x 1.3 cm.  the second lesion measured 1.3 x 1.2 cm. Patient proceeded to undergo ultrasound-guided biopsy of both of these areas with pathology returning at the 12:00 position invasive mammary carcinoma with associated DCIS. The lesion at the 1:00 position also showed invasive mammary carcinoma.  the 2 specimens showed similar morphology and were felt to be grade 1-2 invasive ductal carcinoma. The tumors were estrogen and progesterone receptor positive with no amplification of HER-2 detected. A subsequent MRI of the breast area confirmed 2 separate lesions the largest being 3.3 cm in the 1:00 position. The second lesion in the 12:00 position measured 1.1 by. Combined area of involvement by the 2 masses was 4.9 cm. The tumors were  2.4 cm apart. With this information the patient is now seen in the multidisciplinary breast clinic.Marland Kitchen  PREVIOUS RADIATION  THERAPY: No  PAST MEDICAL HISTORY:  has a past medical history of ECZEMA; PERIPHERAL EDEMA; ANXIETY; GERD; HYPERTENSION; OA (osteoarthritis); and Abnormal mammogram (05/2013).    PAST SURGICAL HISTORY: Past Surgical History  Procedure Laterality Date  . Breast lumpectomy    . 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  SOCIAL HISTORY:  reports that she has never smoked. She does not have any smokeless tobacco history on file. She reports that she does not drink alcohol or use illicit drugs.  ALLERGIES: Morphine and related; Sulfonamide derivatives; and Penicillins  MEDICATIONS:  Current Outpatient Prescriptions  Medication Sig Dispense Refill  . ALPRAZolam (XANAX) 0.5 MG tablet Take 1 tablet (0.5 mg total) by mouth 3 (three) times daily as needed for anxiety.  30 tablet  1  . hydrochlorothiazide (HYDRODIURIL) 25 MG tablet TAKE 1 TABLET BY MOUTH DAILY  90 tablet  3  . nadolol (CORGARD) 40 MG tablet Take 0.5 tablets (20 mg total) by mouth daily.  45 tablet  3  . valACYclovir (VALTREX) 1000 MG tablet Take 1 tablet (1,000 mg total) by mouth daily as needed.  30 tablet  0  . vitamin B-12 (CYANOCOBALAMIN) 100 MCG tablet Take 50 mcg by mouth daily.       No current facility-administered medications for this encounter.    REVIEW OF SYSTEMS:  A 15 point  review of systems is documented in the electronic medical record. This was obtained by the nursing staff. However, I reviewed this with the patient to discuss relevant findings and make appropriate changes.  Prior to diagnosis the patient denied any pain within the breast area nipple discharge or bleeding. She denies any swelling in her left arm or hand The pain headaches dizziness or blurred  vision. Patient has had some soreness and swelling in the breast since her biopsy associated hematoma formation.   PHYSICAL EXAM:  Vitals - 1 value per visit 4/43/1540  SYSTOLIC 086  DIASTOLIC 79  Pulse 60  Temperature 98.5  Respirations 18  Weight (lb) 157.9  Height 5' 3.5"  BMI 27.53  VISIT REPORT    Gen. this is a very pleasant 68 year old female in no acute distress. She is accompanied by her husband and 2 daughters on examination today.  Examination of the neck and supraclavicular region reveals no evidence of adenopathy. The axillary areas are free of adenopathy. Examination of the lungs reveals them to be clear to auscultation. The heart has a regular rhythm and rate.  Patient preferred to have a breast exam by her surgeon only who will be planning her definitive surgery in light of her discomfort associated with bruising.   ECOG = 0  0 - Asymptomatic (Fully active, able to carry on all predisease activities without restriction)  LABORATORY DATA:  Lab Results  Component Value Date   WBC 8.5 06/29/2013   HGB 13.1 06/29/2013   HCT 38.8 06/29/2013   MCV 86.6 06/29/2013   PLT 252 06/29/2013   NEUTROABS 5.8 06/29/2013   Lab Results  Component Value Date   NA 143 06/29/2013   K 3.4* 06/29/2013   CL 105 06/23/2013   CO2 28 06/29/2013   GLUCOSE 113 06/29/2013   CREATININE 0.8 06/29/2013   CALCIUM 10.6* 06/29/2013      RADIOGRAPHY: Mr Breast Bilateral W Wo Contrast  06/29/2013   CLINICAL DATA:  Recently diagnosed invasive mammary carcinoma in the 1 o'clock position of the left breast and invasive mammary carcinoma and invasive mammary carcinoma in situ in the 12 o'clock position of the left breast. The patient has not had post clip placement mammograms. She had significant hematoma formation following biopsy, precluding clip placement mammograms at that time.  LABS:  None obtained today.  EXAM: BILATERAL BREAST MRI WITH AND WITHOUT CONTRAST  TECHNIQUE: Multiplanar, multisequence MR  images of both breasts were obtained prior to and following the intravenous administration of 44m of MultiHance.  THREE-DIMENSIONAL MR IMAGE RENDERING ON INDEPENDENT WORKSTATION:  Three-dimensional MR images were rendered by post-processing of the original MR data on an independent workstation. The three-dimensional MR images were interpreted, and findings are reported in the following complete MRI report for this study. Three dimensional images were evaluated at the independent DynaCad workstation  COMPARISON:  Recent mammogram, ultrasound and biopsy examinations.  FINDINGS: Breast composition: b.  Scattered fibroglandular tissue.  Background parenchymal enhancement: Mild  Right breast: No mass or abnormal enhancement.  Left breast: 3.3 x 1.7 x 1.0 cm irregular enhancing mass in the upper outer left breast in the middle third, containing a biopsy marker clip artifact. This has a mixture of enhancement kinetics, including rapid wash-in/wash-out.  There is a second, poorly defined irregular enhancing mass in the 12 o'clock position of the left breast, more anteriorly. This is posterior to a larger possible post biopsy marker clip artifact. This poorly defined irregular mass measures 1.1 x 1.1 x 0.7  cm in maximum dimensions and is located 1.8 cm anterior, medial and inferior to the larger mass. Combined, these masses measure 3.9 cm in maximum diameter.  On review of the patient's recent 3D mammogram images, the masses are located 2.4 cm apart with a maximum combined diameter of 4.9 cm. These are seen has areas architectural distortion on the 3D images.  Lymph nodes: No abnormal appearing lymph nodes.  Ancillary findings:  None.  IMPRESSION: 1. 3.3 x 1.7 x 1.0 cm biopsy-proven invasive mammary carcinoma in the 1 o'clock position of the left breast and 1.1 x 1.1 x 0.7 cm biopsy-proven invasive mammary carcinoma and mammary carcinoma in situ in 12 o'clock position of the left breast. Together, these span an area  measuring 4.9 cm in maximum diameter and are compatible with multifocal invasive mammary carcinoma and mammary carcinoma in situ. 2. No evidence of malignancy on the right and no adenopathy.  RECOMMENDATION: 1. Treatment plan. 2. If a mastectomy is not planned on the left and the patient will need mammographic needle localization prior to surgery, a left diagnostic mammogram would be recommended prior to scheduling the surgery to confirm the location of the biopsy marker clips.  BI-RADS CATEGORY  6: Known biopsy-proven malignancy.   Electronically Signed   By: Enrique Sack M.D.   On: 06/29/2013 10:45   Mm Digital Diagnostic Unilat L  06/29/2013   CLINICAL DATA:  Patient is a 68 year old female with recent diagnosis of left breast invasive mammary carcinoma and carcinoma in situ on image guided biopsy performed 06/23/2013 patient subsequently had a post biopsy hematoma and post biopsy mammography was deferred to allow interval healing.  EXAM: DIAGNOSTIC LEFT MAMMOGRAM POST ULTRASOUND BIOPSY  COMPARISON:  Previous exams  FINDINGS: Mammographic images were obtained following ultrasound guided biopsy of two sites in the right breast. A ribbon shaped metal tissue marker is identified in the upper outer left breast at mid to posterior depth and a coil shaped metal tissue marker is seen in the upper midline left breast at anterior depth. A moderately-sized post biopsy hematoma is identified in the upper inner breast.  IMPRESSION: Satisfactory positioning of the metal tissue markers at two sites in the left breast following ultrasound-guided core needle biopsies.  Final Assessment: Post Procedure Mammograms for Marker Placement   Electronically Signed   By: Andres Shad   On: 06/29/2013 11:13   Mm Digital Diagnostic Unilat L  06/13/2013   CLINICAL DATA:  Patient is an asymptomatic 68 year old female who was recalled from screening mammography to further evaluate the left breast. She reports a remote history of left  breast excisional biopsy. Family history breast cancer diagnosed her mother age 64.  EXAM: DIGITAL DIAGNOSTIC  LEFT MAMMOGRAM  ULTRASOUND LEFT BREAST  COMPARISON:  11/24/2006, 12/01/2006, 05/26/2007, 01/26/2008, 02/08/2009, 03/20/2010, 04/16/2011, 05/26/2012, 05/27/2013 mammograms  ACR Breast Density Category c: The breast tissue is heterogeneously dense, which may obscure small masses.  FINDINGS: Left unilateral digital diagnostic mammography demonstrates a heterogeneously dense parenchymal pattern which could obscure detection of small masses. Spot-compression and spot-compression magnification views of the upper-outer left breast reveal a persistent equal density mass with associated architectural distortion and indistinct margins. Exact dimensions are difficult to determine given the indistinct nature, however this measures approximately 1.8 x 1.3 cm in maximal diameter. This mass is associated with a single high-density calcification and faint microcalcifications.  On physical exam, no palpable masses are detected.  Ultrasound is performed, showing a hypoechoic irregular mass with indistinct margins and non parallel  orientation the upper outer left breast at 1 o'clock approximately 5 cm from the nipple. This measures 1.3 x 1.2 cm and and represents an excellent sonographic correlate to the mammographic finding of note. In addition, there is a smaller mass with similar imaging features detected at 12 o'clock 1 cm from the nipple measuring 1 x 0.6 cm in diameter. This mass is located approximately 3 cm from the index lesion towards the left nipple. Patient reports that these are the locations in the breast where she has had prior surgery.  Ultrasound evaluation of the left axilla demonstrates a few morphologically normal lymph nodes.  IMPRESSION: 1. Irregular hypoechoic mass in the upper outer left breast at 1 o'clock 5 cm from the nipple is suspicious for malignancy. 2. A smaller hypoechoic mass with similar  imaging features at 12 o'clock 1 cm from the nipple is also suspicious. 3. Unremarkable left axillary ultrasound.  RECOMMENDATION: Recommend ultrasound-guided core needle biopsies of the irregular masses at 1 o'clock and 12 o'clock in the left breast.  I have discussed the findings and recommendations with the patient. Results were also provided in writing at the conclusion of the visit. If applicable, a reminder letter will be sent to the patient regarding the next appointment.  BI-RADS CATEGORY  4: Suspicious.   Electronically Signed   By: Andres Shad   On: 06/13/2013 14:15   US Breast Ltd Uni Left Inc Axilla  06/13/2013   CLINICAL DATA:  Patient is an asymptomatic 68 year old female who was recalled from screening mammography to further evaluate the left breast. She reports a remote history of left breast excisional biopsy. Family history breast cancer diagnosed her mother age 23.  EXAM: DIGITAL DIAGNOSTIC  LEFT MAMMOGRAM  ULTRASOUND LEFT BREAST  COMPARISON:  11/24/2006, 12/01/2006, 05/26/2007, 01/26/2008, 02/08/2009, 03/20/2010, 04/16/2011, 05/26/2012, 05/27/2013 mammograms  ACR Breast Density Category c: The breast tissue is heterogeneously dense, which may obscure small masses.  FINDINGS: Left unilateral digital diagnostic mammography demonstrates a heterogeneously dense parenchymal pattern which could obscure detection of small masses. Spot-compression and spot-compression magnification views of the upper-outer left breast reveal a persistent equal density mass with associated architectural distortion and indistinct margins. Exact dimensions are difficult to determine given the indistinct nature, however this measures approximately 1.8 x 1.3 cm in maximal diameter. This mass is associated with a single high-density calcification and faint microcalcifications.  On physical exam, no palpable masses are detected.  Ultrasound is performed, showing a hypoechoic irregular mass with indistinct margins and non  parallel orientation the upper outer left breast at 1 o'clock approximately 5 cm from the nipple. This measures 1.3 x 1.2 cm and and represents an excellent sonographic correlate to the mammographic finding of note. In addition, there is a smaller mass with similar imaging features detected at 12 o'clock 1 cm from the nipple measuring 1 x 0.6 cm in diameter. This mass is located approximately 3 cm from the index lesion towards the left nipple. Patient reports that these are the locations in the breast where she has had prior surgery.  Ultrasound evaluation of the left axilla demonstrates a few morphologically normal lymph nodes.  IMPRESSION: 1. Irregular hypoechoic mass in the upper outer left breast at 1 o'clock 5 cm from the nipple is suspicious for malignancy. 2. A smaller hypoechoic mass with similar imaging features at 12 o'clock 1 cm from the nipple is also suspicious. 3. Unremarkable left axillary ultrasound.  RECOMMENDATION: Recommend ultrasound-guided core needle biopsies of the irregular masses at 1 o'clock  and 12 o'clock in the left breast.  I have discussed the findings and recommendations with the patient. Results were also provided in writing at the conclusion of the visit. If applicable, a reminder letter will be sent to the patient regarding the next appointment.  BI-RADS CATEGORY  4: Suspicious.   Electronically Signed   By: Andres Shad   On: 06/13/2013 14:15   [place] Lt Breast Bx W Loc Dev 1st Lesion Img Bx Spec US Guide  06/23/2013   ADDENDUM REPORT: 06/23/2013 14:05  ADDENDUM: The pathology associated with the left breast ultrasound-guided core biopsy at 12 o'clock location demonstrated invasive mammary carcinoma and invasive mammary carcinoma in situ. Also, the pathology associated with the left breast ultrasound-guided core biopsy at the 1 o'clock location demonstrated invasive mammary carcinoma. The pathology findings are concordant with the imaging findings. I have discussed the findings  with the patient by telephone and answered her questions. The patient states her biopsy site is tender in the area of her biopsies and there is moderate hematoma formation per patient. However, she believes that this has not changed since her biopsy procedure. There are no signs of infection per patient. I have offered to see the patient today if she feels that there has been significant change. However, she does not feel that this is needed. The patient is scheduled for the breast cancer multi disciplinary clinic on 06/29/2013. Breast MRI will be scheduled. Post biopsy wound care instructions were reviewed with the patient. The patient was encouraged to call the breast center for additional questions or concerns. Also, mammography post clip placement will be needed early next week as this could not be performed following her biopsy procedure due to her hematoma formation.   Electronically Signed   By: Luberta Robertson M.D.   On: 06/23/2013 14:05   06/23/2013   CLINICAL DATA:  Highly suspicious mass in the left breast at 1 o'clock and suspicious area of shadowing in the left breast at 12 o'clock.  EXAM: ULTRASOUND GUIDED LEFT BREAST CORE NEEDLE BIOPSY  COMPARISON:  Previous exams.  PROCEDURE: I met with the patient and we discussed the procedure of ultrasound-guided biopsy, including benefits and alternatives. We discussed the high likelihood of a successful procedure. We discussed the risks of the procedure including infection, bleeding, tissue injury, clip migration, and inadequate sampling. Informed written consent was given. The usual time-out protocol was performed immediately prior to the procedure.  1. Using sterile technique and 2% Lidocaine as local anesthetic, under direct ultrasound visualization, a 12 gauge vacuum-assisteddevice was used to perform biopsy of a highly suspicious mass in the left breast at 1 o'clock using a medial approach. At the conclusion of the procedure, a ribbon tissue marker clip was  deployed into the biopsy cavity. Follow-up 2-view mammogram was performed and dictated separately.  2. Using sterile technique and 2% Lidocaine as local anesthetic, under direct ultrasound visualization, a 12 gauge vacuum-assisteddevice was used to perform biopsy of the area of shadowing in the left breast at 12 o'clock using a medial approach. At the conclusion of the procedure, a coil shaped tissue marker clip was deployed just above the biopsy cavity.  Note that the patient developed a moderate amount of bleeding during the procedure which may biopsy especially of the area of shadowing at 12 o'clock difficult and the coil shaped clip was placed just superficial to the biopsied area of shadowing.  The patient states that she could not tolerate the mammogram due to the bruising and  pain in her left breast and will return in 1 week for a postprocedure mammogram.  IMPRESSION: 1. Ultrasound-guided biopsy of highly suspicious mass in the left breast at 1 o'clock.  2. Ultrasound-guided biopsy of the area of shadowing/ mass in the left breast at 12 o'clock. Biopsy of this area was difficult as the patient was bleeding during the procedure and subsequently developed a moderate hematoma.  3. The patient will return in 1 week for a postprocedure mammogram as she states that the left breast was too uncomfortable to tolerate a mammogram.  Electronically Signed: By: Everlean Alstrom M.D. On: 06/22/2013 09:21   [place] Lt Breast Bx W Loc Dev Ea Add Lesion Img Bx Spec US Guide  06/23/2013   ADDENDUM REPORT: 06/23/2013 14:05  ADDENDUM: The pathology associated with the left breast ultrasound-guided core biopsy at 12 o'clock location demonstrated invasive mammary carcinoma and invasive mammary carcinoma in situ. Also, the pathology associated with the left breast ultrasound-guided core biopsy at the 1 o'clock location demonstrated invasive mammary carcinoma. The pathology findings are concordant with the imaging findings. I have  discussed the findings with the patient by telephone and answered her questions. The patient states her biopsy site is tender in the area of her biopsies and there is moderate hematoma formation per patient. However, she believes that this has not changed since her biopsy procedure. There are no signs of infection per patient. I have offered to see the patient today if she feels that there has been significant change. However, she does not feel that this is needed. The patient is scheduled for the breast cancer multi disciplinary clinic on 06/29/2013. Breast MRI will be scheduled. Post biopsy wound care instructions were reviewed with the patient. The patient was encouraged to call the breast center for additional questions or concerns. Also, mammography post clip placement will be needed early next week as this could not be performed following her biopsy procedure due to her hematoma formation.   Electronically Signed   By: Luberta Robertson M.D.   On: 06/23/2013 14:05   06/23/2013   CLINICAL DATA:  Highly suspicious mass in the left breast at 1 o'clock and suspicious area of shadowing in the left breast at 12 o'clock.  EXAM: ULTRASOUND GUIDED LEFT BREAST CORE NEEDLE BIOPSY  COMPARISON:  Previous exams.  PROCEDURE: I met with the patient and we discussed the procedure of ultrasound-guided biopsy, including benefits and alternatives. We discussed the high likelihood of a successful procedure. We discussed the risks of the procedure including infection, bleeding, tissue injury, clip migration, and inadequate sampling. Informed written consent was given. The usual time-out protocol was performed immediately prior to the procedure.  1. Using sterile technique and 2% Lidocaine as local anesthetic, under direct ultrasound visualization, a 12 gauge vacuum-assisteddevice was used to perform biopsy of a highly suspicious mass in the left breast at 1 o'clock using a medial approach. At the conclusion of the procedure, a ribbon  tissue marker clip was deployed into the biopsy cavity. Follow-up 2-view mammogram was performed and dictated separately.  2. Using sterile technique and 2% Lidocaine as local anesthetic, under direct ultrasound visualization, a 12 gauge vacuum-assisteddevice was used to perform biopsy of the area of shadowing in the left breast at 12 o'clock using a medial approach. At the conclusion of the procedure, a coil shaped tissue marker clip was deployed just above the biopsy cavity.  Note that the patient developed a moderate amount of bleeding during the procedure which may biopsy  especially of the area of shadowing at 12 o'clock difficult and the coil shaped clip was placed just superficial to the biopsied area of shadowing.  The patient states that she could not tolerate the mammogram due to the bruising and pain in her left breast and will return in 1 week for a postprocedure mammogram.  IMPRESSION: 1. Ultrasound-guided biopsy of highly suspicious mass in the left breast at 1 o'clock.  2. Ultrasound-guided biopsy of the area of shadowing/ mass in the left breast at 12 o'clock. Biopsy of this area was difficult as the patient was bleeding during the procedure and subsequently developed a moderate hematoma.  3. The patient will return in 1 week for a postprocedure mammogram as she states that the left breast was too uncomfortable to tolerate a mammogram.  Electronically Signed: By: Everlean Alstrom M.D. 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 Patient may be a candidate for breast conservation therapy pending surgical evaluation later today. I discussed radiation therapy as part of breast conservation treatment. We also discussed the fact that she may be better served with a mastectomy if a significant defect occurs with partial mastectomy resulting in a suboptimal cosmetic outcome. At This time the patient does not seem to be too keen on breast  conservation therapy but wishes to ensure the least risk recurrence of her breast cancer, whatever surgical approach. If the patient proceeds with mastectomy and sentinel node procedure,  at this point I do not see any strong indications for postmastectomy irradiation. Patient does seem to be interested in immediate reconstruction if she does proceed with mastectomy.  PLAN: Oncotype DX testing. Patient will be scheduled for definitive surgery in the near future by Dr. Donne Hazel.   the patient will be seen in the postoperative period if there are  indications for postmastectomy irradiation or if the patient is deemed a candidate for [id number] [email or web address].     ------------------------------------------------  Blair [email or web address], [email or web address] MD

## 2013-06-29 NOTE — Progress Notes (Signed)
Deanna Buck  [phone] Fax:([phone]) [phone]     ID: Deanna Buck DOB: Nov 13, 1945  MR#: [id number]  VVO#:[id number]  Patient Care Team: Rowe Clack, MD as PCP - General Daria Pastures, MD as Consulting Physician (Obstetrics and Gynecology) Smith Mince, MD as Consulting Physician (Dermatology) Rolm Bookbinder M.D., Gery Pray  CHIEF COMPLAINT: Early stage multifocal breast cancer  CURRENT TREATMENT: Awaiting definitive surgery   BREAST CANCER HISTORY: Deanna Buck had routine yearly screening mammography with tomography 05/27/2013 at the breast Center, showing a possible area of distortion in the left breast. Left diagnostic mammography and ultrasonography 06/13/2013 showed the breast density to be category C. In the upper outer quadrant of the left breast there was a mass measuring approximately 1.8 cm. It was not palpable on exam. Ultrasound showed a hypoechoic irregular mass measuring 1.3 cm correlating with the mammographic finding. In addition, a smaller, 1.0 cm mass was detected approximately 3 cm from the index lesion. The left axilla was negative.  Biopsy of both masses in the left breast 06/22/2013 showed ([id number]) morphologically similar invasive mammary carcinoma, grade 1 or 2, estrogen receptor positive at 93%, progesterone receptor positive at 91%, both with strong staining intensity, with an MIB-1 of 11%, and no HER-2 amplification, the signals ratio being 1.03 and the number per cell 1.55.  On 06/29/2013 the patient underwent bilateral breast MRI at [place]. By MRI the larger left breast mass measured 3.3 cm. The second, smaller mass measured 1.1 cm. Combined the masses measures 3.9 cm maximally. [By mammography, the masses were 2.4 cm apart with a combined diameter of 4.9 cm.]  The patient's subsequent history is as detailed below  INTERVAL HISTORY: Deanna Buck was evaluated in the multidisciplinary breast cancer clinic  06/29/2013 accompanied by her husband Deanna Buck and her daughters Deanna Buck and Deanna Buck  REVIEW OF SYSTEMS: There were no specific symptoms leading to the original mammogram, which was routinely scheduled. The patient denies unusual headaches, visual changes, nausea, vomiting, stiff neck, dizziness, or gait imbalance. There has been no cough, phlegm production, or pleurisy, no chest pain or pressure, and no change in bowel or bladder habits. The patient denies fever, rash, bleeding, unexplained fatigue or unexplained weight loss. She admits to mild sciatica in her lower back and some eczema in her legs, neither of which is more acute or persistent than before. A detailed review of systems was otherwise entirely negative.  PAST MEDICAL HISTORY: Past Medical History  Diagnosis Date  . ECZEMA   . PERIPHERAL EDEMA   . ANXIETY   . GERD   . HYPERTENSION   . OA (osteoarthritis)   . Abnormal mammogram 05/2013    L breast    PAST SURGICAL HISTORY: Past Surgical History  Procedure Laterality Date  . Breast lumpectomy    . 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 Breast cancer Mother   . Dementia Mother   . Arthritis Other   . Hypertension Other     parent  . Hyperlipidemia Other     parent  . Kidney disease Other     parent   the patient's father died at the age of 41 with congestive heart failure. The patient's mother died at the age of 92. She was diagnosed with breast cancer at the age of 33. The only other breast cancer in  the family was on the opposite, father's side, a first cousin diagnosed in her 77s. The patient had one brother who died from a myocardial infarction. She had no sisters.  GYNECOLOGIC HISTORY:  No LMP recorded. Patient is postmenopausal. Menarche age 51, first live birth age 75, the patient is Deanna Buck P2. She underwent menopause approximately 2002. She did  not take hormone replacement. She did take birth control pills remotely for approximately 13 years, with no complications.  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 Her children from a prior marriage are Deanna Buck, who is an Optometrist, and Deanna Buck, who is also an Optometrist. Both live in [place]. The patient's husband Deanna Buck work for [employer]. The patient has 2 grandchildren. She is not a Ambulance person.    ADVANCED DIRECTIVES: In place   HEALTH MAINTENANCE: History  Substance Use Topics  . Smoking status: Never Smoker   . Smokeless tobacco: Not on file     Comment: Married, but in stressful relationship with spouse  . Alcohol Use: No     Colonoscopy:  PAP:  Bone density: 03/31/2011 ad lib. are showing significant osteopenia, with the lowest T score at -2.4  Lipid panel:  Allergies  Allergen Reactions  . Morphine And Related Shortness Of Breath  . Sulfonamide Derivatives Hives and Itching  . Penicillins Other (See Comments)    Dr. Sherolyn Buba told her she was allergic to PCN.    Current Outpatient Prescriptions  Medication Sig Dispense Refill  . ALPRAZolam (XANAX) 0.5 MG tablet Take 1 tablet (0.5 mg total) by mouth 3 (three) times daily as needed for anxiety.  30 tablet  1  . hydrochlorothiazide (HYDRODIURIL) 25 MG tablet TAKE 1 TABLET BY MOUTH DAILY  90 tablet  3  . nadolol (CORGARD) 40 MG tablet Take 0.5 tablets (20 mg total) by mouth daily.  45 tablet  3  . valACYclovir (VALTREX) 1000 MG tablet Take 1 tablet (1,000 mg total) by mouth daily as needed.  30 tablet  0  . vitamin B-12 (CYANOCOBALAMIN) 100 MCG tablet Take 50 mcg by mouth daily.       No current facility-administered medications for this visit.    OBJECTIVE:  Middle-aged white woman in no acute distress Filed Vitals:   06/29/13 1300  BP: 138/79  Pulse: 60  Temp: 98.5 F (36.9 C)  Resp: 18     Body mass index is 27.53 kg/(m^2).    ECOG FS:0 - Asymptomatic  Ocular:  Sclerae unicteric, pupils equal, round and reactive to light Ear-nose-throat: Oropharynx clear, dentition fair Lymphatic: No cervical or supraclavicular adenopathy Lungs no rales or rhonchi, good excursion bilaterally Heart regular rate and rhythm, no murmur appreciated Abd soft, nontender, positive bowel sounds MSK no focal spinal tenderness, no joint edema Neuro: non-focal, well-oriented, appropriate affect Breasts: Deferred   LAB RESULTS:  CMP     Component Value Date/Time   NA 143 06/29/2013 1224   NA 140 06/23/2013 0936   K 3.4* 06/29/2013 1224   K 3.4* 06/23/2013 0936   CL 105 06/23/2013 0936   CO2 28 06/29/2013 1224   CO2 29 06/23/2013 0936   GLUCOSE 113 06/29/2013 1224   GLUCOSE 104* 06/23/2013 0936   BUN 14.3 06/29/2013 1224   BUN 15 06/23/2013 0936   CREATININE 0.8 06/29/2013 1224   CREATININE 0.7 06/23/2013 0936   CALCIUM 10.6* 06/29/2013 1224   CALCIUM 9.8 06/23/2013 0936   PROT 7.5 06/29/2013 1224   PROT 7.2 06/23/2013 0936  ALBUMIN 4.0 06/29/2013 1224   ALBUMIN 4.0 06/23/2013 0936   AST 17 06/29/2013 1224   AST 17 06/23/2013 0936   ALT 10 06/29/2013 1224   ALT 14 06/23/2013 0936   ALKPHOS 101 06/29/2013 1224   ALKPHOS 95 06/23/2013 0936   BILITOT 1.34* 06/29/2013 1224   BILITOT 1.2 06/23/2013 0936   GFRNONAA >60 12/26/2008 1638   GFRAA  Value: >60        The eGFR has been calculated using the MDRD equation. This calculation has not been validated in all clinical situations. eGFR's persistently <60 mL/min signify possible Chronic Kidney Disease. 12/26/2008 1638    I No results found for this basename: SPEP, UPEP,  kappa and lambda light chains    Lab Results  Component Value Date   WBC 8.5 06/29/2013   NEUTROABS 5.8 06/29/2013   HGB 13.1 06/29/2013   HCT 38.8 06/29/2013   MCV 86.6 06/29/2013   PLT 252 06/29/2013      Chemistry      Component Value Date/Time   NA 143 06/29/2013 1224   NA 140 06/23/2013 0936   K 3.4* 06/29/2013 1224   K 3.4* 06/23/2013 0936   CL 105  06/23/2013 0936   CO2 28 06/29/2013 1224   CO2 29 06/23/2013 0936   BUN 14.3 06/29/2013 1224   BUN 15 06/23/2013 0936   CREATININE 0.8 06/29/2013 1224   CREATININE 0.7 06/23/2013 0936      Component Value Date/Time   CALCIUM 10.6* 06/29/2013 1224   CALCIUM 9.8 06/23/2013 0936   ALKPHOS 101 06/29/2013 1224   ALKPHOS 95 06/23/2013 0936   AST 17 06/29/2013 1224   AST 17 06/23/2013 0936   ALT 10 06/29/2013 1224   ALT 14 06/23/2013 0936   BILITOT 1.34* 06/29/2013 1224   BILITOT 1.2 06/23/2013 0936       No results found for this basename: LABCA2    No components found with this basename: LABCA125    No results found for this basename: INR,  in the last 168 hours  Urinalysis    Component Value Date/Time   COLORURINE YELLOW 06/23/2013 [place] 06/23/2013 0936   LABSPEC 1.020 06/23/2013 0936   PHURINE 7.0 06/23/2013 0936   GLUCOSEU NEGATIVE 06/23/2013 0936   GLUCOSEU NEGATIVE 12/26/2008 1615   [place] 06/23/2013 0936   HGBUR negative 09/11/2009 [place] 06/23/2013 0936   BILIRUBINUR neg 02/17/2011 1625   KETONESUR NEGATIVE 06/23/2013 0936   PROTEINUR neg 02/17/2011 1625   PROTEINUR NEGATIVE 12/26/2008 1615   UROBILINOGEN 0.2 06/23/2013 0936   UROBILINOGEN 0.2 02/17/2011 1625   NITRITE NEGATIVE 06/23/2013 0936   NITRITE pos 02/17/2011 1625   LEUKOCYTESUR NEGATIVE 06/23/2013 0936    STUDIES: Mr Breast Bilateral W Wo Contrast  06/29/2013   CLINICAL DATA:  Recently diagnosed invasive mammary carcinoma in the 1 o'clock position of the left breast and invasive mammary carcinoma and invasive mammary carcinoma in situ in the 12 o'clock position of the left breast. The patient has not had post clip placement mammograms. She had significant hematoma formation following biopsy, precluding clip placement mammograms at that time.  LABS:  None obtained today.  EXAM: BILATERAL BREAST MRI WITH AND WITHOUT CONTRAST  TECHNIQUE: Multiplanar, multisequence MR images of both breasts  were obtained prior to and following the intravenous administration of 24m of MultiHance.  THREE-DIMENSIONAL MR IMAGE RENDERING ON INDEPENDENT WORKSTATION:  Three-dimensional MR images were rendered by post-processing of the original MR data  on an independent workstation. The three-dimensional MR images were interpreted, and findings are reported in the following complete MRI report for this study. Three dimensional images were evaluated at the independent DynaCad workstation  COMPARISON:  Recent mammogram, ultrasound and biopsy examinations.  FINDINGS: Breast composition: b.  Scattered fibroglandular tissue.  Background parenchymal enhancement: Mild  Right breast: No mass or abnormal enhancement.  Left breast: 3.3 x 1.7 x 1.0 cm irregular enhancing mass in the upper outer left breast in the middle third, containing a biopsy marker clip artifact. This has a mixture of enhancement kinetics, including rapid wash-in/wash-out.  There is a second, poorly defined irregular enhancing mass in the 12 o'clock position of the left breast, more anteriorly. This is posterior to a larger possible post biopsy marker clip artifact. This poorly defined irregular mass measures 1.1 x 1.1 x 0.7 cm in maximum dimensions and is located 1.8 cm anterior, medial and inferior to the larger mass. Combined, these masses measure 3.9 cm in maximum diameter.  On review of the patient's recent 3D mammogram images, the masses are located 2.4 cm apart with a maximum combined diameter of 4.9 cm. These are seen has areas architectural distortion on the 3D images.  Lymph nodes: No abnormal appearing lymph nodes.  Ancillary findings:  None.  IMPRESSION: 1. 3.3 x 1.7 x 1.0 cm biopsy-proven invasive mammary carcinoma in the 1 o'clock position of the left breast and 1.1 x 1.1 x 0.7 cm biopsy-proven invasive mammary carcinoma and mammary carcinoma in situ in 12 o'clock position of the left breast. Together, these span an area measuring 4.9 cm in maximum  diameter and are compatible with multifocal invasive mammary carcinoma and mammary carcinoma in situ. 2. No evidence of malignancy on the right and no adenopathy.  RECOMMENDATION: 1. Treatment plan. 2. If a mastectomy is not planned on the left and the patient will need mammographic needle localization prior to surgery, a left diagnostic mammogram would be recommended prior to scheduling the surgery to confirm the location of the biopsy marker clips.  BI-RADS CATEGORY  6: Known biopsy-proven malignancy.   Electronically Signed   By: Enrique Sack M.D.   On: 06/29/2013 10:45   Mm Digital Diagnostic Unilat L  06/29/2013   CLINICAL DATA:  Patient is a 68 year old female with recent diagnosis of left breast invasive mammary carcinoma and carcinoma in situ on image guided biopsy performed 06/23/2013 patient subsequently had a post biopsy hematoma and post biopsy mammography was deferred to allow interval healing.  EXAM: DIAGNOSTIC LEFT MAMMOGRAM POST ULTRASOUND BIOPSY  COMPARISON:  Previous exams  FINDINGS: Mammographic images were obtained following ultrasound guided biopsy of two sites in the right breast. A ribbon shaped metal tissue marker is identified in the upper outer left breast at mid to posterior depth and a coil shaped metal tissue marker is seen in the upper midline left breast at anterior depth. A moderately-sized post biopsy hematoma is identified in the upper inner breast.  IMPRESSION: Satisfactory positioning of the metal tissue markers at two sites in the left breast following ultrasound-guided core needle biopsies.  Final Assessment: Post Procedure Mammograms for Marker Placement   Electronically Signed   By: Andres Shad   On: 06/29/2013 11:13   Mm Digital Diagnostic Unilat L  06/13/2013   CLINICAL DATA:  Patient is an asymptomatic 68 year old female who was recalled from screening mammography to further evaluate the left breast. She reports a remote history of left breast excisional biopsy.  Family history breast cancer  diagnosed her mother age 62.  EXAM: DIGITAL DIAGNOSTIC  LEFT MAMMOGRAM  ULTRASOUND LEFT BREAST  COMPARISON:  11/24/2006, 12/01/2006, 05/26/2007, 01/26/2008, 02/08/2009, 03/20/2010, 04/16/2011, 05/26/2012, 05/27/2013 mammograms  ACR Breast Density Category c: The breast tissue is heterogeneously dense, which may obscure small masses.  FINDINGS: Left unilateral digital diagnostic mammography demonstrates a heterogeneously dense parenchymal pattern which could obscure detection of small masses. Spot-compression and spot-compression magnification views of the upper-outer left breast reveal a persistent equal density mass with associated architectural distortion and indistinct margins. Exact dimensions are difficult to determine given the indistinct nature, however this measures approximately 1.8 x 1.3 cm in maximal diameter. This mass is associated with a single high-density calcification and faint microcalcifications.  On physical exam, no palpable masses are detected.  Ultrasound is performed, showing a hypoechoic irregular mass with indistinct margins and non parallel orientation the upper outer left breast at 1 o'clock approximately 5 cm from the nipple. This measures 1.3 x 1.2 cm and and represents an excellent sonographic correlate to the mammographic finding of note. In addition, there is a smaller mass with similar imaging features detected at 12 o'clock 1 cm from the nipple measuring 1 x 0.6 cm in diameter. This mass is located approximately 3 cm from the index lesion towards the left nipple. Patient reports that these are the locations in the breast where she has had prior surgery.  Ultrasound evaluation of the left axilla demonstrates a few morphologically normal lymph nodes.  IMPRESSION: 1. Irregular hypoechoic mass in the upper outer left breast at 1 o'clock 5 cm from the nipple is suspicious for malignancy. 2. A smaller hypoechoic mass with similar imaging features at 12  o'clock 1 cm from the nipple is also suspicious. 3. Unremarkable left axillary ultrasound.  RECOMMENDATION: Recommend ultrasound-guided core needle biopsies of the irregular masses at 1 o'clock and 12 o'clock in the left breast.  I have discussed the findings and recommendations with the patient. Results were also provided in writing at the conclusion of the visit. If applicable, a reminder letter will be sent to the patient regarding the next appointment.  BI-RADS CATEGORY  4: Suspicious.   Electronically Signed   By: Andres Shad   On: 06/13/2013 14:15   US Breast Ltd Uni Left Inc Axilla  06/13/2013   CLINICAL DATA:  Patient is an asymptomatic 68 year old female who was recalled from screening mammography to further evaluate the left breast. She reports a remote history of left breast excisional biopsy. Family history breast cancer diagnosed her mother age 3.  EXAM: DIGITAL DIAGNOSTIC  LEFT MAMMOGRAM  ULTRASOUND LEFT BREAST  COMPARISON:  11/24/2006, 12/01/2006, 05/26/2007, 01/26/2008, 02/08/2009, 03/20/2010, 04/16/2011, 05/26/2012, 05/27/2013 mammograms  ACR Breast Density Category c: The breast tissue is heterogeneously dense, which may obscure small masses.  FINDINGS: Left unilateral digital diagnostic mammography demonstrates a heterogeneously dense parenchymal pattern which could obscure detection of small masses. Spot-compression and spot-compression magnification views of the upper-outer left breast reveal a persistent equal density mass with associated architectural distortion and indistinct margins. Exact dimensions are difficult to determine given the indistinct nature, however this measures approximately 1.8 x 1.3 cm in maximal diameter. This mass is associated with a single high-density calcification and faint microcalcifications.  On physical exam, no palpable masses are detected.  Ultrasound is performed, showing a hypoechoic irregular mass with indistinct margins and non parallel orientation the  upper outer left breast at 1 o'clock approximately 5 cm from the nipple. This measures 1.3 x 1.2 cm and and  represents an excellent sonographic correlate to the mammographic finding of note. In addition, there is a smaller mass with similar imaging features detected at 12 o'clock 1 cm from the nipple measuring 1 x 0.6 cm in diameter. This mass is located approximately 3 cm from the index lesion towards the left nipple. Patient reports that these are the locations in the breast where she has had prior surgery.  Ultrasound evaluation of the left axilla demonstrates a few morphologically normal lymph nodes.  IMPRESSION: 1. Irregular hypoechoic mass in the upper outer left breast at 1 o'clock 5 cm from the nipple is suspicious for malignancy. 2. A smaller hypoechoic mass with similar imaging features at 12 o'clock 1 cm from the nipple is also suspicious. 3. Unremarkable left axillary ultrasound.  RECOMMENDATION: Recommend ultrasound-guided core needle biopsies of the irregular masses at 1 o'clock and 12 o'clock in the left breast.  I have discussed the findings and recommendations with the patient. Results were also provided in writing at the conclusion of the visit. If applicable, a reminder letter will be sent to the patient regarding the next appointment.  BI-RADS CATEGORY  4: Suspicious.   Electronically Signed   By: Andres Shad   On: 06/13/2013 14:15   [place] Lt Breast Bx W Loc Dev 1st Lesion Img Bx Spec US Guide  06/23/2013   ADDENDUM REPORT: 06/23/2013 14:05  ADDENDUM: The pathology associated with the left breast ultrasound-guided core biopsy at 12 o'clock location demonstrated invasive mammary carcinoma and invasive mammary carcinoma in situ. Also, the pathology associated with the left breast ultrasound-guided core biopsy at the 1 o'clock location demonstrated invasive mammary carcinoma. The pathology findings are concordant with the imaging findings. I have discussed the findings with the patient by  telephone and answered her questions. The patient states her biopsy site is tender in the area of her biopsies and there is moderate hematoma formation per patient. However, she believes that this has not changed since her biopsy procedure. There are no signs of infection per patient. I have offered to see the patient today if she feels that there has been significant change. However, she does not feel that this is needed. The patient is scheduled for the breast cancer multi disciplinary clinic on 06/29/2013. Breast MRI will be scheduled. Post biopsy wound care instructions were reviewed with the patient. The patient was encouraged to call the breast center for additional questions or concerns. Also, mammography post clip placement will be needed early next week as this could not be performed following her biopsy procedure due to her hematoma formation.   Electronically Signed   By: Luberta Robertson M.D.   On: 06/23/2013 14:05   06/23/2013   CLINICAL DATA:  Highly suspicious mass in the left breast at 1 o'clock and suspicious area of shadowing in the left breast at 12 o'clock.  EXAM: ULTRASOUND GUIDED LEFT BREAST CORE NEEDLE BIOPSY  COMPARISON:  Previous exams.  PROCEDURE: I met with the patient and we discussed the procedure of ultrasound-guided biopsy, including benefits and alternatives. We discussed the high likelihood of a successful procedure. We discussed the risks of the procedure including infection, bleeding, tissue injury, clip migration, and inadequate sampling. Informed written consent was given. The usual time-out protocol was performed immediately prior to the procedure.  1. Using sterile technique and 2% Lidocaine as local anesthetic, under direct ultrasound visualization, a 12 gauge vacuum-assisteddevice was used to perform biopsy of a highly suspicious mass in the left breast at 1 o'clock using  a medial approach. At the conclusion of the procedure, a ribbon tissue marker clip was deployed into the  biopsy cavity. Follow-up 2-view mammogram was performed and dictated separately.  2. Using sterile technique and 2% Lidocaine as local anesthetic, under direct ultrasound visualization, a 12 gauge vacuum-assisteddevice was used to perform biopsy of the area of shadowing in the left breast at 12 o'clock using a medial approach. At the conclusion of the procedure, a coil shaped tissue marker clip was deployed just above the biopsy cavity.  Note that the patient developed a moderate amount of bleeding during the procedure which may biopsy especially of the area of shadowing at 12 o'clock difficult and the coil shaped clip was placed just superficial to the biopsied area of shadowing.  The patient states that she could not tolerate the mammogram due to the bruising and pain in her left breast and will return in 1 week for a postprocedure mammogram.  IMPRESSION: 1. Ultrasound-guided biopsy of highly suspicious mass in the left breast at 1 o'clock.  2. Ultrasound-guided biopsy of the area of shadowing/ mass in the left breast at 12 o'clock. Biopsy of this area was difficult as the patient was bleeding during the procedure and subsequently developed a moderate hematoma.  3. The patient will return in 1 week for a postprocedure mammogram as she states that the left breast was too uncomfortable to tolerate a mammogram.  Electronically Signed: By: Everlean Alstrom M.D. On: 06/22/2013 09:21   [place] Lt Breast Bx W Loc Dev Ea Add Lesion Img Bx Spec US Guide  06/23/2013   ADDENDUM REPORT: 06/23/2013 14:05  ADDENDUM: The pathology associated with the left breast ultrasound-guided core biopsy at 12 o'clock location demonstrated invasive mammary carcinoma and invasive mammary carcinoma in situ. Also, the pathology associated with the left breast ultrasound-guided core biopsy at the 1 o'clock location demonstrated invasive mammary carcinoma. The pathology findings are concordant with the imaging findings. I have discussed the  findings with the patient by telephone and answered her questions. The patient states her biopsy site is tender in the area of her biopsies and there is moderate hematoma formation per patient. However, she believes that this has not changed since her biopsy procedure. There are no signs of infection per patient. I have offered to see the patient today if she feels that there has been significant change. However, she does not feel that this is needed. The patient is scheduled for the breast cancer multi disciplinary clinic on 06/29/2013. Breast MRI will be scheduled. Post biopsy wound care instructions were reviewed with the patient. The patient was encouraged to call the breast center for additional questions or concerns. Also, mammography post clip placement will be needed early next week as this could not be performed following her biopsy procedure due to her hematoma formation.   Electronically Signed   By: Luberta Robertson M.D.   On: 06/23/2013 14:05   06/23/2013   CLINICAL DATA:  Highly suspicious mass in the left breast at 1 o'clock and suspicious area of shadowing in the left breast at 12 o'clock.  EXAM: ULTRASOUND GUIDED LEFT BREAST CORE NEEDLE BIOPSY  COMPARISON:  Previous exams.  PROCEDURE: I met with the patient and we discussed the procedure of ultrasound-guided biopsy, including benefits and alternatives. We discussed the high likelihood of a successful procedure. We discussed the risks of the procedure including infection, bleeding, tissue injury, clip migration, and inadequate sampling. Informed written consent was given. The usual time-out protocol was performed immediately  prior to the procedure.  1. Using sterile technique and 2% Lidocaine as local anesthetic, under direct ultrasound visualization, a 12 gauge vacuum-assisteddevice was used to perform biopsy of a highly suspicious mass in the left breast at 1 o'clock using a medial approach. At the conclusion of the procedure, a ribbon tissue marker  clip was deployed into the biopsy cavity. Follow-up 2-view mammogram was performed and dictated separately.  2. Using sterile technique and 2% Lidocaine as local anesthetic, under direct ultrasound visualization, a 12 gauge vacuum-assisteddevice was used to perform biopsy of the area of shadowing in the left breast at 12 o'clock using a medial approach. At the conclusion of the procedure, a coil shaped tissue marker clip was deployed just above the biopsy cavity.  Note that the patient developed a moderate amount of bleeding during the procedure which may biopsy especially of the area of shadowing at 12 o'clock difficult and the coil shaped clip was placed just superficial to the biopsied area of shadowing.  The patient states that she could not tolerate the mammogram due to the bruising and pain in her left breast and will return in 1 week for a postprocedure mammogram.  IMPRESSION: 1. Ultrasound-guided biopsy of highly suspicious mass in the left breast at 1 o'clock.  2. Ultrasound-guided biopsy of the area of shadowing/ mass in the left breast at 12 o'clock. Biopsy of this area was difficult as the patient was bleeding during the procedure and subsequently developed a moderate hematoma.  3. The patient will return in 1 week for a postprocedure mammogram as she states that the left breast was too uncomfortable to tolerate a mammogram.  Electronically Signed: By: Everlean Alstrom M.D. On: 06/22/2013 09:21    ASSESSMENT: 68 y.o. [place] woman status post biopsy of 2 separate left breast masses 06/22/2013 for a clinical mT1c N0, stage IA carcinoma, grade 1 or 2, estrogen receptor 93% positive, progesterone receptor 91% positive, with an MIB-1 of 11%, and no HER-2 amplification  PLAN: We spent the better part of today's hour-long appointment discussing the biology of breast cancer in general, and the specifics of the patient's tumor in particular. Carmelle understands her breast cancer is luminal A-like, and  that these tumors generally do very well. In general also they do not respond well to chemotherapy. We discussed that of the 2 options available to her for systemic treatment, she will certainly benefit from antiestrogens but the benefit of chemotherapy may be marginal.  For that reason we will be sending an Oncotype. My expectation is that she will fall in the low or intermediate risk category, which would mean we would do only anti-estrogens assuming of course that the tumor proves to be node negative.  The big question she is struggling  with  relates to local treatment. She is "terrified of radiation". She would consider mastectomy just  to avoid it. The fact that  left mastectomy baby unavoidable given the multifocal nature of her disease further reinforces  her  decision. She is wondering if she should have bilateral mastectomies not because she thinks it would increase her survival chances--she understands it would make no difference in that regard--but because she wants to be "done" within it and because she is concerned regarding symmetry issues with reconstruction. She will be discussing these concerns in more detail with Dr. Donne Hazel today.  Accordingly she is planning to meet with plastics and explore the possibility of reconstruction further before making a final decision regarding surgery. My expectation is she  will be ready for surgery around the middle of July, and accordingly I will plan to see her again early August. If she does not need postmastectomy radiation likely we will be starting anti-estrogens at that time.  Donesha  has a good understanding of the overall plan. She agrees with it. She knows the goal of treatment in her case is cure. She will call with any problems that may develop before her next visit here.  Chauncey Cruel, MD   06/29/2013 4:03 PM

## 2013-06-30 NOTE — Progress Notes (Signed)
[place] Psychosocial Distress Screening Clinical Social Work  Clinical Social Work met with pt, her husband and two daughters at her clinic appointment to check in and review the distress screening protocol.  The patient scored a 9 on the Psychosocial Distress Thermometer which indicates severe distress. Pt reports to have a great support network of family and friends to assist her as she begins her cancer journey. She shared she has had ongoing issues with anxiety for many years and is pretty "high strung" normally. Pt shared she felt a little better after meeting with the team today, but still was quite anxious. CSW discussed this was a very common emotion for her situation, but sometimes pts need additional supports to assist with this anxiety. CSW discussed resources, coping techniques and supports at [facility] to assist. Pt is very open to attending the support groups and may decide to reach out to [employer] as well. She would prefer to do this on her own as she is good friends with the organization's founder. Pt and family felt all their concerns and questions were fully addressed today. Pt and family very appreciative of support today. Pt agrees to meet/contact CSW as needed.   ONCBCN DISTRESS SCREENING 06/29/2013  Screening Type Initial Screening  Elta [place] the number that describes how much distress you have been experiencing in the past week 9  Emotional problem type Nervousness/Anxiety;Adjusting to illness;Feeling hopeless  Spiritual/Religous concerns type Facing my mortality  Referral to clinical social work Yes    Clinical Social Worker follow up needed: no Pt agrees to [employer] CSW as needed.  If yes, follow up plan:   Loren Racer, [place] Worker [facility] [place] for [place] Wednesday, Thursday and Friday Phone: [phone] Fax: [phone]

## 2013-07-01 ENCOUNTER — Telehealth: Payer: Self-pay | Admitting: Oncology

## 2013-07-01 NOTE — Telephone Encounter (Signed)
, °

## 2013-07-04 ENCOUNTER — Encounter: Payer: Self-pay | Admitting: *Deleted

## 2013-07-04 NOTE — Progress Notes (Signed)
Oncotype order received. Requisition sent to pathology. Received by Alyse Low.

## 2013-07-05 ENCOUNTER — Telehealth: Payer: Self-pay | Admitting: *Deleted

## 2013-07-05 ENCOUNTER — Encounter: Payer: Self-pay | Admitting: Oncology

## 2013-07-05 NOTE — Telephone Encounter (Signed)
This RN spoke with pt per her inquiry about " lab results and what they mean".  Per call- Braidyn states she went to her primary MD a week before appointment with Dr Jannifer Rodney -had labs done - was given a copy of them with MD written note stating " everything is normal ".  She has now reviewed labs on my chart- " but there is no explanation from the MD stating he has reviewed this and they are ok "  This RN reviewed with pt labs done at this office compared to labs done the prior week. Noted 3 changes in components including the potassium, the bilirubin and calcium.  Per discussion pt has history of low potassium- " but I am allergic to bananas or I would be eating them like crazy " This RN discussed other dietary intakes of potassium including raisins and potatoes with the skins intact.  Noted slight increase in bilirubin - with pt stating she has been using increased amounts of tylenol " since I had biopsy"  Calcium increased from last week as well.  Per discussion this RN informed pt above labs would be given to MD for review for appropriate follow up.  This RN advised pt to monitor and record her dietary potassium intake and to monitor and record use of tylenol.  Labs will be reviewed with covering provider for any other urgent concerns as well as given to MD for review upon return to the office.

## 2013-07-07 ENCOUNTER — Telehealth: Payer: Self-pay | Admitting: *Deleted

## 2013-07-07 NOTE — Telephone Encounter (Signed)
Spoke to pt regarding [place] from 06/29/13. Pt denies questions or concerns regarding dx or treatment care plan.  Informed pt oncotype results should be back around 07/14/13. Confirmed f/u appt with Dr. Donne Hazel on 6/30 to discuss final surgery decision. Pt denies further needs. We discussed normal feelings of cancer dx. Provided emotional support and encouragement. Request pt to call with further needs or concerns. Received verbal understanding. Contact information given. Pt relayed she went to [facility] supple and was fitted to post mastectomy bra.

## 2013-07-12 ENCOUNTER — Encounter (INDEPENDENT_AMBULATORY_CARE_PROVIDER_SITE_OTHER): Payer: BC Managed Care – PPO | Admitting: General Surgery

## 2013-07-12 ENCOUNTER — Encounter: Payer: Self-pay | Admitting: *Deleted

## 2013-07-12 ENCOUNTER — Encounter (INDEPENDENT_AMBULATORY_CARE_PROVIDER_SITE_OTHER): Payer: Self-pay | Admitting: General Surgery

## 2013-07-12 ENCOUNTER — Ambulatory Visit (INDEPENDENT_AMBULATORY_CARE_PROVIDER_SITE_OTHER): Payer: BC Managed Care – PPO | Admitting: General Surgery

## 2013-07-12 VITALS — BP 142/80 | HR 78 | Resp 18 | Ht 64.0 in | Wt 156.0 lb

## 2013-07-12 DIAGNOSIS — C50412 Malignant neoplasm of upper-outer quadrant of left female breast: Secondary | ICD-10-CM

## 2013-07-12 DIAGNOSIS — C50419 Malignant neoplasm of upper-outer quadrant of unspecified female breast: Secondary | ICD-10-CM

## 2013-07-12 NOTE — Progress Notes (Signed)
Subjective:     Patient ID: Deanna Buck, female   DOB: 1945/04/05, 68 y.o.   MRN: [id number]  HPI 64 yof with multifocal left breast cancer seen in [place] previously. I recommended left mastectomy with snbx.  We discussed nsm via inframammary incision and she returns today after discussing this with Dr Iran Planas. She has no changes and no complaints today  Review of Systems     Objective:   Physical Exam Left breast with hematoma, periareolar incision, excess skin left axilla, no masses    Assessment:     Clinical stage II left breast cancer     Plan:     Left nipple sparing mastectomy, left axillary sentinel node biopsy     We had a long discussion again  today about her diagnosis and the options. I do think that she needs a mastectomy due to her multifocal disease. I do think also she is a candidate for a nipple sparing mastectomy through an inframammary incision. She has been seen by plastic surgery and has decided to undergo expander reconstruction. We did discuss again today why that would be the case. We discussed the risks of surgery including but not limited to bleeding, infection, flap death, wound issues, nipple death requiring removal and later reconstruction, partial nipple loss, partial or complete loss of nipple sensation. We also discussed the nipple biopsy at the time of surgery to rule out cancer. We discussed the sentinel lymph node biopsy as well. I did discuss with her that if on permanent pathology this comes back there is a chance that she would need to return to the operating room. I'm going to work with plastic surgery to get this scheduled in the next couple weeks. She also has an oncotype that should be back later this week I will followup on.

## 2013-07-12 NOTE — Progress Notes (Signed)
Received Oncotype Dx results of 13.  Emailed Dr. Jana Hakim w/ results and placed a copy in his box.  Took a copy to HIM to scan.

## 2013-07-19 ENCOUNTER — Other Ambulatory Visit (INDEPENDENT_AMBULATORY_CARE_PROVIDER_SITE_OTHER): Payer: Self-pay

## 2013-07-19 MED ORDER — UNABLE TO FIND: Status: DC

## 2013-07-29 ENCOUNTER — Encounter (HOSPITAL_BASED_OUTPATIENT_CLINIC_OR_DEPARTMENT_OTHER): Payer: Self-pay | Admitting: *Deleted

## 2013-07-29 NOTE — Progress Notes (Signed)
To come in for ekg-bmet-to bring all meds and overnight bag-does not want any family to see her without dentures- Very anxious Daughters also coming

## 2013-08-01 ENCOUNTER — Other Ambulatory Visit: Payer: Self-pay

## 2013-08-01 ENCOUNTER — Encounter (HOSPITAL_BASED_OUTPATIENT_CLINIC_OR_DEPARTMENT_OTHER)
Admission: RE | Admit: 2013-08-01 | Discharge: 2013-08-01 | Disposition: A | Discharge status: Home / Self Care | Payer: BC Managed Care – PPO | Source: Ambulatory Visit | Attending: General Surgery | Admitting: General Surgery

## 2013-08-01 LAB — BASIC METABOLIC PANEL
Anion gap: 10 (ref 5–15)
BUN: 17 mg/dL (ref 6–23)
CHLORIDE: 103 meq/L (ref 96–112)
CO2: 27 meq/L (ref 19–32)
Calcium: 10.2 mg/dL (ref 8.4–10.5)
Creatinine, Ser: 0.69 mg/dL (ref 0.50–1.10)
GFR calc Af Amer: >90 mL/min (ref >90)
GFR, EST NON AFRICAN AMERICAN: 88 mL/min — AB (ref >90)
Glucose, Bld: 95 mg/dL (ref 70–99)
Potassium: 4.2 mEq/L (ref 3.7–5.3)
SODIUM: 140 meq/L (ref 137–147)

## 2013-08-01 NOTE — H&P (Signed)
  Subjective:    Patient ID: Kathyann Spaugh is a 68 y.o. female.  HPI  Referred by Dr. Donne Hazel for consultation for discussion of breast reconstruction. Pt presented on screening MMG with abnomal left breast. Noted to have a 2 separate lesions one being in the 12:00 position 1.8 x1.3 cm, in the second lesion in the 1:00 position. 1.3 x 1.2 cm. Ultrasound-guided biopsy revealed at both locations invasive mammary carcinoma. ER/PR+, no amplification of HER-2 detected. MRI of the breast area confirmed 2 separate lesions the largest being 3.3 cm in the 1:00 position. The second lesion in the 12:00 position measured 1.1 by. Combined area of involvement by the 2 masses was 4.9 cm. The tumors were 2.4 cm apart. Given these findings, Dr Donne Hazel has counseled Mastectomy. Oncotype pending.  Has had bilateral breast excisions in past for benign disease via periareolar incisions. Current 36 D or DD. Down 8 lb over last 6 months.   Review of Systems  Respiratory:  + shallow breathing  Musculoskeletal:  +sciatica  Psychiatric/Behavioral: The patient is nervous/anxious.  All other systems reviewed and are negative.      Objective:    Physical Exam  Cardiovascular: Normal rate.  Pulmonary/Chest: Effort normal.  Abdominal: Soft.  Genitourinary:  Left breast volume less than right, grade 2 ptosis bilateral, resolving ecchymoses left breast, + induration from biopsy  Sn to nipple R 26 L 25. 5 BW R 15 L 15 Nipple to IMF R 10 L 10 cm  Bilateral periareolar incisions from 10 to 2 o clock  Skin:  Fitzpatrick 2  bilateral axillary soft tissue folds     Assessment:      Multifocal left breast ca.     Plan:      Discussed autologous vs implant, immediate vs delayed reconstruction. Reviewed implant based reconstruction including TE placement, expansion process and placement of permanent implant. Reviewed implant risks inc rupture, infection, need for additional surgery, rotation implant, asymmetry  with left breast. She has discussed NSM with Dr Donne Hazel; we discussed risks of nipple death, need for excision and later reconstruction of this. Reviewed opposite breast reduction/mastopexy for symmetry. She has a friend that went through delayed reconstruction with TE so she is a little familiar with these. Reviewed hospital stay, post op visits, timing of surgeries. Counseled she will have time where she is quite asymmetric with native breast, given degree of ptosis may require additional skin excision on left breast.  Plan immediate expander based reconstruction with possible acellular dermis on left.  She is not interested in the extent of surgery or additional site to heal from autologous reconstruction at this time.     [name], MD [facility] Plastic & Reconstructive Surgery [phone]

## 2013-08-04 ENCOUNTER — Ambulatory Visit (HOSPITAL_BASED_OUTPATIENT_CLINIC_OR_DEPARTMENT_OTHER): Payer: BC Managed Care – PPO | Admitting: Anesthesiology

## 2013-08-04 ENCOUNTER — Encounter (HOSPITAL_COMMUNITY)
Admission: RE | Admit: 2013-08-04 | Discharge: 2013-08-04 | Disposition: A | Discharge status: Home / Self Care | Payer: BC Managed Care – PPO | Source: Ambulatory Visit | Attending: General Surgery | Admitting: General Surgery

## 2013-08-04 ENCOUNTER — Encounter (HOSPITAL_BASED_OUTPATIENT_CLINIC_OR_DEPARTMENT_OTHER): Payer: BC Managed Care – PPO | Admitting: Anesthesiology

## 2013-08-04 ENCOUNTER — Ambulatory Visit (HOSPITAL_BASED_OUTPATIENT_CLINIC_OR_DEPARTMENT_OTHER)
Admission: RE | Admit: 2013-08-04 | Discharge: 2013-08-05 | Disposition: A | Discharge status: Home / Self Care | Payer: BC Managed Care – PPO | Source: Ambulatory Visit | Attending: General Surgery | Admitting: General Surgery

## 2013-08-04 ENCOUNTER — Encounter (HOSPITAL_BASED_OUTPATIENT_CLINIC_OR_DEPARTMENT_OTHER): Payer: Self-pay

## 2013-08-04 ENCOUNTER — Encounter (HOSPITAL_BASED_OUTPATIENT_CLINIC_OR_DEPARTMENT_OTHER)
Admission: RE | Disposition: A | Discharge status: Home / Self Care | Payer: Self-pay | Source: Ambulatory Visit | Attending: General Surgery

## 2013-08-04 DIAGNOSIS — C50912 Malignant neoplasm of unspecified site of left female breast: Secondary | ICD-10-CM

## 2013-08-04 DIAGNOSIS — C773 Secondary and unspecified malignant neoplasm of axilla and upper limb lymph nodes: Secondary | ICD-10-CM | POA: Insufficient documentation

## 2013-08-04 DIAGNOSIS — C50419 Malignant neoplasm of upper-outer quadrant of unspecified female breast: Secondary | ICD-10-CM | POA: Insufficient documentation

## 2013-08-04 DIAGNOSIS — C50412 Malignant neoplasm of upper-outer quadrant of left female breast: Secondary | ICD-10-CM

## 2013-08-04 DIAGNOSIS — Z01812 Encounter for preprocedural laboratory examination: Secondary | ICD-10-CM | POA: Insufficient documentation

## 2013-08-04 DIAGNOSIS — K219 Gastro-esophageal reflux disease without esophagitis: Secondary | ICD-10-CM | POA: Insufficient documentation

## 2013-08-04 DIAGNOSIS — C50919 Malignant neoplasm of unspecified site of unspecified female breast: Secondary | ICD-10-CM | POA: Insufficient documentation

## 2013-08-04 DIAGNOSIS — D0590 Unspecified type of carcinoma in situ of unspecified breast: Secondary | ICD-10-CM

## 2013-08-04 DIAGNOSIS — Z01810 Encounter for preprocedural cardiovascular examination: Secondary | ICD-10-CM | POA: Insufficient documentation

## 2013-08-04 DIAGNOSIS — I10 Essential (primary) hypertension: Secondary | ICD-10-CM | POA: Insufficient documentation

## 2013-08-04 HISTORY — DX: Presence of dental prosthetic device (complete) (partial): Z97.2

## 2013-08-04 HISTORY — PX: BREAST RECONSTRUCTION WITH PLACEMENT OF TISSUE EXPANDER AND FLEX HD (ACELLULAR HYDRATED DERMIS): SHX6295

## 2013-08-04 HISTORY — DX: Complete loss of teeth, unspecified cause, unspecified class: K08.109

## 2013-08-04 HISTORY — DX: Presence of spectacles and contact lenses: Z97.3

## 2013-08-04 SURGERY — NIPPLE SPARING MASTECTOMY WITH SENTINAL LYMPH NODE BIOPSY AND  RECONSTRUCTION WITH PLACEMENT OF TISSUE EXPANDER
Anesthesia: General | Laterality: Left

## 2013-08-04 MED ORDER — HYDROMORPHONE HCL PF 1 MG/ML IJ SOLN
INTRAMUSCULAR | Status: AC
  Filled 2013-08-04: qty 1

## 2013-08-04 MED ORDER — MIDAZOLAM HCL 2 MG/2ML IJ SOLN
1.0000 mg | INTRAMUSCULAR | Status: DC | PRN
  Administered 2013-08-04 (×2): 1 mg via INTRAVENOUS

## 2013-08-04 MED ORDER — VANCOMYCIN HCL IN DEXTROSE 1-5 GM/200ML-% IV SOLN
INTRAVENOUS | Status: AC
  Filled 2013-08-04: qty 200

## 2013-08-04 MED ORDER — LACTATED RINGERS IV SOLN
INTRAVENOUS | Status: DC
Start: 2013-08-04 — End: 2013-08-04
  Administered 2013-08-04 (×2): via INTRAVENOUS
  Administered 2013-08-04: 10 mL/h via INTRAVENOUS

## 2013-08-04 MED ORDER — OXYCODONE HCL 5 MG PO TABS: 5.0000 mg | ORAL_TABLET | Freq: Once | ORAL | Status: DC | PRN

## 2013-08-04 MED ORDER — PROMETHAZINE HCL 25 MG/ML IJ SOLN
INTRAMUSCULAR | Status: AC
  Filled 2013-08-04: qty 1

## 2013-08-04 MED ORDER — EPHEDRINE SULFATE 50 MG/ML IJ SOLN
INTRAMUSCULAR | Status: DC | PRN
  Administered 2013-08-04: 10 mg via INTRAVENOUS

## 2013-08-04 MED ORDER — PROPOFOL 10 MG/ML IV BOLUS
INTRAVENOUS | Status: AC
  Filled 2013-08-04: qty 100

## 2013-08-04 MED ORDER — HYDROMORPHONE HCL PF 1 MG/ML IJ SOLN
0.5000 mg | INTRAMUSCULAR | Status: DC | PRN
  Administered 2013-08-04: 1 mg via INTRAVENOUS
  Filled 2013-08-04: qty 1

## 2013-08-04 MED ORDER — PROPOFOL 10 MG/ML IV BOLUS
INTRAVENOUS | Status: DC | PRN
  Administered 2013-08-04: 200 mg via INTRAVENOUS

## 2013-08-04 MED ORDER — CLINDAMYCIN PHOSPHATE 300 MG/50ML IV SOLN
300.0000 mg | Freq: Three times a day (TID) | INTRAVENOUS | Status: AC
  Administered 2013-08-04 – 2013-08-05 (×3): 300 mg via INTRAVENOUS

## 2013-08-04 MED ORDER — FENTANYL CITRATE 0.05 MG/ML IJ SOLN
INTRAMUSCULAR | Status: AC
  Filled 2013-08-04: qty 2

## 2013-08-04 MED ORDER — VITAMIN B-12 100 MCG PO TABS: 50.0000 ug | ORAL_TABLET | Freq: Every day | ORAL | Status: DC

## 2013-08-04 MED ORDER — HYDROMORPHONE HCL PF 1 MG/ML IJ SOLN
0.2500 mg | INTRAMUSCULAR | Status: DC | PRN
  Administered 2013-08-04 (×2): 0.25 mg via INTRAVENOUS
  Administered 2013-08-04: 0.5 mg via INTRAVENOUS

## 2013-08-04 MED ORDER — BUPIVACAINE HCL (PF) 0.25 % IJ SOLN
INTRAMUSCULAR | Status: AC
  Filled 2013-08-04: qty 30

## 2013-08-04 MED ORDER — FENTANYL CITRATE 0.05 MG/ML IJ SOLN
INTRAMUSCULAR | Status: DC | PRN
  Administered 2013-08-04 (×4): 50 ug via INTRAVENOUS

## 2013-08-04 MED ORDER — NADOLOL 20 MG PO TABS: 20.0000 mg | ORAL_TABLET | Freq: Every day | ORAL | Status: DC

## 2013-08-04 MED ORDER — SODIUM CHLORIDE 0.9 % IJ SOLN
INTRAMUSCULAR | Status: AC
Start: 2013-08-04 — End: 2013-08-04
  Filled 2013-08-04: qty 10

## 2013-08-04 MED ORDER — GLYCOPYRROLATE 0.2 MG/ML IJ SOLN
INTRAMUSCULAR | Status: DC | PRN
  Administered 2013-08-04: 0.2 mg via INTRAVENOUS

## 2013-08-04 MED ORDER — METHYLENE BLUE 1 % INJ SOLN
INTRAMUSCULAR | Status: AC
  Filled 2013-08-04: qty 10

## 2013-08-04 MED ORDER — LIDOCAINE HCL (CARDIAC) 20 MG/ML IV SOLN
INTRAVENOUS | Status: DC | PRN
  Administered 2013-08-04: 50 mg via INTRAVENOUS

## 2013-08-04 MED ORDER — DEXAMETHASONE SODIUM PHOSPHATE 4 MG/ML IJ SOLN
INTRAMUSCULAR | Status: DC | PRN
  Administered 2013-08-04: 10 mg via INTRAVENOUS

## 2013-08-04 MED ORDER — ONDANSETRON HCL 4 MG PO TABS: 4.0000 mg | ORAL_TABLET | Freq: Four times a day (QID) | ORAL | Status: DC | PRN

## 2013-08-04 MED ORDER — MIDAZOLAM HCL 2 MG/2ML IJ SOLN
INTRAMUSCULAR | Status: AC
  Filled 2013-08-04: qty 2

## 2013-08-04 MED ORDER — CLINDAMYCIN PHOSPHATE 300 MG/50ML IV SOLN
INTRAVENOUS | Status: AC
  Filled 2013-08-04: qty 50

## 2013-08-04 MED ORDER — SODIUM CHLORIDE 0.9 % IJ SOLN
INTRAMUSCULAR | Status: DC | PRN
  Administered 2013-08-04: 08:00:00 via INTRADERMAL

## 2013-08-04 MED ORDER — SODIUM CHLORIDE 0.9 % IV SOLN
INTRAVENOUS | Status: DC | PRN
  Administered 2013-08-04: 10:00:00

## 2013-08-04 MED ORDER — SODIUM CHLORIDE 0.9 % IJ SOLN
INTRAMUSCULAR | Status: AC
  Filled 2013-08-04: qty 10

## 2013-08-04 MED ORDER — TRAMADOL HCL 50 MG PO TABS: 50.0000 mg | ORAL_TABLET | Freq: Two times a day (BID) | ORAL | Status: DC | PRN

## 2013-08-04 MED ORDER — FENTANYL CITRATE 0.05 MG/ML IJ SOLN
50.0000 ug | INTRAMUSCULAR | Status: DC | PRN
  Administered 2013-08-04 (×2): 50 ug via INTRAVENOUS

## 2013-08-04 MED ORDER — OXYCODONE-ACETAMINOPHEN 5-325 MG PO TABS
1.0000 | ORAL_TABLET | ORAL | Status: DC | PRN
  Administered 2013-08-04 (×2): 1 via ORAL
  Administered 2013-08-05 (×2): 2 via ORAL
  Filled 2013-08-04: qty 1
  Filled 2013-08-04: qty 2
  Filled 2013-08-04: qty 1
  Filled 2013-08-04: qty 2

## 2013-08-04 MED ORDER — KCL IN DEXTROSE-NACL 20-5-0.45 MEQ/L-%-% IV SOLN
INTRAVENOUS | Status: DC
  Administered 2013-08-04: 13:00:00 via INTRAVENOUS
  Administered 2013-08-05: 1 mL via INTRAVENOUS
  Filled 2013-08-04: qty 1000

## 2013-08-04 MED ORDER — PROMETHAZINE HCL 25 MG/ML IJ SOLN
6.2500 mg | INTRAMUSCULAR | Status: DC | PRN
  Administered 2013-08-04: 6.25 mg via INTRAVENOUS

## 2013-08-04 MED ORDER — FENTANYL CITRATE 0.05 MG/ML IJ SOLN
INTRAMUSCULAR | Status: AC
  Filled 2013-08-04: qty 8

## 2013-08-04 MED ORDER — ONDANSETRON HCL 4 MG/2ML IJ SOLN: 4.0000 mg | Freq: Four times a day (QID) | INTRAMUSCULAR | Status: DC | PRN

## 2013-08-04 MED ORDER — BUPIVACAINE HCL (PF) 0.5 % IJ SOLN
INTRAMUSCULAR | Status: DC | PRN
  Administered 2013-08-04: 30 mL

## 2013-08-04 MED ORDER — HYDROCHLOROTHIAZIDE 25 MG PO TABS: 25.0000 mg | ORAL_TABLET | Freq: Every day | ORAL | Status: DC

## 2013-08-04 MED ORDER — CLINDAMYCIN PHOSPHATE 300 MG/50ML IV SOLN
INTRAVENOUS | Status: AC
Start: 2013-08-04 — End: 2013-08-04
  Filled 2013-08-04: qty 50

## 2013-08-04 MED ORDER — PANTOPRAZOLE SODIUM 40 MG PO TBEC: 40.0000 mg | DELAYED_RELEASE_TABLET | Freq: Every day | ORAL | Status: DC

## 2013-08-04 MED ORDER — SODIUM CHLORIDE 0.9 % IV SOLN
1000.0000 mg | INTRAVENOUS | Status: DC | PRN
  Administered 2013-08-04: 1000 mg via INTRAVENOUS

## 2013-08-04 MED ORDER — TECHNETIUM TC 99M SULFUR COLLOID FILTERED
1.0000 | Freq: Once | INTRAVENOUS | Status: AC | PRN
  Administered 2013-08-04: 1 via INTRADERMAL

## 2013-08-04 MED ORDER — VANCOMYCIN HCL 1000 MG IV SOLR: 1000.0000 mg | INTRAVENOUS | Status: DC | PRN

## 2013-08-04 MED ORDER — SUCCINYLCHOLINE CHLORIDE 20 MG/ML IJ SOLN
INTRAMUSCULAR | Status: DC | PRN
  Administered 2013-08-04: 50 mg via INTRAVENOUS

## 2013-08-04 MED ORDER — OXYCODONE HCL 5 MG/5ML PO SOLN: 5.0000 mg | Freq: Once | ORAL | Status: DC | PRN

## 2013-08-04 MED ORDER — ALPRAZOLAM 0.5 MG PO TABS
0.5000 mg | ORAL_TABLET | Freq: Three times a day (TID) | ORAL | Status: DC | PRN
  Administered 2013-08-04: 0.5 mg via ORAL
  Filled 2013-08-04: qty 2

## 2013-08-04 MED ORDER — VANCOMYCIN HCL IN DEXTROSE 1-5 GM/200ML-% IV SOLN
1000.0000 mg | INTRAVENOUS | Status: AC
  Administered 2013-08-04: 1000 mg via INTRAVENOUS

## 2013-08-04 SURGICAL SUPPLY — 93 items
ADH SKN CLS APL DERMABOND .7 (GAUZE/BANDAGES/DRESSINGS) ×1
APPLIER CLIP 11 MED OPEN (CLIP)
APPLIER CLIP 9.375 MED OPEN (MISCELLANEOUS)
APR CLP MED 11 20 MLT OPN (CLIP)
APR CLP MED 9.3 20 MLT OPN (MISCELLANEOUS)
BAG DECANTER FOR FLEXI CONT (MISCELLANEOUS) ×3 IMPLANT
BINDER BREAST LRG (GAUZE/BANDAGES/DRESSINGS) IMPLANT
BINDER BREAST MEDIUM (GAUZE/BANDAGES/DRESSINGS) IMPLANT
BINDER BREAST XLRG (GAUZE/BANDAGES/DRESSINGS) ×1 IMPLANT
BINDER BREAST XXLRG (GAUZE/BANDAGES/DRESSINGS) IMPLANT
BIOPATCH RED 1 DISK 7.0 (GAUZE/BANDAGES/DRESSINGS) ×1 IMPLANT
BLADE HEX COATED 2.75 (ELECTRODE) ×2 IMPLANT
BLADE SURG 10 STRL SS (BLADE) ×1 IMPLANT
BLADE SURG 15 STRL LF DISP TIS (BLADE) ×2 IMPLANT
BLADE SURG 15 STRL SS (BLADE) ×6
BLADE SURG ROTATE 9660 (MISCELLANEOUS) IMPLANT
BNDG GAUZE ELAST 4 BULKY (GAUZE/BANDAGES/DRESSINGS) ×4 IMPLANT
CANISTER SUCT 1200ML W/VALVE (MISCELLANEOUS) ×4 IMPLANT
CHLORAPREP W/TINT 26ML (MISCELLANEOUS) ×3 IMPLANT
CLIP APPLIE 11 MED OPEN (CLIP) ×1 IMPLANT
CLIP APPLIE 9.375 MED OPEN (MISCELLANEOUS) IMPLANT
COVER MAYO STAND STRL (DRAPES) ×4 IMPLANT
COVER PROBE W GEL 5X96 (DRAPES) ×2 IMPLANT
COVER TABLE BACK 60X90 (DRAPES) ×3 IMPLANT
DECANTER SPIKE VIAL GLASS SM (MISCELLANEOUS) IMPLANT
DERMABOND ADVANCED (GAUZE/BANDAGES/DRESSINGS) ×1
DERMABOND ADVANCED .7 DNX12 (GAUZE/BANDAGES/DRESSINGS) ×1 IMPLANT
DRAIN CHANNEL 15F RND FF W/TCR (WOUND CARE) ×1 IMPLANT
DRAIN CHANNEL 19F RND (DRAIN) ×3 IMPLANT
DRAPE LAPAROSCOPIC ABDOMINAL (DRAPES) ×3 IMPLANT
DRSG PAD ABDOMINAL 8X10 ST (GAUZE/BANDAGES/DRESSINGS) ×6 IMPLANT
DRSG TEGADERM 2-3/8X2-3/4 SM (GAUZE/BANDAGES/DRESSINGS) IMPLANT
DRSG TEGADERM 4X10 (GAUZE/BANDAGES/DRESSINGS) ×2 IMPLANT
DRSG TEGADERM 4X4.75 (GAUZE/BANDAGES/DRESSINGS) ×1 IMPLANT
ELECT BLADE 4.0 EZ CLEAN MEGAD (MISCELLANEOUS) ×4
ELECT BLADE 6.5 .24CM SHAFT (ELECTRODE) ×1 IMPLANT
ELECT COATED BLADE 2.86 ST (ELECTRODE) ×1 IMPLANT
ELECT REM PT RETURN 9FT ADLT (ELECTROSURGICAL) ×2
ELECTRODE BLDE 4.0 EZ CLN MEGD (MISCELLANEOUS) ×2 IMPLANT
ELECTRODE REM PT RTRN 9FT ADLT (ELECTROSURGICAL) ×2 IMPLANT
EVACUATOR SILICONE 100CC (DRAIN) ×4 IMPLANT
GAUZE SPONGE 4X4 12PLY STRL (GAUZE/BANDAGES/DRESSINGS) ×2 IMPLANT
GAUZE XEROFORM 5X9 LF (GAUZE/BANDAGES/DRESSINGS) ×1 IMPLANT
GLOVE BIO SURGEON STRL SZ 6 (GLOVE) ×3 IMPLANT
GLOVE BIO SURGEON STRL SZ 6.5 (GLOVE) ×2 IMPLANT
GLOVE BIO SURGEON STRL SZ7 (GLOVE) ×2 IMPLANT
GLOVE BIOGEL PI IND STRL 7.5 (GLOVE) ×1 IMPLANT
GLOVE BIOGEL PI INDICATOR 7.5 (GLOVE) ×1
GLOVE EXAM NITRILE MD LF STRL (GLOVE) ×1 IMPLANT
GLOVE SURG SS PI 7.0 STRL IVOR (GLOVE) ×1 IMPLANT
GOWN STRL REUS W/ TWL LRG LVL3 (GOWN DISPOSABLE) ×7 IMPLANT
GOWN STRL REUS W/TWL LRG LVL3 (GOWN DISPOSABLE) ×10
GRAFT FLEX HD 4X16 THICK (Tissue Mesh) ×1 IMPLANT
IMPL BREAST TIS EXP M 350CC (Breast) IMPLANT
IMPLANT BREAST TIS EXP M 350CC (Breast) ×2 IMPLANT
IV NS 500ML (IV SOLUTION) ×2
IV NS 500ML BAXH (IV SOLUTION) ×5 IMPLANT
KIT FILL SYSTEM UNIVERSAL (SET/KITS/TRAYS/PACK) ×3 IMPLANT
NDL HYPO 25X1 1.5 SAFETY (NEEDLE) IMPLANT
NDL SAFETY ECLIPSE 18X1.5 (NEEDLE) IMPLANT
NEEDLE HYPO 18GX1.5 SHARP (NEEDLE) ×2
NEEDLE HYPO 25X1 1.5 SAFETY (NEEDLE) ×2 IMPLANT
NS IRRIG 1000ML POUR BTL (IV SOLUTION) ×3 IMPLANT
PACK BASIN DAY SURGERY FS (CUSTOM PROCEDURE TRAY) ×3 IMPLANT
PENCIL BUTTON HOLSTER BLD 10FT (ELECTRODE) ×3 IMPLANT
PIN SAFETY STERILE (MISCELLANEOUS) ×2 IMPLANT
SLEEVE SCD COMPRESS KNEE MED (MISCELLANEOUS) ×4 IMPLANT
SPONGE LAP 18X18 X RAY DECT (DISPOSABLE) ×9 IMPLANT
STAPLER VISISTAT 35W (STAPLE) ×2 IMPLANT
SUT ETHILON 2 0 FS 18 (SUTURE) ×1 IMPLANT
SUT MNCRL AB 3-0 PS2 18 (SUTURE) ×2 IMPLANT
SUT MNCRL AB 4-0 PS2 18 (SUTURE) ×4 IMPLANT
SUT MON AB 5-0 PS2 18 (SUTURE) IMPLANT
SUT PDS 3-0 CT2 (SUTURE)
SUT PDS AB 2-0 CT2 27 (SUTURE) IMPLANT
SUT PDS II 3-0 CT2 27 ABS (SUTURE) ×2 IMPLANT
SUT SILK 2 0 SH (SUTURE) ×2 IMPLANT
SUT SILK 3 0 PS 1 (SUTURE) ×2 IMPLANT
SUT VIC AB 3-0 PS1 18 (SUTURE) ×2
SUT VIC AB 3-0 PS1 18XBRD (SUTURE) ×1 IMPLANT
SUT VIC AB 3-0 SH 27 (SUTURE) ×6
SUT VIC AB 3-0 SH 27X BRD (SUTURE) ×1 IMPLANT
SUT VICRYL 4-0 PS2 18IN ABS (SUTURE) ×4 IMPLANT
SYR BULB IRRIGATION 50ML (SYRINGE) ×4 IMPLANT
SYRINGE CONTROL L 12CC (SYRINGE) ×2 IMPLANT
SYRINGE CONTROL LL 12CC (SYRINGE) IMPLANT
TOWEL OR 17X24 6PK STRL BLUE (TOWEL DISPOSABLE) ×12 IMPLANT
TRAY DSU PREP LF (CUSTOM PROCEDURE TRAY) IMPLANT
TRAY FOLEY CATH 14FR (SET/KITS/TRAYS/PACK) ×1 IMPLANT
TUBE CONNECTING 20X1/4 (TUBING) ×4 IMPLANT
UNDERPAD 30X30 INCONTINENT (UNDERPADS AND DIAPERS) ×6 IMPLANT
VAC PENCILS W/TUBING CLEAR (MISCELLANEOUS) ×1 IMPLANT
YANKAUER SUCT BULB TIP NO VENT (SUCTIONS) ×4 IMPLANT

## 2013-08-04 NOTE — H&P (View-Only) (Signed)
Subjective:     Patient ID: Deanna Buck, female   DOB: [birth year], 68 y.o.   MRN: [id number]  HPI 64 yof with multifocal left breast cancer seen in [place] previously. I recommended left mastectomy with snbx.  We discussed nsm via inframammary incision and she returns today after discussing this with Dr Iran Planas. She has no changes and no complaints today  Review of Systems     Objective:   Physical Exam Left breast with hematoma, periareolar incision, excess skin left axilla, no masses    Assessment:     Clinical stage II left breast cancer     Plan:     Left nipple sparing mastectomy, left axillary sentinel node biopsy     We had a long discussion again  today about her diagnosis and the options. I do think that she needs a mastectomy due to her multifocal disease. I do think also she is a candidate for a nipple sparing mastectomy through an inframammary incision. She has been seen by plastic surgery and has decided to undergo expander reconstruction. We did discuss again today why that would be the case. We discussed the risks of surgery including but not limited to bleeding, infection, flap death, wound issues, nipple death requiring removal and later reconstruction, partial nipple loss, partial or complete loss of nipple sensation. We also discussed the nipple biopsy at the time of surgery to rule out cancer. We discussed the sentinel lymph node biopsy as well. I did discuss with her that if on permanent pathology this comes back there is a chance that she would need to return to the operating room. I'm going to work with plastic surgery to get this scheduled in the next couple weeks. She also has an oncotype that should be back later this week I will followup on.

## 2013-08-04 NOTE — Progress Notes (Signed)
Assisted Dr. Massagee with left, ultrasound guided, pectoralis block. Side rails up, monitors on throughout procedure. See vital signs in flow sheet. Tolerated Procedure well. 

## 2013-08-04 NOTE — Interval H&P Note (Signed)
History and Physical Interval Note:  08/04/2013 7:00 AM  Deanna Buck  has presented today for surgery, with the diagnosis of remove breast tissue, remove lymph nodes under left arm  The various methods of treatment have been discussed with the patient and family. After consideration of risks, benefits and other options for treatment, the patient has consented to  Procedure(s): NIPPLE SPARING MASTECTOMY WITH SENTINAL LYMPH NODE BIOPSY AND  RECONSTRUCTION WITH PLACEMENT OF TISSUE EXPANDER (Left) BREAST RECONSTRUCTION WITH PLACEMENT OF TISSUE EXPANDER AND  POSSIBLE FLEX HD (ACELLULAR HYDRATED DERMIS) (Left) as a surgical intervention .  The patient's history has been reviewed, patient examined, no change in status, stable for surgery.  I have reviewed the patient's chart and labs.  Questions were answered to the patient's satisfaction.     Jeanmarie Mccowen

## 2013-08-04 NOTE — Interval H&P Note (Signed)
History and Physical Interval Note:  08/04/2013 7:09 AM  Deanna Buck  has presented today for surgery, with the diagnosis of remove breast tissue, remove lymph nodes under left arm  The various methods of treatment have been discussed with the patient and family. After consideration of risks, benefits and other options for treatment, the patient has consented to  Procedure(s): NIPPLE SPARING MASTECTOMY WITH SENTINAL LYMPH NODE BIOPSY AND  RECONSTRUCTION WITH PLACEMENT OF TISSUE EXPANDER (Left) BREAST RECONSTRUCTION WITH PLACEMENT OF TISSUE EXPANDER AND  POSSIBLE FLEX HD (ACELLULAR HYDRATED DERMIS) (Left) as a surgical intervention .  The patient's history has been reviewed, patient examined, no change in status, stable for surgery.  I have reviewed the patient's chart and labs.  Questions were answered to the patient's satisfaction.     Phoebe Marter

## 2013-08-04 NOTE — Op Note (Addendum)
Preoperative diagnosis: Multifocal clinical stage I left breast cancer Postoperative diagnosis: Same as above Procedure: #1 left nipple sparing mastectomy #2 injection of blue dye for sentinel node identification #3 left axillary sentinel lymph node biopsy Surgeon: Dr. Serita Grammes Asst.: Dr. [name] Anesthesia: Gen. With pectoral block Estimated blood loss: Minimal Complications: None Drains: Per plastic surgery Specimens: #1 left nipple sparing mastectomy with short superior, long lateral, double marks nipple areola #2 additional retroareolar tissue #3 left retronipple and areolar tissue marked short stitch superior, long stitch lateral, double stitch marks nipple margin #4 Left axillary sentinel node x4 with counts ranging from 89-179 Case turned over to plastic surgery upon completion  Indications:This is a 68 year old female who has originally diagnosed left breast cancer. Upon further evaluation she was noted to have a multifocal left breast cancer. She was seen in our multidisciplinary clinic and we have decided to proceed with a left mastectomy and sentinel node biopsy. She has been seen by plastic surgery we decided to proceed with a nipple sparing mastectomy via an inframammary incision along with the node biopsy. She understands further therapy will be based on the pathology of the surgery.  Procedure: After informed consent was obtained the patient first underwent a pectoral block as well as an injection of technetium in the standard periareolar fashion of the left breast. She was given vancomycin due to her allergies. Sequential compression devices were on her legs. She was in placed under general anesthesia without complication. A Foley catheter was placed. Her breasts were then prepped and draped in the standard sterile surgical fashion. A surgical timeout was then performed.  I did inject a mixture of saline and methylene blue dye in the periareolar area and massaged  for 2 minutes. I then made a inframammary incision that eventually measured about 9 cm. I then used cautery as well as the retractors to remove the breast and the pectoralis fascia from the pectoralis muscle. I did this to the sternum, clavicle, and the latissimus. I used a combination of cautery as well as sharp dissection to remove the breast from the skin. I did this in its entirety. I was able to separate this laterally. There were 4 sentinel lymph nodes that were in the tail of the breast. Several of these were blue and all of these were radioactive. There was no background radioactivity in the axilla when this was completed. I then removed the retroareolar and retronipple tissue. I sent these separately.  I did not do intraoperative pathology on these and decided to wait for permanent. She had a very high concentration of ductal tissue in this area as expected and I did make a small rent in the areola. Upon completion I felt that I had removed all of the breast tissue and the flaps were all viable. I then turned the case over to plastic surgery for reconstruction.

## 2013-08-04 NOTE — Op Note (Signed)
Operative Note   DATE OF OPERATION: 7.23.2015  LOCATION: [place]- observation  SURGICAL DIVISION: Plastic Surgery  PREOPERATIVE DIAGNOSES:  Left breast cancer  POSTOPERATIVE DIAGNOSES:  same  PROCEDURE:  1. Breast reconstruction with tissue expander 2. Acellular dermis (Flex HD) for reconstruction of breast 110 cm2  SURGEON: [name] MD MBA  ASSISTANT: S. Rayburn PA-C  ANESTHESIA:  General.   EBL: 75 ml  COMPLICATIONS: None.   INDICATIONS FOR PROCEDURE:  The patient, Deanna Buck, is a 68 y.o. female born on 01-13-1946, is here for immediate breast reconstruction following left breast nipple sparing mastectomy with tissue expander.   FINDINGS: [place] style 9200 expander, 350 ml. Ref [id number], SN [id number]  Initial fill volume 170 ml  DESCRIPTION OF PROCEDURE:  The patient's was marked in upright position to mark sternal notch, chest midline, breast meridians and anterior axillary line.. The patient was taken to the operating room. SCDs were placed and IV antibiotics were given. The patient's operative site was prepped and draped in a sterile fashion. A time out was performed and all information was confirmed to be correct.  After completion of mastectomy, the cavity was irrigated and hemostasis obtained. The inferior insertions of pectoralis major muscle were divided and submuscular dissection completed. Flex HD was perforated and sewn to inferior border of pectoralis major with running 3-0 vicryl. A 15 Fr drain was placed in subcutaneous position and secured to skin with 2-0 nylon. The cavity was irrigated with solution containing Ancef, genatmicin, and bacitracin. The tissue expander was prepared and placed in submuscular position. The expander was secured to chest wall with a 3-0 vicryl. The inferior border of the acellular dermis was inset to Scarpa's fascia and laterally border was left free. The incision was closed with 3-0 vicryl in fascial layer and 4-0  vicryl in dermis. Skin closure completed with 4-0 monocryl subcuticular and Dermabond. The port was accessed and filled to 170 ml.The patient was brought to upright position and the skin flaps were redraped so that NAC was symmetric from sternal notch and midline. Transparent, adherent dressings applied. Dry dressing and breast binder applied.  The patient was allowed to wake from anesthesia, extubated and taken to the recovery room in satisfactory condition.   SPECIMENS: none  DRAINS: 15 Fr JP in submuscular position  [name], MD [facility] Plastic & Reconstructive Surgery [phone]

## 2013-08-04 NOTE — Anesthesia Preprocedure Evaluation (Signed)
Anesthesia Evaluation  Patient identified by MRN, date of birth, ID band Patient awake    Reviewed: Allergy & Precautions, H&P , NPO status , Patient's Chart, lab work & pertinent test results  History of Anesthesia Complications Negative for: history of anesthetic complications  Airway Mallampati: I  Neck ROM: Full    Dental  (+) Upper Dentures   Pulmonary  breath sounds clear to auscultation        Cardiovascular hypertension, Rhythm:Regular Rate:Normal     Neuro/Psych    GI/Hepatic GERD-  ,  Endo/Other    Renal/GU      Musculoskeletal   Abdominal   Peds  Hematology   Anesthesia Other Findings   Reproductive/Obstetrics                           Anesthesia Physical Anesthesia Plan  ASA: II  Anesthesia Plan: General   Post-op Pain Management:    Induction:   Airway Management Planned: LMA and Oral ETT  Additional Equipment:   Intra-op Plan:   Post-operative Plan: Extubation in OR  Informed Consent: I have reviewed the patients History and Physical, chart, labs and discussed the procedure including the risks, benefits and alternatives for the proposed anesthesia with the patient or authorized representative who has indicated his/her understanding and acceptance.   Dental advisory given  Plan Discussed with: CRNA and Surgeon  Anesthesia Plan Comments:         Anesthesia Quick Evaluation

## 2013-08-04 NOTE — Anesthesia Postprocedure Evaluation (Signed)
  Anesthesia Post-op Note  Patient: Deanna Buck  Procedure(s) Performed: Procedure(s): NIPPLE SPARING MASTECTOMY WITH SENTINAL LYMPH NODE BIOPSY AND  RECONSTRUCTION WITH PLACEMENT OF TISSUE EXPANDER (Left) BREAST RECONSTRUCTION WITH PLACEMENT OF TISSUE EXPANDER AND  POSSIBLE FLEX HD (ACELLULAR HYDRATED DERMIS) (Left)  Patient Location: PACU  Anesthesia Type:General  Level of Consciousness: awake and alert   Airway and Oxygen Therapy: Patient Spontanous Breathing  Post-op Pain: mild  Post-op Assessment: Post-op Vital signs reviewed  Post-op Vital Signs: stable  Last Vitals:  Filed Vitals:   08/04/13 1230  BP: 141/68  Pulse: 57  Temp: 36.1 C  Resp: 12    Complications: No apparent anesthesia complications

## 2013-08-04 NOTE — Transfer of Care (Signed)
Immediate Anesthesia Transfer of Care Note  Patient: Deanna Buck  Procedure(s) Performed: Procedure(s): NIPPLE SPARING MASTECTOMY WITH SENTINAL LYMPH NODE BIOPSY AND  RECONSTRUCTION WITH PLACEMENT OF TISSUE EXPANDER (Left) BREAST RECONSTRUCTION WITH PLACEMENT OF TISSUE EXPANDER AND  POSSIBLE FLEX HD (ACELLULAR HYDRATED DERMIS) (Left)  Patient Location: PACU  Anesthesia Type:GA combined with regional for post-op pain  Level of Consciousness: awake and patient cooperative  Airway & Oxygen Therapy: Patient Spontanous Breathing and Patient connected to face mask oxygen  Post-op Assessment: Report given to PACU RN and Post -op Vital signs reviewed and stable  Post vital signs: Reviewed and stable  Complications: No apparent anesthesia complications

## 2013-08-04 NOTE — Anesthesia Procedure Notes (Addendum)
Anesthesia Regional Block:  Pectoralis block  Pre-Anesthetic Checklist: ,, timeout performed, Correct Patient, Correct Site, Correct Laterality, Correct Procedure, Correct Position, site marked, Risks and benefits discussed,  Surgical consent,  Pre-op evaluation,  At surgeon's request and post-op pain management  Laterality: Left and Upper  Prep: chloraprep       Needles:   Needle Type: Echogenic Needle     Needle Length: 9cm 9 cm Needle Gauge: 21 and 21 G  Needle insertion depth: 5 cm   Additional Needles:  Procedures: ultrasound guided (picture in chart) Pectoralis block Narrative:  Start time: 08/04/2013 7:10 AM End time: 08/04/2013 7:24 AM Injection made incrementally with aspirations every 5 mL.  Performed by: Personally  Anesthesiologist: TMassagee  Additional Notes: Tolerated well   Anesthesia Procedure Note  Procedure Name: Intubation Date/Time: 08/04/2013 7:46 AM Performed by: Toula Moos L Pre-anesthesia Checklist: Patient identified, Emergency Drugs available, Suction available, Patient being monitored and Timeout performed Patient Re-evaluated:Patient Re-evaluated prior to inductionOxygen Delivery Method: Circle System Utilized Preoxygenation: Pre-oxygenation with 100% oxygen Intubation Type: IV induction Ventilation: Mask ventilation without difficulty Laryngoscope Size: Miller and 3 Grade View: Grade I Tube type: Oral Tube size: 7.0 mm Number of attempts: 1 Airway Equipment and Method: stylet and oral airway Placement Confirmation: ETT inserted through vocal cords under direct vision,  positive ETCO2 and breath sounds checked- equal and bilateral Secured at: 21 cm Tube secured with: Tape Dental Injury: Teeth and Oropharynx as per pre-operative assessment

## 2013-08-05 ENCOUNTER — Encounter (HOSPITAL_BASED_OUTPATIENT_CLINIC_OR_DEPARTMENT_OTHER): Payer: Self-pay | Admitting: General Surgery

## 2013-08-05 MED ORDER — CLINDAMYCIN PHOSPHATE 300 MG/50ML IV SOLN
INTRAVENOUS | Status: AC
Start: 2013-08-05 — End: 2013-08-05
  Filled 2013-08-05: qty 50

## 2013-08-05 MED ORDER — OXYCODONE-ACETAMINOPHEN 5-325 MG PO TABS: 1.0000 | ORAL_TABLET | ORAL | Status: DC | PRN

## 2013-08-05 MED ORDER — CLINDAMYCIN HCL 300 MG PO CAPS: 300.0000 mg | ORAL_CAPSULE | Freq: Three times a day (TID) | ORAL | Status: DC

## 2013-08-09 ENCOUNTER — Other Ambulatory Visit: Payer: Self-pay | Admitting: Oncology

## 2013-08-19 ENCOUNTER — Encounter: Payer: Self-pay | Admitting: *Deleted

## 2013-08-19 ENCOUNTER — Ambulatory Visit (HOSPITAL_BASED_OUTPATIENT_CLINIC_OR_DEPARTMENT_OTHER): Payer: BC Managed Care – PPO | Admitting: Oncology

## 2013-08-19 VITALS — BP 150/59 | HR 65 | Temp 98.5°F | Resp 18 | Ht 64.0 in | Wt 155.2 lb

## 2013-08-19 DIAGNOSIS — Z170 Estrogen receptor positive status [ER+]: Secondary | ICD-10-CM

## 2013-08-19 DIAGNOSIS — C50919 Malignant neoplasm of unspecified site of unspecified female breast: Secondary | ICD-10-CM

## 2013-08-19 DIAGNOSIS — C50412 Malignant neoplasm of upper-outer quadrant of left female breast: Secondary | ICD-10-CM

## 2013-08-19 DIAGNOSIS — C50419 Malignant neoplasm of upper-outer quadrant of unspecified female breast: Secondary | ICD-10-CM

## 2013-08-19 NOTE — Progress Notes (Signed)
Deanna Buck  [phone] Fax:(336) [phone]     ID: Deanna Buck DOB: August 06, 1945  MR#: [id number]  GDJ#:[id number]  Patient Care Team: Rowe Clack, MD as PCP - General Daria Pastures, MD as Consulting Physician (Obstetrics and Gynecology) Jarome Matin, MD as Consulting Physician (Dermatology) Rolm Bookbinder M.D., Gery Pray, Arnoldo Hooker [place]  CHIEF COMPLAINT: Early stage multifocal breast cancer  CURRENT TREATMENT: Awaiting definitive surgery   BREAST CANCER HISTORY: Trenice had routine yearly screening mammography with tomography 05/27/2013 at the [facility] Center, showing a possible area of distortion in the left breast. Left diagnostic mammography and ultrasonography 06/13/2013 showed the breast density to be category C. In the upper outer quadrant of the left breast there was a mass measuring approximately 1.8 cm. It was not palpable on exam. Ultrasound showed a hypoechoic irregular mass measuring 1.3 cm correlating with the mammographic finding. In addition, a smaller, 1.0 cm mass was detected approximately 3 cm from the index lesion. The left axilla was negative.  Biopsy of both masses in the left breast 06/22/2013 showed ([id number]) morphologically similar invasive mammary carcinoma, grade 1 or 2, estrogen receptor positive at 93%, progesterone receptor positive at 91%, both with strong staining intensity, with an MIB-1 of 11%, and no HER-2 amplification, the signals ratio being 1.03 and the number per cell 1.55.  On 06/29/2013 the patient underwent bilateral breast MRI at [facility]. By MRI the larger left breast mass measured 3.3 cm. The second, smaller mass measured 1.1 cm. Combined the masses measures 3.9 cm maximally. [By mammography, the masses were 2.4 cm apart with a combined diameter of 4.9 cm.]  The patient's subsequent history is as detailed below  INTERVAL HISTORY: Deanna Buck returns today for followup of her breast cancer  accompanied by her husband Deanna Buck and her daughter Deanna Buck. 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 Margins were ample. Repeat HER-2 was again negative. The patient had breast reconstruction with immediate tissue expander placement. She also had an Oncotype sent, showing a score of 13, predicting a five-year risk of recurrence of 10% if her only systemic treatment was tamoxifen for 5 years, and of 11% if chemotherapy (CAF) was added to tamoxifen. Her case was discussed at the 08/17/2013 multidisciplinary breast cancer conference and it was felt at that time that the patient did not require further nodal dissection, as per NCCN guidelines. She would need radiation only for local control. As far as chemotherapy is concerned the S1007 study was suggested.  REVIEW OF SYSTEMS: Deanna Buck tolerated surgery moderately well. She still has a drain in place, and is taking oxycodone at bedtime to help her sleep. She had nausea for 1 or 2 days but that has resolved. She describes herself as mildly fatigued. She is having some urinary changes, but no blood in her urine, frequency, or dysuria. There have been no fevers, rash, or bleeding. She is concerned that part of the skin over her reconstructed breast may would not make it". A detailed review of systems today was otherwise stable  PAST MEDICAL HISTORY: Past Medical History  Diagnosis Date  . ECZEMA   . PERIPHERAL EDEMA   . ANXIETY   . GERD   . HYPERTENSION   . OA (osteoarthritis)   . Abnormal mammogram 05/2013    L breast  . Full dentures   .  Wears contact lenses     also wears glasses    PAST SURGICAL HISTORY: Past Surgical History  Procedure Laterality Date  . 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 Breast  lumpectomy  1982    rt and lt benign  . Breast reconstruction with placement of tissue expander and flex hd (acellular hydrated dermis) Left 08/04/2013    Procedure: BREAST RECONSTRUCTION WITH PLACEMENT OF TISSUE EXPANDER AND  POSSIBLE FLEX HD (ACELLULAR HYDRATED DERMIS);  Surgeon: [name], MD;  Location: [facility];  Service: Plastics;  Laterality: Left;    FAMILY HISTORY Family History  Problem Relation Age of Onset  . Breast cancer Mother   . Dementia Mother   . Arthritis Other   . Hypertension Other     parent  . Hyperlipidemia Other     parent  . Kidney disease Other     parent   the patient's father died at the age of 27 with congestive heart failure. The patient's mother died at the age of 23. She was diagnosed with breast cancer at the age of 59. The only other breast cancer in the family was on the opposite, father's side, a first cousin diagnosed in her 53s. The patient had one brother who died from a myocardial infarction. She had no sisters.  GYNECOLOGIC HISTORY:  No LMP recorded. Patient is postmenopausal. Menarche age 45, first live birth age 30, the patient is [place] P2. She underwent menopause approximately 2002. She did not take hormone replacement. She did take birth control pills remotely for approximately 13 years, with no complications.  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 Her children from a prior marriage are Deanna Buck, who is an Optometrist, and Deanna Buck, who is also an Optometrist. Both live in [place]. The patient's husband Deanna Buck work for [employer]. The patient has 2 grandchildren. She is not a Ambulance person.    ADVANCED DIRECTIVES: In place   HEALTH MAINTENANCE: History  Substance Use Topics  . Smoking status: Never Smoker   . Smokeless tobacco: Not on file     Comment: Married, but in stressful relationship with spouse  . Alcohol Use: Yes     Comment: rare glass wine      Colonoscopy:  PAP:  Bone density: 03/31/2011 ad lib. are showing significant osteopenia, with the lowest T score at -2.4  Lipid panel:  Allergies  Allergen Reactions  . Morphine And Related Shortness Of Breath  . Sulfonamide Derivatives Hives and Itching  . Penicillins Other (See Comments)    Dr. Sherolyn Buba told her she was allergic to PCN.    Current Outpatient Prescriptions  Medication Sig Dispense Refill  . ALPRAZolam (XANAX) 0.5 MG tablet Take 1 tablet (0.5 mg total) by mouth 3 (three) times daily as needed for anxiety.  30 tablet  1  . clindamycin (CLEOCIN) 300 MG capsule Take 1 capsule (300 mg total) by mouth 3 (three) times daily.  18 capsule  0  . hydrochlorothiazide (HYDRODIURIL) 25 MG tablet TAKE 1 TABLET BY MOUTH DAILY  90 tablet  3  . nadolol (CORGARD) 40 MG tablet Take 0.5 tablets (20 mg total) by mouth daily.  45 tablet  3  . omeprazole (PRILOSEC) 20 MG capsule Take 20 mg by mouth as needed.      Marland Kitchen oxyCODONE-acetaminophen (PERCOCET/ROXICET) 5-325 MG per tablet Take 1-2 tablets by mouth every 4 (  four) hours as needed for moderate pain.  50 tablet  0  . UNABLE TO FIND 174.4 Left Mastectomy  L8015-Post-Mastectomy Garment-2  1 each  0  . valACYclovir (VALTREX) 1000 MG tablet Take 1 tablet (1,000 mg total) by mouth daily as needed.  30 tablet  0  . vitamin B-12 (CYANOCOBALAMIN) 100 MCG tablet Take 50 mcg by mouth daily.       No current facility-administered medications for this visit.    OBJECTIVE:  Middle-aged white woman who appears stated age 15 Vitals:   08/19/13 1107  BP: 150/59  Pulse: 65  Temp: 98.5 F (36.9 C)  Resp: 18     Body mass index is 26.63 kg/(m^2).    ECOG FS:1 - Symptomatic but completely ambulatory  Ocular: Sclerae unicteric, EOMs intact Ear-nose-throat: Oropharynx clear, teeth in good repair r Lymphatic: No cervical or supraclavicular adenopathy Lungs no rales or rhonchi, good excursion bilaterally Heart regular rate and rhythm,  no murmur appreciated Abd soft, nontender, positive bowel sounds MSK no focal spinal tenderness, no upper extremity lymphedema Neuro: non-focal, well-oriented, appropriate affect Breasts: The right breast is unremarkable. The left breast is status post mastectomy with expander in place. In the lateral aspect of the breast there is an area measuring approximately 3 x 3 cm where the skin has turned black. It is not clear if this is forming an eschar over simply necrosing. I do not palpate any axillary adenopathy on the left   LAB RESULTS:  CMP     Component Value Date/Time   NA 140 08/01/2013 1210   NA 143 06/29/2013 1224   K 4.2 08/01/2013 1210   K 3.4* 06/29/2013 1224   CL 103 08/01/2013 1210   CO2 27 08/01/2013 1210   CO2 28 06/29/2013 1224   GLUCOSE 95 08/01/2013 1210   GLUCOSE 113 06/29/2013 1224   BUN 17 08/01/2013 1210   BUN 14.3 06/29/2013 1224   CREATININE 0.69 08/01/2013 1210   CREATININE 0.8 06/29/2013 1224   CALCIUM 10.2 08/01/2013 1210   CALCIUM 10.6* 06/29/2013 1224   PROT 7.5 06/29/2013 1224   PROT 7.2 06/23/2013 0936   ALBUMIN 4.0 06/29/2013 1224   ALBUMIN 4.0 06/23/2013 0936   AST 17 06/29/2013 1224   AST 17 06/23/2013 0936   ALT 10 06/29/2013 1224   ALT 14 06/23/2013 0936   ALKPHOS 101 06/29/2013 1224   ALKPHOS 95 06/23/2013 0936   BILITOT 1.34* 06/29/2013 1224   BILITOT 1.2 06/23/2013 0936   GFRNONAA 88* 08/01/2013 1210   GFRAA >90 08/01/2013 1210    I No results found for this basename: SPEP,  UPEP,   kappa and lambda light chains    Lab Results  Component Value Date   WBC 8.5 06/29/2013   NEUTROABS 5.8 06/29/2013   HGB 13.1 06/29/2013   HCT 38.8 06/29/2013   MCV 86.6 06/29/2013   PLT 252 06/29/2013      Chemistry      Component Value Date/Time   NA 140 08/01/2013 1210   NA 143 06/29/2013 1224   K 4.2 08/01/2013 1210   K 3.4* 06/29/2013 1224   CL 103 08/01/2013 1210   CO2 27 08/01/2013 1210   CO2 28 06/29/2013 1224   BUN 17 08/01/2013 1210   BUN 14.3 06/29/2013 1224    CREATININE 0.69 08/01/2013 1210   CREATININE 0.8 06/29/2013 1224      Component Value Date/Time   CALCIUM 10.2 08/01/2013 1210   CALCIUM 10.6* 06/29/2013 1224  ALKPHOS 101 06/29/2013 1224   ALKPHOS 95 06/23/2013 0936   AST 17 06/29/2013 1224   AST 17 06/23/2013 0936   ALT 10 06/29/2013 1224   ALT 14 06/23/2013 0936   BILITOT 1.34* 06/29/2013 1224   BILITOT 1.2 06/23/2013 0936       No results found for this basename: LABCA2    No components found with this basename: LABCA125    No results found for this basename: INR,  in the last 168 hours  Urinalysis    Component Value Date/Time   COLORURINE YELLOW 06/23/2013 [place] 06/23/2013 0936   LABSPEC 1.020 06/23/2013 0936   PHURINE 7.0 06/23/2013 0936   GLUCOSEU NEGATIVE 06/23/2013 0936   GLUCOSEU NEGATIVE 12/26/2008 1615   HGBUR NEGATIVE 06/23/2013 0936   HGBUR negative 09/11/2009 1413   BILIRUBINUR NEGATIVE 06/23/2013 0936   BILIRUBINUR neg 02/17/2011 1625   KETONESUR NEGATIVE 06/23/2013 0936   PROTEINUR neg 02/17/2011 1625   PROTEINUR NEGATIVE 12/26/2008 1615   UROBILINOGEN 0.2 06/23/2013 0936   UROBILINOGEN 0.2 02/17/2011 1625   NITRITE NEGATIVE 06/23/2013 0936   NITRITE pos 02/17/2011 1625   LEUKOCYTESUR NEGATIVE 06/23/2013 0936    STUDIES: Nm Sentinel Node Inj-no Rpt (breast)  08/04/2013   CLINICAL DATA: left axillary snbx   Sulfur colloid was injected intradermally by the nuclear medicine  technologist for breast cancer sentinel node localization.     ASSESSMENT: 68 y.o. [place] woman status post biopsy of 2 separate left breast masses 06/22/2013 for a clinical mT1c N0, stage IA carcinoma, grade 1 or 2, estrogen receptor 93% positive, progesterone receptor 91% positive, with an MIB-1 of 11%, and no HER-2 amplification  PLAN: Deanna Buck was understandably overwhelmed today. We spent approximately 50 minutes going over her situation in detail. Because of her low Oncotype score she thought she would not need chemotherapy,  and because she underwent a mastectomy she holds she would not need radiation. That certainly would be the case if she were node negative, but in node-positive patients like her chemotherapy and radiation is the standard of care.  Accordingly we discussed chemotherapy, which in her case would consist of cyclophosphamide and docetaxel given every 3 weeks x4. She has a good understanding of the possible toxicities, side effects and complications of these agents. I have also set her up for "chemotherapy school". Within the next week.  However she does qualify for our S1007 study. This basically randomizes women like her to chemotherapy versus no chemotherapy. All patients take anti-estrogen for at least 5 years. She is very interested in the study, , received a copy of the consent form, which she will be reading over the weekend, and she will call our study nurse, Deanna Buck, next week, to get any questions or concerns answered, at which point hopefully she will be randomized.  Once we know whether or not she will receive chemotherapy according to the study, we can proceed to operationalized the decision. If she needs chemotherapy next step would be port placement. She does not need chemotherapy she will be referred to radiation oncology.  Deanna Buck  has a good understanding of the overall plan. She agrees with it. She knows the goal of treatment in her case is cure. She will call with any problems that may develop before her next visit here.  Chauncey Cruel, MD   08/19/2013 11:21 AM

## 2013-08-23 ENCOUNTER — Telehealth: Payer: Self-pay | Admitting: Oncology

## 2013-08-23 NOTE — Telephone Encounter (Signed)
Lvm advising 8/13 chemo ed class and 8/20 md appt.

## 2013-08-24 ENCOUNTER — Telehealth (INDEPENDENT_AMBULATORY_CARE_PROVIDER_SITE_OTHER): Payer: Self-pay

## 2013-08-24 ENCOUNTER — Telehealth: Payer: Self-pay | Admitting: *Deleted

## 2013-08-24 NOTE — Telephone Encounter (Signed)
No additional note

## 2013-08-24 NOTE — Telephone Encounter (Signed)
Called pt to make her an appt to see Dr Donne Hazel for 8/14 arrive at 8:15/8:30. Pt asked if I would call the [facility] to r/s her chemo class from 8/13 to next week. I called and r/s the chemo class to 8/20 at 12:15/12:30 before she see's Dr Jana Hakim. Pt understands.

## 2013-08-25 ENCOUNTER — Other Ambulatory Visit: Payer: BC Managed Care – PPO

## 2013-08-26 ENCOUNTER — Encounter (INDEPENDENT_AMBULATORY_CARE_PROVIDER_SITE_OTHER): Payer: Self-pay | Admitting: General Surgery

## 2013-08-26 ENCOUNTER — Telehealth (INDEPENDENT_AMBULATORY_CARE_PROVIDER_SITE_OTHER): Payer: Self-pay

## 2013-08-26 ENCOUNTER — Ambulatory Visit (INDEPENDENT_AMBULATORY_CARE_PROVIDER_SITE_OTHER): Payer: BC Managed Care – PPO | Admitting: General Surgery

## 2013-08-26 VITALS — BP 130/60 | Ht 64.0 in | Wt 156.0 lb

## 2013-08-26 DIAGNOSIS — C50912 Malignant neoplasm of unspecified site of left female breast: Secondary | ICD-10-CM

## 2013-08-26 DIAGNOSIS — Z09 Encounter for follow-up examination after completed treatment for conditions other than malignant neoplasm: Secondary | ICD-10-CM

## 2013-08-26 NOTE — Telephone Encounter (Signed)
Placed order in epic for PT referral.

## 2013-08-26 NOTE — Telephone Encounter (Signed)
Message copied by Illene Regulus on Fri Aug 26, 2013  4:28 PM ------      Message from: [place], [place]      Created: Fri Aug 26, 2013  8:44 AM       Could you refer her to pt       ------

## 2013-08-26 NOTE — Progress Notes (Signed)
Subjective:     Patient ID: Deanna Buck, female   DOB: 17-Sep-1945, 68 y.o.   MRN: [id number]  HPI 57 yof s/p left mastectomy and snbx for what is now pathologic T2N1a tumor.  She did not have nodal involvement preoperatively.  This is er/pr positive, low Ki and her 2 not amplified. She had oncotype on core that was 13.  She has some eschar on left breast after a nsm with immediate expander reconstruction that needs to be debrided.  She is otherwise doing well. She is trying to decide how she wants to proceed with adjuvant therapy and is here with her husband today.  Review of Systems     Objective:   Physical Exam Left mastectomy incision clean, nipple/areola appear viable, there is a 4x3 cm eschar without infection lateral to nac    Assessment:     Stage II left breast cancer     Plan:     We discussed pathology.  I told her possibly returning to do alnd but I think with clinically negative axilla pending systemic treatment and radiotherapy and now according to nccn guidelines omitting this is possibility .  We discussed risks and benefits and have decided not to proceed with alnd.  She is also considering systemic therapy.  She is chemo and AE, study to get randomized to chemo or ae alone, or just taking AE.  We discussed this at length today.  She is going to speak to Rolley Sims the study nurse and will see Gus Magrinat next week. I don't think she will need a port placed for now and Dr Iran Planas can proceed with debridement.

## 2013-08-29 ENCOUNTER — Telehealth: Payer: Self-pay | Admitting: *Deleted

## 2013-08-29 ENCOUNTER — Other Ambulatory Visit: Payer: Self-pay | Admitting: Internal Medicine

## 2013-08-29 ENCOUNTER — Encounter (HOSPITAL_BASED_OUTPATIENT_CLINIC_OR_DEPARTMENT_OTHER): Payer: Self-pay | Admitting: *Deleted

## 2013-08-29 NOTE — Progress Notes (Signed)
No new labs needed 

## 2013-08-29 NOTE — Telephone Encounter (Signed)
This RN spoke with pt per her call inquiring " do I need to keep the chemo edu class if I have decided not to do chemotherapy "  Per discussion - Deanna Buck states she has debated over situation and does not want to do the chemo perspective- she does want to proceed with radiation and antiestrogens.  Per discussion appt for chemo edu will be cancelle. Takeesha will keep appointment with Dr Jannifer Rodney for 8/20 at 230 pm.

## 2013-08-29 NOTE — H&P (Signed)
  Subjective:    Patient ID: Deanna Buck is a 68 y.o. female.  HPI  4 weeks post op. Anticipate XRT to left chest and axilla. Seen by Dr. Jana Hakim and discussed [phone] trial which would randomize to chemo or no chemo, hormonal treatment alone. Discussed her low oncotype scar and what this may mean for effectiveness of chemo. Patient reports she does not want to have chemo, but hard to make this decision herself.   Drain 46/40/35   Pathology with 2/4 SLN positive and final 3.6 cm area of IDC. Oncotype pending. Pt presented on screening MMG with abnomal left breast. Noted to have a 2 separate lesions one being in the 12:00 position 1.8 x1.3 cm, in the second lesion in the 1:00 position. 1.3 x 1.2 cm. Ultrasound-guided biopsy revealed at both locationenvasive mammary carcinoma. ER/PR+, no amplification of HER-2 detected. MRI of the breast area confirmed 2 separate lesions the largest being 3.3 cm in the 1:00 position. The second lesion in the 12:00 position measured 1.1 by.   Has had bilateral breast excisions in past for benign disease via periareolar incisions. Prior 45 D or DD.   Review of Systems     Objective:    Physical Exam  Dry black eschar unchanged 2 x 3 cm greatest, no cellulitis, skin flap soft. Surrounding eschar is viable skin that has had epidermolysis. PULM: clear CV: regular, nl heart sounds Assessment:      Multifocal left breast ca.     Plan:      Continue drain and compression, light activities. Did provide Rx for EMLA to use with further expansions as she reports good sensation in area of port. Suspect full thickness necrosis lateral to NAC. Will plan OP procedure to excise.At this time, no Port is planned and will proceed with debridement.   We reviewed that second stage will be delayed until completion chemo and XRT and rec waiting 6 months post latter for exchange. Reviewed that XRT will increase risks of reconstruction inc wound healing problems and  capsular contracture.   Mentor style 9200 expander, 350 ml.  Fill volume 268ml   [name], MD [facility] Plastic & Reconstructive Surgery [phone]

## 2013-08-30 ENCOUNTER — Ambulatory Visit (HOSPITAL_BASED_OUTPATIENT_CLINIC_OR_DEPARTMENT_OTHER)
Admission: RE | Admit: 2013-08-30 | Discharge: 2013-08-30 | Disposition: A | Discharge status: Home / Self Care | Payer: BC Managed Care – PPO | Source: Ambulatory Visit | Attending: Plastic Surgery | Admitting: Plastic Surgery

## 2013-08-30 ENCOUNTER — Ambulatory Visit (HOSPITAL_BASED_OUTPATIENT_CLINIC_OR_DEPARTMENT_OTHER): Payer: BC Managed Care – PPO | Admitting: Anesthesiology

## 2013-08-30 ENCOUNTER — Encounter (HOSPITAL_BASED_OUTPATIENT_CLINIC_OR_DEPARTMENT_OTHER)
Admission: RE | Disposition: A | Discharge status: Home / Self Care | Payer: Self-pay | Source: Ambulatory Visit | Attending: Plastic Surgery

## 2013-08-30 ENCOUNTER — Encounter (HOSPITAL_BASED_OUTPATIENT_CLINIC_OR_DEPARTMENT_OTHER): Payer: Self-pay | Admitting: *Deleted

## 2013-08-30 ENCOUNTER — Encounter (HOSPITAL_BASED_OUTPATIENT_CLINIC_OR_DEPARTMENT_OTHER): Payer: BC Managed Care – PPO | Admitting: Anesthesiology

## 2013-08-30 DIAGNOSIS — C50919 Malignant neoplasm of unspecified site of unspecified female breast: Secondary | ICD-10-CM | POA: Insufficient documentation

## 2013-08-30 DIAGNOSIS — C50912 Malignant neoplasm of unspecified site of left female breast: Secondary | ICD-10-CM

## 2013-08-30 DIAGNOSIS — N6489 Other specified disorders of breast: Secondary | ICD-10-CM | POA: Insufficient documentation

## 2013-08-30 DIAGNOSIS — N641 Fat necrosis of breast: Secondary | ICD-10-CM | POA: Diagnosis not present

## 2013-08-30 DIAGNOSIS — Z9010 Acquired absence of unspecified breast and nipple: Secondary | ICD-10-CM | POA: Insufficient documentation

## 2013-08-30 HISTORY — PX: INCISION AND DRAINAGE OF WOUND: SHX1803

## 2013-08-30 LAB — POCT HEMOGLOBIN-HEMACUE: Hemoglobin: 13.6 g/dL (ref 12.0–15.0)

## 2013-08-30 SURGERY — IRRIGATION AND DEBRIDEMENT WOUND
Anesthesia: General | Site: Breast | Laterality: Left

## 2013-08-30 MED ORDER — FENTANYL CITRATE 0.05 MG/ML IJ SOLN
INTRAMUSCULAR | Status: AC
  Filled 2013-08-30: qty 6

## 2013-08-30 MED ORDER — CLINDAMYCIN PHOSPHATE 600 MG/50ML IV SOLN
600.0000 mg | Freq: Once | INTRAVENOUS | Status: AC
  Administered 2013-08-30: 600 mg via INTRAVENOUS

## 2013-08-30 MED ORDER — SUCCINYLCHOLINE CHLORIDE 20 MG/ML IJ SOLN
INTRAMUSCULAR | Status: DC | PRN
  Administered 2013-08-30: 100 mg via INTRAVENOUS

## 2013-08-30 MED ORDER — FENTANYL CITRATE 0.05 MG/ML IJ SOLN
25.0000 ug | INTRAMUSCULAR | Status: DC | PRN
  Administered 2013-08-30: 50 ug via INTRAVENOUS
  Administered 2013-08-30: 25 ug via INTRAVENOUS

## 2013-08-30 MED ORDER — MIDAZOLAM HCL 2 MG/2ML IJ SOLN
INTRAMUSCULAR | Status: AC
  Filled 2013-08-30: qty 2

## 2013-08-30 MED ORDER — OXYCODONE-ACETAMINOPHEN 5-325 MG PO TABS
ORAL_TABLET | ORAL | Status: AC
  Filled 2013-08-30: qty 1

## 2013-08-30 MED ORDER — CLINDAMYCIN PHOSPHATE 600 MG/50ML IV SOLN
INTRAVENOUS | Status: AC
  Filled 2013-08-30: qty 50

## 2013-08-30 MED ORDER — LIDOCAINE HCL (CARDIAC) 20 MG/ML IV SOLN
INTRAVENOUS | Status: DC | PRN
  Administered 2013-08-30: 80 mg via INTRAVENOUS

## 2013-08-30 MED ORDER — OXYCODONE-ACETAMINOPHEN 5-325 MG PO TABS
1.0000 | ORAL_TABLET | Freq: Once | ORAL | Status: AC | PRN
  Administered 2013-08-30: 1 via ORAL

## 2013-08-30 MED ORDER — OXYCODONE-ACETAMINOPHEN 5-325 MG PO TABS: 1.0000 | ORAL_TABLET | ORAL | Status: DC | PRN

## 2013-08-30 MED ORDER — FENTANYL CITRATE 0.05 MG/ML IJ SOLN
INTRAMUSCULAR | Status: AC
  Filled 2013-08-30: qty 2

## 2013-08-30 MED ORDER — PROPOFOL 10 MG/ML IV BOLUS
INTRAVENOUS | Status: DC | PRN
  Administered 2013-08-30: 200 mg via INTRAVENOUS

## 2013-08-30 MED ORDER — MIDAZOLAM HCL 2 MG/2ML IJ SOLN
1.0000 mg | INTRAMUSCULAR | Status: DC | PRN
  Administered 2013-08-30: 2 mg via INTRAVENOUS

## 2013-08-30 MED ORDER — DEXAMETHASONE SODIUM PHOSPHATE 4 MG/ML IJ SOLN
INTRAMUSCULAR | Status: DC | PRN
  Administered 2013-08-30: 10 mg via INTRAVENOUS

## 2013-08-30 MED ORDER — FENTANYL CITRATE 0.05 MG/ML IJ SOLN
INTRAMUSCULAR | Status: DC | PRN
  Administered 2013-08-30 (×2): 25 ug via INTRAVENOUS
  Administered 2013-08-30 (×2): 50 ug via INTRAVENOUS

## 2013-08-30 MED ORDER — ONDANSETRON HCL 4 MG/2ML IJ SOLN
INTRAMUSCULAR | Status: DC | PRN
  Administered 2013-08-30: 4 mg via INTRAVENOUS

## 2013-08-30 MED ORDER — ONDANSETRON HCL 4 MG/2ML IJ SOLN
4.0000 mg | Freq: Once | INTRAMUSCULAR | Status: DC | PRN
Start: 2013-08-30 — End: 2013-08-30

## 2013-08-30 MED ORDER — SODIUM CHLORIDE 0.9 % IR SOLN
Status: DC | PRN
  Administered 2013-08-30: 75 mL

## 2013-08-30 MED ORDER — LACTATED RINGERS IV SOLN
INTRAVENOUS | Status: DC
  Administered 2013-08-30 (×2): via INTRAVENOUS

## 2013-08-30 MED ORDER — EPHEDRINE SULFATE 50 MG/ML IJ SOLN
INTRAMUSCULAR | Status: DC | PRN
  Administered 2013-08-30 (×2): 5 mg via INTRAVENOUS

## 2013-08-30 MED ORDER — FENTANYL CITRATE 0.05 MG/ML IJ SOLN: 50.0000 ug | INTRAMUSCULAR | Status: DC | PRN

## 2013-08-30 MED ORDER — CLINDAMYCIN HCL 300 MG PO CAPS: 300.0000 mg | ORAL_CAPSULE | Freq: Three times a day (TID) | ORAL | Status: DC

## 2013-08-30 SURGICAL SUPPLY — 53 items
ADH SKN CLS APL DERMABOND .7 (GAUZE/BANDAGES/DRESSINGS) ×1
BAG DECANTER FOR FLEXI CONT (MISCELLANEOUS) ×2 IMPLANT
BLADE HEX COATED 2.75 (ELECTRODE) ×2 IMPLANT
BLADE SURG 10 STRL SS (BLADE) ×1 IMPLANT
BLADE SURG 15 STRL LF DISP TIS (BLADE) IMPLANT
BLADE SURG 15 STRL SS (BLADE) ×2
BNDG GAUZE ELAST 4 BULKY (GAUZE/BANDAGES/DRESSINGS) IMPLANT
CANISTER SUCT 1200ML W/VALVE (MISCELLANEOUS) ×2 IMPLANT
CHLORAPREP W/TINT 26ML (MISCELLANEOUS) ×1 IMPLANT
COVER MAYO STAND STRL (DRAPES) ×2 IMPLANT
COVER TABLE BACK 60X90 (DRAPES) ×2 IMPLANT
DECANTER SPIKE VIAL GLASS SM (MISCELLANEOUS) IMPLANT
DERMABOND ADVANCED (GAUZE/BANDAGES/DRESSINGS) ×1
DERMABOND ADVANCED .7 DNX12 (GAUZE/BANDAGES/DRESSINGS) IMPLANT
DRAIN CHANNEL 15F RND FF W/TCR (WOUND CARE) ×1 IMPLANT
DRAPE LAPAROSCOPIC ABDOMINAL (DRAPES) ×2 IMPLANT
DRAPE U-SHAPE 76X120 STRL (DRAPES) IMPLANT
DRSG PAD ABDOMINAL 8X10 ST (GAUZE/BANDAGES/DRESSINGS) ×1 IMPLANT
DRSG TELFA 3X8 NADH (GAUZE/BANDAGES/DRESSINGS) ×2 IMPLANT
ELECT COATED BLADE 2.86 ST (ELECTRODE) IMPLANT
ELECT REM PT RETURN 9FT ADLT (ELECTROSURGICAL) ×2
ELECTRODE REM PT RTRN 9FT ADLT (ELECTROSURGICAL) ×1 IMPLANT
EVACUATOR SILICONE 100CC (DRAIN) ×1 IMPLANT
GAUZE SPONGE 4X4 12PLY STRL (GAUZE/BANDAGES/DRESSINGS) IMPLANT
GLOVE BIO SURGEON STRL SZ 6 (GLOVE) ×3 IMPLANT
GLOVE BIO SURGEON STRL SZ 6.5 (GLOVE) IMPLANT
GOWN STRL REUS W/ TWL LRG LVL3 (GOWN DISPOSABLE) ×2 IMPLANT
GOWN STRL REUS W/TWL LRG LVL3 (GOWN DISPOSABLE) ×4
IMPL BREAST TIS EXP M 350CC (Breast) IMPLANT
IMPLANT BREAST TIS EXP M 350CC (Breast) ×2 IMPLANT
NDL HYPO 25X1 1.5 SAFETY (NEEDLE) IMPLANT
NEEDLE HYPO 25X1 1.5 SAFETY (NEEDLE) IMPLANT
NS IRRIG 1000ML POUR BTL (IV SOLUTION) ×1 IMPLANT
PACK BASIN DAY SURGERY FS (CUSTOM PROCEDURE TRAY) ×2 IMPLANT
PAD DRESSING TELFA 3X8 NADH (GAUZE/BANDAGES/DRESSINGS) IMPLANT
PENCIL BUTTON HOLSTER BLD 10FT (ELECTRODE) ×2 IMPLANT
PIN SAFETY STERILE (MISCELLANEOUS) IMPLANT
SLEEVE SCD COMPRESS KNEE MED (MISCELLANEOUS) ×1 IMPLANT
SPONGE GAUZE 4X4 12PLY STER LF (GAUZE/BANDAGES/DRESSINGS) IMPLANT
SPONGE LAP 18X18 X RAY DECT (DISPOSABLE) ×2 IMPLANT
SUT ETHILON 2 0 FS 18 (SUTURE) ×1 IMPLANT
SUT ETHILON 4 0 PS 2 18 (SUTURE) ×2 IMPLANT
SUT MNCRL AB 4-0 PS2 18 (SUTURE) IMPLANT
SUT VIC AB 3-0 FS2 27 (SUTURE) ×1 IMPLANT
SUT VICRYL 4-0 PS2 18IN ABS (SUTURE) ×2 IMPLANT
SWAB COLLECTION DEVICE MRSA (MISCELLANEOUS) IMPLANT
SYR BULB IRRIGATION 50ML (SYRINGE) ×2 IMPLANT
SYR CONTROL 10ML LL (SYRINGE) IMPLANT
TOWEL OR 17X24 6PK STRL BLUE (TOWEL DISPOSABLE) ×4 IMPLANT
TUBE ANAEROBIC SPECIMEN COL (MISCELLANEOUS) IMPLANT
TUBE CONNECTING 20X1/4 (TUBING) ×2 IMPLANT
UNDERPAD 30X30 INCONTINENT (UNDERPADS AND DIAPERS) ×3 IMPLANT
YANKAUER SUCT BULB TIP NO VENT (SUCTIONS) ×2 IMPLANT

## 2013-08-30 NOTE — Interval H&P Note (Signed)
History and Physical Interval Note:  08/30/2013 11:34 AM  Deanna Buck  has presented today for surgery, with the diagnosis of Personal history of malignant neoplasm of breast  The various methods of treatment have been discussed with the patient and family. After consideration of risks, benefits and other options for treatment, the patient has consented to  Procedure(s): DEBRIDEMENT OF LEFT BREAST (Left) as a surgical intervention .  The patient's history has been reviewed, patient examined, no change in status, stable for surgery.  I have reviewed the patient's chart and labs.  Questions were answered to the patient's satisfaction.    Patient developed chills and nausea overnight. On exam has left lateral chest erythema developing and increased drain output Counseled she may require exchange of implant and counseled should proceed with debridement non viable tissue that may be contributing to symptoms  Deanna Buck

## 2013-08-30 NOTE — Op Note (Signed)
Operative Note   DATE OF OPERATION: 8.18.2015  LOCATION: [facility] - outpatient  SURGICAL DIVISION: Plastic Surgery  PREOPERATIVE DIAGNOSES:  1. Left breast cancer 2. Acquired absence of left breast.  POSTOPERATIVE DIAGNOSES:  same  PROCEDURE:  Revision of left reconstructed breast with excisional debridement of skin, subcutaneous tissue and exchange of tissue expander.   SURGEON: [name] MD MBA  ASSISTANT: none  ANESTHESIA:  General.   EBL: minimal  COMPLICATIONS: None immediate.   INDICATIONS FOR PROCEDURE:  The patient, Deanna Buck, is a 68 y.o. female born on Feb 24, 1945, is here for debridement of left reconstructed breast.    FINDINGS: She is 4 weeks from nipple sparing mastectomy and developed area of full thickness necrosis of mastectomy flap lateral in nipple areolar complex. New [place] style 9200 expander placed, 350 ml, Initial fill volume 75 cc. SN [id number]   DESCRIPTION OF PROCEDURE:  The patient's operative site was marked with the patient in the preoperative area. The patient was taken to the operating room. SCDs were placed and IV antibiotics were given. The patient's operative site was prepped and draped in a sterile fashion. A time out was performed and all information was confirmed to be correct.  Knife used to complete sharp excision of eschar of left mastectomy flap lateral to NAC. Area of necrosis was full thickness. Total area 3 x 5 cm in greatest dimensions. The acellular dermis underlying the area of full thickness necrosis was not incorporated and was excised with scissors. The tissue expander was removed intact. Examination of pocket revealed remainder of acellular dermis had incorporated. Cavity irrigated with solution containing Ancef, gentamicin, and bacitracin. 15 Fr drain placed and secured to skin with 2-0 nylon. I elected to place new tissue expander and this was placed in cavity. Closure completed with 3-0 vicryl in dermis and  4-0 nylon for skin closure. There was an additional scab present along lateral inframammary scar that was excised. This represented a partial thickness injury. Simple closure completed with running 4-0 nylon. Dermabond and dry dressing applied.   The patient was allowed to wake from anesthesia, extubated and taken to the recovery room in satisfactory condition.   SPECIMENS: none  DRAINS: 15 Fr drain in left reconstructed breast  [name], MD [facility]astic [facility] Re[facility] [phone]

## 2013-08-30 NOTE — Discharge Instructions (Signed)
About my Jackson-Pratt Bulb Drain ° °What is a Jackson-Pratt bulb? °A Jackson-Pratt is a soft, round device used to collect drainage. It is connected to a long, thin drainage catheter, which is held in place by one or two small stiches near your surgical incision site. When the bulb is squeezed, it forms a vacuum, forcing the drainage to empty into the bulb. ° °Emptying the Jackson-Pratt bulb- °To empty the bulb: °1. Release the plug on the top of the bulb. °2. Pour the bulb's contents into a measuring container which your nurse will provide. °3. Record the time emptied and amount of drainage. Empty the drain(s) as often as your     doctor or nurse recommends. ° °Date                  Time                    Amount (Drain 1)                 Amount (Drain 2) ° °_____________________________________________________________________ ° °_____________________________________________________________________ ° °_____________________________________________________________________ ° °_____________________________________________________________________ ° °_____________________________________________________________________ ° °_____________________________________________________________________ ° °_____________________________________________________________________ ° °_____________________________________________________________________ ° °Squeezing the Jackson-Pratt Bulb- °To squeeze the bulb: °1. Make sure the plug at the top of the bulb is open. °2. Squeeze the bulb tightly in your fist. You will hear air squeezing from the bulb. °3. Replace the plug while the bulb is squeezed. °4. Use a safety pin to attach the bulb to your clothing. This will keep the catheter from     pulling at the bulb insertion site. ° °When to call your doctor- °Call your doctor if: °· Drain site becomes red, swollen or hot. °· You have a fever greater than 101 degrees F. °· There is oozing at the drain site. °· Drain falls out (apply a guaze  bandage over the drain hole and secure it with tape). °· Drainage increases daily not related to activity patterns. (You will usually have more drainage when you are active than when you are resting.) °· Drainage has a bad odor. ° ° °Post Anesthesia Home Care Instructions ° °Activity: °Get plenty of rest for the remainder of the day. A responsible adult should stay with you for 24 hours following the procedure.  °For the next 24 hours, DO NOT: °-Drive a car °-Operate machinery °-Drink alcoholic beverages °-Take any medication unless instructed by your physician °-Make any legal decisions or sign important papers. ° °Meals: °Start with liquid foods such as gelatin or soup. Progress to regular foods as tolerated. Avoid greasy, spicy, heavy foods. If nausea and/or vomiting occur, drink only clear liquids until the nausea and/or vomiting subsides. Call your physician if vomiting continues. ° °Special Instructions/Symptoms: °Your throat may feel dry or sore from the anesthesia or the breathing tube placed in your throat during surgery. If this causes discomfort, gargle with warm salt water. The discomfort should disappear within 24 hours. ° ° ° °

## 2013-08-30 NOTE — Transfer of Care (Signed)
Immediate Anesthesia Transfer of Care Note  Patient: Deanna Buck  Procedure(s) Performed: Procedure(s): DEBRIDEMENT OF LEFT BREAST (Left)  Patient Location: PACU  Anesthesia Type:General  Level of Consciousness: awake, sedated and patient cooperative  Airway & Oxygen Therapy: Patient Spontanous Breathing and Patient connected to face mask oxygen  Post-op Assessment: Report given to PACU RN and Post -op Vital signs reviewed and stable  Post vital signs: Reviewed and stable  Complications: No apparent anesthesia complications

## 2013-08-30 NOTE — Anesthesia Preprocedure Evaluation (Signed)
Anesthesia Evaluation  Patient identified by MRN, date of birth, ID band Patient awake    Reviewed: Allergy & Precautions, H&P , NPO status , Patient's Chart, lab work & pertinent test results  Airway Mallampati: II TM Distance: >3 FB Neck ROM: Full    Dental  (+) Edentulous Upper, Edentulous Lower   Pulmonary  breath sounds clear to auscultation        Cardiovascular hypertension, Rhythm:Regular Rate:Normal     Neuro/Psych    GI/Hepatic   Endo/Other    Renal/GU      Musculoskeletal   Abdominal   Peds  Hematology   Anesthesia Other Findings   Reproductive/Obstetrics                           Anesthesia Physical Anesthesia Plan  ASA: II  Anesthesia Plan: General   Post-op Pain Management:    Induction: Intravenous  Airway Management Planned: Oral ETT  Additional Equipment:   Intra-op Plan:   Post-operative Plan:   Informed Consent: I have reviewed the patients History and Physical, chart, labs and discussed the procedure including the risks, benefits and alternatives for the proposed anesthesia with the patient or authorized representative who has indicated his/her understanding and acceptance.     Plan Discussed with: CRNA and Anesthesiologist  Anesthesia Plan Comments:         Anesthesia Quick Evaluation

## 2013-08-30 NOTE — Anesthesia Postprocedure Evaluation (Signed)
  Anesthesia Post-op Note  Patient: Deanna Buck  Procedure(s) Performed: Procedure(s): DEBRIDEMENT OF LEFT BREAST (Left)  Patient Location: PACU  Anesthesia Type: General   Level of Consciousness: awake, alert  and oriented  Airway and Oxygen Therapy: Patient Spontanous Breathing  Post-op Pain: mild  Post-op Assessment: Post-op Vital signs reviewed  Post-op Vital Signs: Reviewed  Last Vitals:  Filed Vitals:   08/30/13 1600  BP: 114/45  Pulse: 70  Temp:   Resp: 18    Complications: No apparent anesthesia complications

## 2013-08-30 NOTE — Anesthesia Procedure Notes (Addendum)
Procedure Name: Intubation Date/Time: 08/30/2013 1:50 PM Performed by: Lyndee Leo Pre-anesthesia Checklist: Patient identified, Emergency Drugs available, Suction available and Patient being monitored Patient Re-evaluated:Patient Re-evaluated prior to inductionOxygen Delivery Method: Circle System Utilized Preoxygenation: Pre-oxygenation with 100% oxygen Intubation Type: IV induction Ventilation: Mask ventilation without difficulty Laryngoscope Size: Mac and 3 Grade View: Grade II Tube type: Oral Number of attempts: 1 Airway Equipment and Method: stylet and oral airway Placement Confirmation: ETT inserted through vocal cords under direct vision,  positive ETCO2 and breath sounds checked- equal and bilateral Secured at: 20 cm Tube secured with: Tape Dental Injury: Teeth and Oropharynx as per pre-operative assessment

## 2013-08-31 ENCOUNTER — Encounter (HOSPITAL_BASED_OUTPATIENT_CLINIC_OR_DEPARTMENT_OTHER): Payer: Self-pay | Admitting: Plastic Surgery

## 2013-09-01 ENCOUNTER — Ambulatory Visit (HOSPITAL_BASED_OUTPATIENT_CLINIC_OR_DEPARTMENT_OTHER): Payer: BC Managed Care – PPO | Admitting: Oncology

## 2013-09-01 ENCOUNTER — Other Ambulatory Visit: Payer: BC Managed Care – PPO

## 2013-09-01 VITALS — BP 136/49 | HR 54 | Temp 98.0°F | Resp 20 | Ht 64.0 in | Wt 159.1 lb

## 2013-09-01 DIAGNOSIS — Z170 Estrogen receptor positive status [ER+]: Secondary | ICD-10-CM

## 2013-09-01 DIAGNOSIS — C50412 Malignant neoplasm of upper-outer quadrant of left female breast: Secondary | ICD-10-CM

## 2013-09-01 DIAGNOSIS — C50919 Malignant neoplasm of unspecified site of unspecified female breast: Secondary | ICD-10-CM

## 2013-09-01 DIAGNOSIS — C50419 Malignant neoplasm of upper-outer quadrant of unspecified female breast: Secondary | ICD-10-CM

## 2013-09-01 MED ORDER — TAMOXIFEN CITRATE 20 MG PO TABS: 20.0000 mg | ORAL_TABLET | Freq: Every day | ORAL | Status: AC

## 2013-09-01 NOTE — Addendum Note (Signed)
Addended by: Chauncey Cruel on: 09/01/2013 03:55 PM   Modules accepted: Orders

## 2013-09-01 NOTE — Progress Notes (Signed)
[facility]  [phone] [phone]     ID: Tamella Tuccillo DOB: Apr 01, 1945  MR#: [id number]  NLG#:[id number]  Patient Care Team: Rowe Clack, MD as PCP - General Daria Pastures, MD as Consulting Physician (Obstetrics and Gynecology) Jarome Matin, MD as Consulting Physician (Dermatology) Chauncey Cruel, MD as Consulting Physician (Oncology) Rolm Bookbinder M.D., Gery Pray, Arnoldo Hooker [place]  CHIEF COMPLAINT: Early stage multifocal breast cancer  CURRENT TREATMENT: Awaiting adjuvant radiation; starting tamoxifen mid-September   BREAST CANCER HISTORY: From the original intake note:  [place] had routine yearly screening mammography with tomography 05/27/2013 at the breast Center, showing a possible area of distortion in the left breast. Left diagnostic mammography and ultrasonography 06/13/2013 showed the breast density to be category C. In the upper outer quadrant of the left breast there was a mass measuring approximately 1.8 cm. It was not palpable on exam. Ultrasound showed a hypoechoic irregular mass measuring 1.3 cm correlating with the mammographic finding. In addition, a smaller, 1.0 cm mass was detected approximately 3 cm from the index lesion. The left axilla was negative.  Biopsy of both masses in the left breast 06/22/2013 showed ([id number]) morphologically similar invasive mammary carcinoma, grade 1 or 2, estrogen receptor positive at 93%, progesterone receptor positive at 91%, both with strong staining intensity, with an MIB-1 of 11%, and no HER-2 amplification, the signals ratio being 1.03 and the number per cell 1.55.  On 06/29/2013 the patient underwent bilateral breast MRI at [facility]. By MRI the larger left breast mass measured 3.3 cm. The second, smaller mass measured 1.1 cm. Combined the masses measures 3.9 cm maximally. [By mammography, the masses were 2.4 cm apart with a combined diameter of 4.9 cm.]  The  patient's subsequent history is as detailed below  INTERVAL HISTORY: Xochilth returns today for followup of her breast cancer accompanied by her husband [name]. At the last visit here we discussed her situation extensively and she was given information on standard of care treatment as well as offered participation in the S1007 study since that visit the patient has spoken to many of her friends him a done some reading, and some cracking. She has made a firm decision that she does not want chemotherapy.  REVIEW OF SYSTEMS: Josilynn had to undergo debridement of nonviable tissue from her left breast and was also having chills and fevers, requiring antibiotics and pain requiring narcotics. All this is making her somewhat nauseated. She will be seeing her plastic surgeon later today for further followup of these problems. Otherwise a detailed review of systems today was stable  PAST MEDICAL HISTORY: Past Medical History  Diagnosis Date  . ECZEMA   . PERIPHERAL EDEMA   . ANXIETY   . GERD   . HYPERTENSION   . OA (osteoarthritis)   . Abnormal mammogram 05/2013    L breast  . Full dentures   . Wears contact lenses     also wears glasses    PAST SURGICAL HISTORY: Past Surgical History  Procedure Laterality Date  . 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 Breast lumpectomy  1982    rt and lt benign  . Breast reconstruction with placement of tissue expander and flex hd (acellular hydrated dermis) Left 08/04/2013    Procedure: BREAST RECONSTRUCTION WITH PLACEMENT OF TISSUE EXPANDER AND  POSSIBLE FLEX HD (ACELLULAR HYDRATED DERMIS);  Surgeon: [name], MD;  Location:  [facility];  Service: Plastics;  Laterality: Left;  . Incision and drainage of wound Left 08/30/2013    Procedure: DEBRIDEMENT OF LEFT BREAST;  Surgeon: [name], MD;  Location: [facility];  Service: Plastics;  Laterality: Left;     FAMILY HISTORY Family History  Problem Relation Age of Onset  . Breast cancer Mother   . Dementia Mother   . Arthritis Other   . Hypertension Other     parent  . Hyperlipidemia Other     parent  . Kidney disease Other     parent   the patient's father died at the age of 16 with congestive heart failure. The patient's mother died at the age of 72. She was diagnosed with breast cancer at the age of 50. The only other breast cancer in the family was on the opposite, father's side, a first cousin diagnosed in her 15s. The patient had one brother who died from a myocardial infarction. She had no sisters.  GYNECOLOGIC HISTORY:  No LMP recorded. Patient is postmenopausal. Menarche age 68, first live birth age 42, the patient is [place] P2. She underwent menopause approximately 2002. She did not take hormone replacement. She did take birth control pills remotely for approximately 13 years, with no complications.  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 Her children from a prior marriage are Michaela Corner, who is an Optometrist, and Lavon Paganini, who is also an Optometrist. Both live in [place]. The patient's husband Foy Mungia work for [employer]. The patient has 2 grandchildren. She is not a Ambulance person.    ADVANCED DIRECTIVES: In place   HEALTH MAINTENANCE: History  Substance Use Topics  . Smoking status: Never Smoker   . Smokeless tobacco: Not on file     Comment: Married, but in stressful relationship with spouse  . Alcohol Use: Yes     Comment: rare glass wine     Colonoscopy:  PAP:  Bone density: 03/31/2011 ad lib. are showing significant osteopenia, with the lowest T score at -2.4  Lipid panel:  Allergies  Allergen Reactions  . Morphine And Related Shortness Of Breath  . Sulfonamide Derivatives Hives and Itching  . Penicillins Other (See Comments)    Dr. Sherolyn Buba told her she was allergic to PCN.    Current Outpatient Prescriptions   Medication Sig Dispense Refill  . ALPRAZolam (XANAX) 0.5 MG tablet Take 1 tablet (0.5 mg total) by mouth 3 (three) times daily as needed for anxiety.  30 tablet  1  . clindamycin (CLEOCIN) 300 MG capsule Take 1 capsule (300 mg total) by mouth 3 (three) times daily.  21 capsule  0  . esomeprazole (NEXIUM) 40 MG capsule Take 40 mg by mouth daily at 12 noon.      . hydrochlorothiazide (HYDRODIURIL) 25 MG tablet TAKE 1 TABLET BY MOUTH DAILY  90 tablet  3  . nadolol (CORGARD) 40 MG tablet TAKE ONE-HALF (1/2) TABLET DAILY  45 tablet  3  . oxyCODONE-acetaminophen (PERCOCET/ROXICET) 5-325 MG per tablet Take 1-2 tablets by mouth every 4 (four) hours as needed for moderate pain.  50 tablet  0  . oxyCODONE-acetaminophen (ROXICET) 5-325 MG per tablet Take 1-2 tablets by mouth every 4 (four) hours as needed for severe pain.  40 tablet  0  . UNABLE TO FIND 174.4 Left Mastectomy  L8015-Post-Mastectomy Garment-2  1 each  0  . valACYclovir (VALTREX) 1000 MG tablet Take 1 tablet (1,000 mg total) by  mouth daily as needed.  30 tablet  0  . vitamin B-12 (CYANOCOBALAMIN) 100 MCG tablet Take 50 mcg by mouth daily.       No current facility-administered medications for this visit.    OBJECTIVE:  Middle-aged white woman who appears stated age 68 Vitals:   09/01/13 1451  BP: 136/49  Pulse: 54  Temp: 98 F (36.7 C)  Resp: 20     Body mass index is 27.3 kg/(m^2).    ECOG FS:2 - Symptomatic, <50% confined to bed  Physical exam today was not repeated  LAB RESULTS:  CMP     Component Value Date/Time   NA 140 08/01/2013 1210   NA 143 06/29/2013 1224   K 4.2 08/01/2013 1210   K 3.4* 06/29/2013 1224   CL 103 08/01/2013 1210   CO2 27 08/01/2013 1210   CO2 28 06/29/2013 1224   GLUCOSE 95 08/01/2013 1210   GLUCOSE 113 06/29/2013 1224   BUN 17 08/01/2013 1210   BUN 14.3 06/29/2013 1224   CREATININE 0.69 08/01/2013 1210   CREATININE 0.8 06/29/2013 1224   CALCIUM 10.2 08/01/2013 1210   CALCIUM 10.6* 06/29/2013 1224    PROT 7.5 06/29/2013 1224   PROT 7.2 06/23/2013 0936   ALBUMIN 4.0 06/29/2013 1224   ALBUMIN 4.0 06/23/2013 0936   AST 17 06/29/2013 1224   AST 17 06/23/2013 0936   ALT 10 06/29/2013 1224   ALT 14 06/23/2013 0936   ALKPHOS 101 06/29/2013 1224   ALKPHOS 95 06/23/2013 0936   BILITOT 1.34* 06/29/2013 1224   BILITOT 1.2 06/23/2013 0936   GFRNONAA 88* 08/01/2013 1210   GFRAA >90 08/01/2013 1210    I No results found for this basename: SPEP,  UPEP,   kappa and lambda light chains    Lab Results  Component Value Date   WBC 8.5 06/29/2013   NEUTROABS 5.8 06/29/2013   HGB 13.6 08/30/2013   HCT 38.8 06/29/2013   MCV 86.6 06/29/2013   PLT 252 06/29/2013      Chemistry      Component Value Date/Time   NA 140 08/01/2013 1210   NA 143 06/29/2013 1224   K 4.2 08/01/2013 1210   K 3.4* 06/29/2013 1224   CL 103 08/01/2013 1210   CO2 27 08/01/2013 1210   CO2 28 06/29/2013 1224   BUN 17 08/01/2013 1210   BUN 14.3 06/29/2013 1224   CREATININE 0.69 08/01/2013 1210   CREATININE 0.8 06/29/2013 1224      Component Value Date/Time   CALCIUM 10.2 08/01/2013 1210   CALCIUM 10.6* 06/29/2013 1224   ALKPHOS 101 06/29/2013 1224   ALKPHOS 95 06/23/2013 0936   AST 17 06/29/2013 1224   AST 17 06/23/2013 0936   ALT 10 06/29/2013 1224   ALT 14 06/23/2013 0936   BILITOT 1.34* 06/29/2013 1224   BILITOT 1.2 06/23/2013 0936       No results found for this basename: LABCA2    No components found with this basename: LABCA125    No results found for this basename: INR,  in the last 168 hours  Urinalysis    Component Value Date/Time   COLORURINE YELLOW 06/23/2013 [place] 06/23/2013 0936   LABSPEC 1.020 06/23/2013 0936   PHURINE 7.0 06/23/2013 0936   GLUCOSEU NEGATIVE 06/23/2013 0936   GLUCOSEU NEGATIVE 12/26/2008 1615   HGBUR NEGATIVE 06/23/2013 0936   HGBUR negative 09/11/2009 [place] 06/23/2013 0936   BILIRUBINUR neg 02/17/2011 1625  KETONESUR NEGATIVE 06/23/2013 0936   PROTEINUR neg  02/17/2011 1625   PROTEINUR NEGATIVE 12/26/2008 1615   UROBILINOGEN 0.2 06/23/2013 0936   UROBILINOGEN 0.2 02/17/2011 1625   NITRITE NEGATIVE 06/23/2013 0936   NITRITE pos 02/17/2011 1625   LEUKOCYTESUR NEGATIVE 06/23/2013 0936    STUDIES: Nm Sentinel Node Inj-no Rpt (breast)  08/04/2013   CLINICAL DATA: left axillary snbx   Sulfur colloid was injected intradermally by the nuclear medicine  technologist for breast cancer sentinel node localization.    ASSESSMENT: 67 y.o. [place] woman status post biopsy of 2 separate left breast masses 06/22/2013 for a clinical mT1c N0, stage IA carcinoma, grade 1 or 2, estrogen receptor 93% positive, progesterone receptor 91% positive, with an MIB-1 of 11%, and no HER-2 amplification  (1) status post left mastectomy and sentinel lymph node sampling 08/04/2013 for a pT2 pN1, stage IIB invasive ductal carcinoma, grade 2, with negative margins, and repeat HER-2 again negative  (2) Oncotype score of 13 predicts a risk of recurrence or mortality within 5 years of 10% with tamoxifen alone, and 11% with tamoxifen plus chemotherapy; the patient was offered chemotherapy vs. participation in S007, but decided against both  (3) tamoxifen to start 09/23/2013  (4) radiation to start at the discretion of Drs Sondra Come and [place]: Torie has a good understanding of her overall situation, which we reviewed today. She understands the standard of care in her case his chemotherapy. Nevertheless she has decided against it. She has consulted with many friends, prayed about it, and has reached a firm decision with which she is comfortable.  Accordingly the plan will be to proceed with radiation as soon as she is sufficiently advanced with her reconstruction to allow for it. Because there may be some delay, I am starting her on tamoxifen as of mid-September. I am comfortable continuing the tamoxifen right through the radiation, since we do not have data clearly showing any  negative interaction between those 2 forms of treatment.  Today we discussed the possible toxicities, side effects and complications of tamoxifen in detail. The patient understands this will be her only form of systemic therapy, and therefore we will make every effort to make sure she can complete a minimum of 5 years on anti-estrogens.  I recommended participation in the "finding your new normal" group as soon she has completed her local treatment.  Taneisha has a good understanding of this plan. She agrees with it. She knows to call for any problems that may develop before the next visit here.   Chauncey Cruel, MD   09/01/2013 3:01 PM

## 2013-09-06 ENCOUNTER — Ambulatory Visit: Payer: BC Managed Care – PPO | Attending: General Surgery | Admitting: Physical Therapy

## 2013-09-06 DIAGNOSIS — C50919 Malignant neoplasm of unspecified site of unspecified female breast: Secondary | ICD-10-CM | POA: Diagnosis not present

## 2013-09-06 DIAGNOSIS — M249 Joint derangement, unspecified: Secondary | ICD-10-CM | POA: Insufficient documentation

## 2013-09-07 ENCOUNTER — Telehealth: Payer: Self-pay | Admitting: Oncology

## 2013-09-07 NOTE — Telephone Encounter (Signed)
, °

## 2013-09-12 ENCOUNTER — Other Ambulatory Visit: Payer: Self-pay | Admitting: Oncology

## 2013-09-12 DIAGNOSIS — C50919 Malignant neoplasm of unspecified site of unspecified female breast: Secondary | ICD-10-CM

## 2013-09-13 ENCOUNTER — Telehealth: Payer: Self-pay | Admitting: Oncology

## 2013-09-13 ENCOUNTER — Ambulatory Visit: Payer: BC Managed Care – PPO | Attending: General Surgery | Admitting: Physical Therapy

## 2013-09-13 DIAGNOSIS — M249 Joint derangement, unspecified: Secondary | ICD-10-CM | POA: Insufficient documentation

## 2013-09-13 DIAGNOSIS — C50919 Malignant neoplasm of unspecified site of unspecified female breast: Secondary | ICD-10-CM | POA: Insufficient documentation

## 2013-09-13 NOTE — Telephone Encounter (Signed)
referral for dr Sondra Come entered 8/31. pt already on schedule for 9/16 - scheduled by radonc based on referral entered yesterday.

## 2013-09-15 ENCOUNTER — Ambulatory Visit: Payer: BC Managed Care – PPO | Admitting: Physical Therapy

## 2013-09-20 ENCOUNTER — Ambulatory Visit: Payer: BC Managed Care – PPO | Admitting: Physical Therapy

## 2013-09-20 DIAGNOSIS — C50919 Malignant neoplasm of unspecified site of unspecified female breast: Secondary | ICD-10-CM | POA: Diagnosis not present

## 2013-09-22 ENCOUNTER — Ambulatory Visit: Payer: BC Managed Care – PPO | Admitting: Physical Therapy

## 2013-09-22 DIAGNOSIS — C50919 Malignant neoplasm of unspecified site of unspecified female breast: Secondary | ICD-10-CM | POA: Diagnosis not present

## 2013-09-23 ENCOUNTER — Encounter: Payer: Self-pay | Admitting: Radiation Oncology

## 2013-09-23 ENCOUNTER — Encounter (HOSPITAL_COMMUNITY): Payer: Self-pay

## 2013-09-23 NOTE — Progress Notes (Signed)
Location of Breast Cancer: left breast  Histology per Pathology Report:  06/22/13 Diagnosis 1. Breast, left, needle core biopsy, mass, 12 o'clock - INVASIVE MAMMARY CARCINOMA - MAMMARY CARCINOMA IN SITU. - SEE COMMENT. 2. Breast, left, needle core biopsy, mass, 1 o'clock - INVASIVE MAMMARY CARCINOMA.  08/04/13 Diagnosis 1. Breast, simple mastectomy, Left nipple - INVASIVE GRADE II DUCTAL CARCINOMA SPANNING 3.6 CM IN GREATEST DIMENSION. - INTERMEDIATE GRADE DUCTAL CARCINOMA IN SITU IS PRESENT. - ASSOCIATED CALCIFICATIONS. - LYMPH/VASCULAR INVASION IS IDENTIFIED. - MARGINS ARE NEGATIVE. - SEE ONCOLOGY TEMPLATE. 2. Lymph node, sentinel, biopsy, Left axillary #1 - ONE LYMPH NODE POSITIVE FOR METASTATIC DUCTAL CARCINOMA (1/1). 3. Lymph node, sentinel, biopsy, Left axillary #2 - ONE LYMPH NODE WITH NO TUMOR SEEN (0/1) 4. Lymph node, sentinel, biopsy, Left axillary #3 - ONE LYMPH NODE POSITIVE FOR METASTATIC DUCTAL CARCINOMA (1/1). - EXTRACAPSULAR EXTENSION IS IDENTIFIED. 5. Lymph node, sentinel, biopsy, Left axillary #4 - ONE BENIGN LYMPH NODE WITH NO TUMOR SEEN (0/1). 6. Breast, excision, Left retroareolar tissue - BREAST PARENCHYMA WITH FIBROCYSTIC CHANGES AND USUAL DUCTAL HYPERPLASIA. - NO ATYPIA OR MALIGNANCY IDENTIFIED. 7. Breast, excision, Left retronipple tissue - BENIGN BREAST PARENCHYMA WITH FIBROSIS AND SCATTERED CHRONIC INFLAMMATION. - FAT NECROSIS PRESENT. - FIBROCYSTIC CHANGES IDENTIFIED. - NO ATYPIA, HYPERPLASIA, OR MALIGNANCY IDENTIFIED.  Receptor Status: ER(93%), PR (91%, Her2-neu (negative)  Did patient present with symptoms (if so, please note symptoms) or was this found on screening mammography?: screening mammogram  Past/Anticipated interventions by surgeon, if any: 08/04/13 - Procedure: BREAST RECONSTRUCTION WITH PLACEMENT OF TISSUE EXPANDER AND  POSSIBLE FLEX HD (ACELLULAR HYDRATED DERMIS);  Surgeon: [name], MD;  Location: [facility];   Service: Plastics;  Laterality: Left; 08/30/13 - Procedure: DEBRIDEMENT OF LEFT BREAST;  Surgeon: [name], MD;  Location: [facility];  Service: Plastics;  Laterality: Left;  Past/Anticipated interventions by medical oncology, if any: tamoxifen to start 09/21/2013 and continue during radiation.  OBGYN history: GYNECOLOGIC HISTORY:  No LMP recorded. Patient is postmenopausal.  Menarche age 66, first live birth age 65, the patient is [place] P2. She underwent menopause approximately 2002. She did not take hormone replacement. She did take birth control pills remotely for approximately 13 years, with no complications.  Lymphedema issues, if any:  No.  Patient has a compression sleve for her left arm as needed.  Pain issues, if any:  no  SAFETY ISSUES:  Prior radiation? no  Pacemaker/ICD? no  Possible current pregnancy?no  Is the patient on methotrexate? no  Current Complaints / other details:  Patient is here with her husband and daughter.

## 2013-09-27 ENCOUNTER — Ambulatory Visit: Payer: BC Managed Care – PPO | Admitting: Physical Therapy

## 2013-09-27 DIAGNOSIS — C50919 Malignant neoplasm of unspecified site of unspecified female breast: Secondary | ICD-10-CM | POA: Diagnosis not present

## 2013-09-28 ENCOUNTER — Ambulatory Visit
Admission: RE | Admit: 2013-09-28 | Discharge: 2013-09-28 | Disposition: A | Discharge status: Home / Self Care | Payer: BC Managed Care – PPO | Source: Ambulatory Visit | Attending: Radiation Oncology | Admitting: Radiation Oncology

## 2013-09-28 ENCOUNTER — Encounter: Payer: Self-pay | Admitting: Radiation Oncology

## 2013-09-28 VITALS — BP 149/63 | HR 65 | Temp 98.4°F | Ht 64.0 in | Wt 157.0 lb

## 2013-09-28 DIAGNOSIS — Z9010 Acquired absence of unspecified breast and nipple: Secondary | ICD-10-CM | POA: Insufficient documentation

## 2013-09-28 DIAGNOSIS — Z170 Estrogen receptor positive status [ER+]: Secondary | ICD-10-CM | POA: Insufficient documentation

## 2013-09-28 DIAGNOSIS — Z510 Encounter for antineoplastic radiation therapy: Secondary | ICD-10-CM | POA: Insufficient documentation

## 2013-09-28 DIAGNOSIS — C50412 Malignant neoplasm of upper-outer quadrant of left female breast: Secondary | ICD-10-CM

## 2013-09-28 DIAGNOSIS — Z79899 Other long term (current) drug therapy: Secondary | ICD-10-CM | POA: Diagnosis not present

## 2013-09-28 DIAGNOSIS — C773 Secondary and unspecified malignant neoplasm of axilla and upper limb lymph nodes: Secondary | ICD-10-CM | POA: Diagnosis not present

## 2013-09-28 DIAGNOSIS — C50419 Malignant neoplasm of upper-outer quadrant of unspecified female breast: Secondary | ICD-10-CM | POA: Insufficient documentation

## 2013-09-28 HISTORY — DX: Malignant neoplasm of unspecified site of unspecified female breast: C50.919

## 2013-09-28 NOTE — Progress Notes (Signed)
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 The patient was initially seen in the multidisciplinary breast clinic on 06/29/2013. At that time the patient was felt to have multifocal invasive mammary carcinoma of the left breast. Since that initial evaluation the patient proceeded to undergo a nipple sparing left mastectomy and sentinel node procedure on 08/04/2013 by Dr. Matthew Wakefield. This was followed by breast reconstruction with tissue expander 2 and acellular dermis for reconstruction of breast by Dr. BrindaThimmappa.  Postoperatively the patient developed an area of full thickness necrosis of the mastectomy flap lateral to the nipple areolar complex. The patient was taken to the operating room on August 18 and underwent revision of the left reconstructed breast with excisional debridement of skin, subcutaneous tissue and exchange of tissue expander. The patient has been doing well since her second surgery. She is now seen in radiation oncology to be considered for postmastectomy irradiation.  The patient's mastectomy specimen showed an invasive grade 2 ductal carcinoma spanning over 3.6 cm. There was associated intermediate grade ductal carcinoma in situ as well as lymphovascular space invasion. The surgical margins were clear on the mastectomy specimen with the closest margin being 1.5 cm. 4 lymph nodes were removed from the axilla 2 of which showed metastatic disease. In addition one of the lymph nodes showed extracapsular extension. Comments on the  pathology report showed 2 tumor nodules identified grossly but invasive ductal carcinoma present between the lesions so this was felt to be one tumor mass.  She denies any pain within the left chest wall area or axillary region or any problems with axillary swelling. She is undergoing physical therapy at this time and does have a lymphedema sleeve already fitted.                    ALLERGIES:  is allergic to morphine and related; sulfonamide derivatives; banana; and penicillins.  Meds: Current Outpatient Prescriptions  Medication Sig Dispense Refill  . hydrochlorothiazide (HYDRODIURIL) 25 MG tablet TAKE 1 TABLET BY MOUTH DAILY  90 tablet  3  . nadolol (CORGARD) 40 MG tablet TAKE ONE-HALF (1/2) TABLET DAILY  45 tablet  3  . tamoxifen (NOLVADEX) 20 MG tablet Take 1 tablet (20 mg total) by mouth daily.  90 tablet  12  . UNABLE TO FIND 174.4 Left Mastectomy  [id number]-Post-Mastectomy Garment-2  1 each  0  . vitamin B-12 (CYANOCOBALAMIN) 100 MCG tablet Take 50 mcg by mouth daily.      . ALPRAZolam (XANAX) 0.5 MG tablet Take 1 tablet (0.5 mg total) by mouth 3 (three) times daily as needed for anxiety.  30 tablet  1  . clindamycin (CLEOCIN) 300 MG capsule Take 1 capsule (300 mg total) by mouth 3 (three) times daily.  21 capsule  0  . esomeprazole (NEXIUM) 40 MG capsule Take 40 mg by mouth daily at 12 noon.      . oxyCODONE-acetaminophen (PERCOCET/ROXICET) 5-325 MG per tablet Take 1-2 tablets by mouth every 4 (four) hours as needed for moderate pain.  50 tablet  0  .   oxyCODONE-acetaminophen (ROXICET) 5-325 MG per tablet Take 1-2 tablets by mouth every 4 (four) hours as needed for severe pain.  40 tablet  0  . valACYclovir (VALTREX) 1000 MG tablet Take 1 tablet (1,000 mg total) by mouth daily as needed.  30 tablet  0   No current facility-administered medications for this encounter.    Physical Findings: The patient is in no acute distress. Patient is alert and oriented.  She is accompanied by her  husband and daughter on evaluation today  height is 5' 4" (1.626 m) and weight is 157 lb (71.215 kg). Her oral temperature is 98.4 F (36.9 C). Her blood pressure is 149/63 and her pulse is 65. .  No palpable supraclavicular or axillary adenopathy. The lungs are clear to auscultation. The heart has a regular rhythm and rate. Examination of the left chest area reveals reconstruction with  nipple and tissue expander in  place. No obvious signs of infection or necrosis at this time.  Lab Findings: Lab Results  Component Value Date   WBC 8.5 06/29/2013   HGB 13.6 08/30/2013   HCT 38.8 06/29/2013   MCV 86.6 06/29/2013   PLT 252 06/29/2013      Radiographic Findings: No results found.  Impression:  Stage II-B pT2, pN1 invasive ductal carcinoma. The patient was found to have two out of four lymph nodes showing metastasis within the senitel node procedure. One of these lymph nodes did show extracapsular extension.  options for management would be a completion axillary dissection. The patient elected not to proceed with this treatment approach. I do feel she would be a good candidate for postmastectomy radiation therapy to reduce the chances for local regional recurrence. I discussed the treatment course side effects and potential toxicities of radiation therapy in this situation with the patient and her family. She appears to understand and wishes to proceed with planned course of treatment. She does understand that she is at increased risk for additional complications related to her reconstructed breast particularly since she already developed a complication prior to proceeding with radiation therapy. The patient is scheduled for placement of additional fluid within the tissue expander in the next few days. I will discuss with plastic surgery as to when the patient would be ready to proceed with her radiation therapy.       she elected not to proceed with recommended adjuvant chemotherapy. She has started on  tamoxifen and is tolerating this well thus far.  Plan:  Simulation and planning and treatment pending discussion with Dr. Brinda Thimmappa.    ____________________________________ KINARD,JAMES D, MD     

## 2013-09-29 ENCOUNTER — Ambulatory Visit: Payer: BC Managed Care – PPO | Admitting: Physical Therapy

## 2013-09-29 DIAGNOSIS — C50919 Malignant neoplasm of unspecified site of unspecified female breast: Secondary | ICD-10-CM | POA: Diagnosis not present

## 2013-10-04 ENCOUNTER — Ambulatory Visit: Payer: BC Managed Care – PPO | Admitting: Physical Therapy

## 2013-10-04 ENCOUNTER — Ambulatory Visit (INDEPENDENT_AMBULATORY_CARE_PROVIDER_SITE_OTHER): Payer: BC Managed Care – PPO

## 2013-10-04 DIAGNOSIS — Z23 Encounter for immunization: Secondary | ICD-10-CM

## 2013-10-04 DIAGNOSIS — C50919 Malignant neoplasm of unspecified site of unspecified female breast: Secondary | ICD-10-CM | POA: Diagnosis not present

## 2013-10-05 ENCOUNTER — Telehealth: Payer: Self-pay | Admitting: Oncology

## 2013-10-05 NOTE — Telephone Encounter (Signed)
Deanna Buck left a message asking about whether a simulation appointment has been set up yet and when radiation will start.

## 2013-10-05 NOTE — Telephone Encounter (Signed)
Called [name] back per Dr. Sondra Come and left a message that she can have simulation next week.  Advised her that she will be called with an appointment latter this week.

## 2013-10-06 ENCOUNTER — Ambulatory Visit: Payer: BC Managed Care – PPO | Admitting: Physical Therapy

## 2013-10-11 ENCOUNTER — Ambulatory Visit
Admission: RE | Admit: 2013-10-11 | Payer: BC Managed Care – PPO | Source: Ambulatory Visit | Admitting: Radiation Oncology

## 2013-10-13 ENCOUNTER — Ambulatory Visit
Admission: RE | Admit: 2013-10-13 | Discharge: 2013-10-13 | Disposition: A | Discharge status: Home / Self Care | Payer: BC Managed Care – PPO | Source: Ambulatory Visit | Attending: Radiation Oncology | Admitting: Radiation Oncology

## 2013-10-13 DIAGNOSIS — Z510 Encounter for antineoplastic radiation therapy: Secondary | ICD-10-CM | POA: Diagnosis present

## 2013-10-13 DIAGNOSIS — C50412 Malignant neoplasm of upper-outer quadrant of left female breast: Secondary | ICD-10-CM | POA: Diagnosis not present

## 2013-10-15 NOTE — Progress Notes (Signed)
  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  Identity was confirmed.  All relevant records and images related to the planned course of therapy were reviewed.  The patient freely provided informed written consent to proceed with treatment after reviewing the details related to the planned course of therapy. The consent form was witnessed and verified by the simulation staff.  Then, the patient was set-up in a stable reproducible  supine position for radiation therapy.  CT images were obtained.  Surface markings were placed.  The CT images were loaded into the planning software.  Then the target and avoidance structures were contoured.  Treatment planning then occurred.  The radiation prescription was entered and confirmed.  Then, I designed and supervised the construction of a total of 5 medically necessary complex treatment devices.  I have requested : 3D Simulation  I have requested a DVH of the following structures: heart, lungs, implant device, eosphagus.  I have ordered:dose calc.  PLAN:  The patient will receive 45 Gy in 25 fractions using a 4 field set up encompassing the left chest wall, axillary and supraclavicular region. Consideration for a boost after this initial treatment.  ________________________________  -----------------------------------  Blair Promise, PhD, MD

## 2013-10-18 ENCOUNTER — Ambulatory Visit: Payer: BC Managed Care – PPO

## 2013-10-19 ENCOUNTER — Ambulatory Visit: Payer: BC Managed Care – PPO

## 2013-10-19 DIAGNOSIS — Z510 Encounter for antineoplastic radiation therapy: Secondary | ICD-10-CM | POA: Diagnosis not present

## 2013-10-20 ENCOUNTER — Ambulatory Visit: Payer: BC Managed Care – PPO

## 2013-10-20 ENCOUNTER — Ambulatory Visit
Admission: RE | Admit: 2013-10-20 | Discharge: 2013-10-20 | Disposition: A | Discharge status: Home / Self Care | Payer: BC Managed Care – PPO | Source: Ambulatory Visit | Attending: Radiation Oncology | Admitting: Radiation Oncology

## 2013-10-20 ENCOUNTER — Other Ambulatory Visit: Payer: Self-pay | Admitting: Radiation Oncology

## 2013-10-20 DIAGNOSIS — Z510 Encounter for antineoplastic radiation therapy: Secondary | ICD-10-CM | POA: Diagnosis not present

## 2013-10-20 DIAGNOSIS — C50412 Malignant neoplasm of upper-outer quadrant of left female breast: Secondary | ICD-10-CM

## 2013-10-20 NOTE — Progress Notes (Signed)
  Radiation Oncology         [phone] ________________________________  Name: Deanna Buck MRN: [id number]  Date: 10/20/2013  DOB: 08/15/1945  Simulation Verification Note    ICD-9-CM ICD-10-CM  1. Breast cancer of upper-outer quadrant of left female breast 174.4 C50.412    Status: outpatient  NARRATIVE: The patient was brought to the treatment unit and placed in the planned treatment position. The clinical setup was verified. Then port films were obtained and uploaded to the radiation oncology medical record software.  The treatment beams were carefully compared against the planned radiation fields. The position location and shape of the radiation fields was reviewed. They targeted volume of tissue appears to be appropriately covered by the radiation beams. Organs at risk appear to be excluded as planned.  Based on my personal review, I approved the simulation verification. The patient's treatment will proceed as planned.  -----------------------------------  Blair Promise, PhD, MD

## 2013-10-21 ENCOUNTER — Ambulatory Visit: Payer: BC Managed Care – PPO

## 2013-10-21 DIAGNOSIS — Z510 Encounter for antineoplastic radiation therapy: Secondary | ICD-10-CM | POA: Diagnosis not present

## 2013-10-24 ENCOUNTER — Ambulatory Visit
Admission: RE | Admit: 2013-10-24 | Discharge: 2013-10-24 | Disposition: A | Discharge status: Home / Self Care | Payer: BC Managed Care – PPO | Source: Ambulatory Visit | Attending: Radiation Oncology | Admitting: Radiation Oncology

## 2013-10-24 ENCOUNTER — Ambulatory Visit: Payer: BC Managed Care – PPO

## 2013-10-24 DIAGNOSIS — Z510 Encounter for antineoplastic radiation therapy: Secondary | ICD-10-CM | POA: Diagnosis not present

## 2013-10-25 ENCOUNTER — Ambulatory Visit
Admission: RE | Admit: 2013-10-25 | Discharge: 2013-10-25 | Disposition: A | Discharge status: Home / Self Care | Payer: BC Managed Care – PPO | Source: Ambulatory Visit | Attending: Radiation Oncology | Admitting: Radiation Oncology

## 2013-10-25 ENCOUNTER — Encounter: Payer: Self-pay | Admitting: Radiation Oncology

## 2013-10-25 ENCOUNTER — Ambulatory Visit: Payer: BC Managed Care – PPO

## 2013-10-25 VITALS — BP 118/59 | HR 61 | Temp 98.6°F | Resp 16 | Ht 64.0 in | Wt 157.3 lb

## 2013-10-25 DIAGNOSIS — Z510 Encounter for antineoplastic radiation therapy: Secondary | ICD-10-CM | POA: Insufficient documentation

## 2013-10-25 DIAGNOSIS — C50412 Malignant neoplasm of upper-outer quadrant of left female breast: Secondary | ICD-10-CM | POA: Insufficient documentation

## 2013-10-25 MED ORDER — RADIAPLEXRX EX GEL
Freq: Once | CUTANEOUS | Status: AC
  Administered 2013-10-25: 14:00:00 via TOPICAL

## 2013-10-25 MED ORDER — ALRA NON-METALLIC DEODORANT (RAD-ONC)
1.0000 "application " | Freq: Once | TOPICAL | Status: AC
  Administered 2013-10-25: 1 via TOPICAL

## 2013-10-25 NOTE — Progress Notes (Signed)
  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 . . . . . . . The patient presents for routine under treatment assessment.                                   The patient is without complaint.                                 Set-up films were reviewed.                                 The chart was checked. Physical Findings. . .  height is 5\' 4"  (1.626 m) and weight is 157 lb 4.8 oz (71.351 kg). Her oral temperature is 98.6 F (37 C). Her blood pressure is 118/59 and her pulse is 61. Her respiration is 16. . Weight essentially stable.  No significant changes.  No signs of infection in the left chest or skin breakdown Impression . . . . . . . The patient is tolerating radiation. Plan . . . . . . . . . . . . Continue treatment as planned.  ________________________________   Blair Promise, PhD, MD

## 2013-10-25 NOTE — Progress Notes (Addendum)
Deanna Buck has had 2 fractions to her left chest.  She is taking tamoxifen and reports having a few hot flashes.  She reports having fatigue.  The skin along the scar on her left breast is red.  She is wondering if she still needs to sleep in a bra.    She was given the Radiation Therapy and You book and discussed potential side effects/management of skin changes and fatigue.  She was given the skin care handout and Alra Deoderant.  She was given radiaplex gel and was instructed to apply it to her left chest twice a day after treatment and at bedtime.

## 2013-10-26 ENCOUNTER — Ambulatory Visit
Admission: RE | Admit: 2013-10-26 | Discharge: 2013-10-26 | Disposition: A | Discharge status: Home / Self Care | Payer: BC Managed Care – PPO | Source: Ambulatory Visit | Attending: Radiation Oncology | Admitting: Radiation Oncology

## 2013-10-26 ENCOUNTER — Ambulatory Visit: Payer: BC Managed Care – PPO

## 2013-10-26 DIAGNOSIS — Z510 Encounter for antineoplastic radiation therapy: Secondary | ICD-10-CM | POA: Diagnosis not present

## 2013-10-27 ENCOUNTER — Ambulatory Visit: Payer: BC Managed Care – PPO

## 2013-10-27 ENCOUNTER — Ambulatory Visit
Admission: RE | Admit: 2013-10-27 | Discharge: 2013-10-27 | Disposition: A | Discharge status: Home / Self Care | Payer: BC Managed Care – PPO | Source: Ambulatory Visit | Attending: Radiation Oncology | Admitting: Radiation Oncology

## 2013-10-27 DIAGNOSIS — Z510 Encounter for antineoplastic radiation therapy: Secondary | ICD-10-CM | POA: Diagnosis not present

## 2013-10-28 ENCOUNTER — Ambulatory Visit: Payer: BC Managed Care – PPO

## 2013-10-28 ENCOUNTER — Ambulatory Visit
Admission: RE | Admit: 2013-10-28 | Discharge: 2013-10-28 | Disposition: A | Discharge status: Home / Self Care | Payer: BC Managed Care – PPO | Source: Ambulatory Visit | Attending: Radiation Oncology | Admitting: Radiation Oncology

## 2013-10-28 DIAGNOSIS — Z510 Encounter for antineoplastic radiation therapy: Secondary | ICD-10-CM | POA: Diagnosis not present

## 2013-10-31 ENCOUNTER — Ambulatory Visit: Payer: BC Managed Care – PPO

## 2013-10-31 ENCOUNTER — Ambulatory Visit
Admission: RE | Admit: 2013-10-31 | Discharge: 2013-10-31 | Disposition: A | Discharge status: Home / Self Care | Payer: BC Managed Care – PPO | Source: Ambulatory Visit | Attending: Radiation Oncology | Admitting: Radiation Oncology

## 2013-10-31 DIAGNOSIS — Z510 Encounter for antineoplastic radiation therapy: Secondary | ICD-10-CM | POA: Diagnosis not present

## 2013-11-01 ENCOUNTER — Ambulatory Visit: Payer: BC Managed Care – PPO

## 2013-11-01 ENCOUNTER — Ambulatory Visit
Admission: RE | Admit: 2013-11-01 | Discharge: 2013-11-01 | Disposition: A | Discharge status: Home / Self Care | Payer: BC Managed Care – PPO | Source: Ambulatory Visit | Attending: Radiation Oncology | Admitting: Radiation Oncology

## 2013-11-01 ENCOUNTER — Encounter: Payer: Self-pay | Admitting: Radiation Oncology

## 2013-11-01 VITALS — BP 121/61 | HR 58 | Temp 97.8°F | Resp 16 | Ht 64.0 in | Wt 156.8 lb

## 2013-11-01 DIAGNOSIS — Z510 Encounter for antineoplastic radiation therapy: Secondary | ICD-10-CM | POA: Diagnosis not present

## 2013-11-01 DIAGNOSIS — C50412 Malignant neoplasm of upper-outer quadrant of left female breast: Secondary | ICD-10-CM

## 2013-11-01 NOTE — Progress Notes (Signed)
Deanna Buck has completed 7 treatment to her left chest wall and axilla.  She denies pain.  She reports having to take naps every day from 3-4.  She reports feeling tightness in her left chest at night.  The skin on her left chest is pink.  She is using radiaplex gel.

## 2013-11-01 NOTE — Progress Notes (Signed)
  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 . . . . . . . The patient presents for routine under treatment assessment.                                   The patient is without complaint except for a feeling of tightness along the left chest/reconstructed area.  She denies any itching or fatigue                                 Set-up films were reviewed.                                 The chart was checked. Physical Findings. . .  height is 5\' 4"  (1.626 m) and weight is 156 lb 12.8 oz (71.124 kg). Her oral temperature is 97.8 F (36.6 C). Her blood pressure is 121/61 and her pulse is 58. Her respiration is 16. .  The lungs are clear. The heart has regular rhythm and rate. The left chest area shows mild erythema without any skin breakdown Impression . . . . . . . The patient is tolerating radiation. Plan . . . . . . . . . . . . Continue treatment as planned.  ________________________________   Blair Promise, PhD, MD

## 2013-11-02 ENCOUNTER — Ambulatory Visit
Admission: RE | Admit: 2013-11-02 | Discharge: 2013-11-02 | Disposition: A | Discharge status: Home / Self Care | Payer: BC Managed Care – PPO | Source: Ambulatory Visit | Attending: Radiation Oncology | Admitting: Radiation Oncology

## 2013-11-02 ENCOUNTER — Ambulatory Visit: Payer: BC Managed Care – PPO

## 2013-11-02 DIAGNOSIS — Z510 Encounter for antineoplastic radiation therapy: Secondary | ICD-10-CM | POA: Diagnosis not present

## 2013-11-03 ENCOUNTER — Ambulatory Visit: Payer: BC Managed Care – PPO

## 2013-11-03 ENCOUNTER — Ambulatory Visit
Admission: RE | Admit: 2013-11-03 | Discharge: 2013-11-03 | Disposition: A | Discharge status: Home / Self Care | Payer: BC Managed Care – PPO | Source: Ambulatory Visit | Attending: Radiation Oncology | Admitting: Radiation Oncology

## 2013-11-03 DIAGNOSIS — Z510 Encounter for antineoplastic radiation therapy: Secondary | ICD-10-CM | POA: Diagnosis not present

## 2013-11-04 ENCOUNTER — Ambulatory Visit: Payer: BC Managed Care – PPO

## 2013-11-04 ENCOUNTER — Ambulatory Visit
Admission: RE | Admit: 2013-11-04 | Discharge: 2013-11-04 | Disposition: A | Discharge status: Home / Self Care | Payer: BC Managed Care – PPO | Source: Ambulatory Visit | Attending: Radiation Oncology | Admitting: Radiation Oncology

## 2013-11-04 DIAGNOSIS — Z510 Encounter for antineoplastic radiation therapy: Secondary | ICD-10-CM | POA: Diagnosis not present

## 2013-11-07 ENCOUNTER — Ambulatory Visit: Payer: BC Managed Care – PPO

## 2013-11-07 ENCOUNTER — Ambulatory Visit
Admission: RE | Admit: 2013-11-07 | Discharge: 2013-11-07 | Disposition: A | Discharge status: Home / Self Care | Payer: BC Managed Care – PPO | Source: Ambulatory Visit | Attending: Radiation Oncology | Admitting: Radiation Oncology

## 2013-11-07 DIAGNOSIS — Z510 Encounter for antineoplastic radiation therapy: Secondary | ICD-10-CM | POA: Diagnosis not present

## 2013-11-08 ENCOUNTER — Ambulatory Visit
Admission: RE | Admit: 2013-11-08 | Discharge: 2013-11-08 | Disposition: A | Discharge status: Home / Self Care | Payer: BC Managed Care – PPO | Source: Ambulatory Visit | Attending: Radiation Oncology | Admitting: Radiation Oncology

## 2013-11-08 ENCOUNTER — Ambulatory Visit: Payer: BC Managed Care – PPO

## 2013-11-08 ENCOUNTER — Encounter: Payer: Self-pay | Admitting: Radiation Oncology

## 2013-11-08 ENCOUNTER — Other Ambulatory Visit: Payer: Self-pay | Admitting: *Deleted

## 2013-11-08 VITALS — BP 131/53 | HR 57 | Temp 98.5°F | Resp 16 | Ht 64.0 in | Wt 157.8 lb

## 2013-11-08 DIAGNOSIS — Z510 Encounter for antineoplastic radiation therapy: Secondary | ICD-10-CM | POA: Diagnosis not present

## 2013-11-08 DIAGNOSIS — C50412 Malignant neoplasm of upper-outer quadrant of left female breast: Secondary | ICD-10-CM

## 2013-11-08 NOTE — Progress Notes (Signed)
Deanna Buck has completed 12 fractiosn to her left chest wall.  She denies pain.  She continues to take tamoxifen.  She reports fatigue in the afternoon.  The skin on her left breast is red with hyperpigmentation.  She reports that it occasionally itches underneath her left breast.  Advised her that she can use hydrocortisone cream as needed.  She is using radiaplex 2-3 times per day.

## 2013-11-08 NOTE — Progress Notes (Signed)
  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 . . . . . . . The patient presents for routine under treatment assessment.                                   The patient is without complaint some itching in the inferior aspect of breast. She denies any discomfort or fatigue at this time.                                 Set-up films were reviewed.                                 The chart was checked. Physical Findings. . .  height is 5\' 4"  (1.626 m) and weight is 157 lb 12.8 oz (71.578 kg). Her oral temperature is 98.5 F (36.9 C). Her blood pressure is 131/53 and her pulse is 57. Her respiration is 16. . The lungs are clear. The heart has a regular rhythm and rate. The reconstructed left chest area shows erythema along the inferior aspect of the treatment area but no skin breakdown. Impression . . . . . . . The patient is tolerating radiation. Plan . . . . . . . . . . . . Continue treatment as planned.  ________________________________   Blair Promise, PhD, MD

## 2013-11-09 ENCOUNTER — Ambulatory Visit
Admission: RE | Admit: 2013-11-09 | Discharge: 2013-11-09 | Disposition: A | Discharge status: Home / Self Care | Payer: BC Managed Care – PPO | Source: Ambulatory Visit | Attending: Radiation Oncology | Admitting: Radiation Oncology

## 2013-11-09 ENCOUNTER — Ambulatory Visit (HOSPITAL_BASED_OUTPATIENT_CLINIC_OR_DEPARTMENT_OTHER): Payer: BC Managed Care – PPO | Admitting: Oncology

## 2013-11-09 ENCOUNTER — Other Ambulatory Visit (HOSPITAL_BASED_OUTPATIENT_CLINIC_OR_DEPARTMENT_OTHER): Payer: BC Managed Care – PPO

## 2013-11-09 ENCOUNTER — Telehealth: Payer: Self-pay | Admitting: Oncology

## 2013-11-09 ENCOUNTER — Ambulatory Visit: Payer: BC Managed Care – PPO

## 2013-11-09 VITALS — BP 136/55 | HR 58 | Temp 98.3°F | Resp 18 | Ht 64.0 in | Wt 159.9 lb

## 2013-11-09 DIAGNOSIS — R5383 Other fatigue: Secondary | ICD-10-CM

## 2013-11-09 DIAGNOSIS — C50412 Malignant neoplasm of upper-outer quadrant of left female breast: Secondary | ICD-10-CM

## 2013-11-09 DIAGNOSIS — Z510 Encounter for antineoplastic radiation therapy: Secondary | ICD-10-CM | POA: Diagnosis not present

## 2013-11-09 DIAGNOSIS — C50812 Malignant neoplasm of overlapping sites of left female breast: Secondary | ICD-10-CM

## 2013-11-09 DIAGNOSIS — Z170 Estrogen receptor positive status [ER+]: Secondary | ICD-10-CM

## 2013-11-09 LAB — COMPREHENSIVE METABOLIC PANEL (CC13)
ALK PHOS: 104 U/L (ref 40–150)
ALT: 15 U/L (ref 0–55)
ANION GAP: 6 meq/L (ref 3–11)
AST: 17 U/L (ref 5–34)
Albumin: 3.5 g/dL (ref 3.5–5.0)
BILIRUBIN TOTAL: 0.63 mg/dL (ref 0.20–1.20)
BUN: 10.7 mg/dL (ref 7.0–26.0)
CO2: 29 meq/L (ref 22–29)
CREATININE: 0.8 mg/dL (ref 0.6–1.1)
Calcium: 9.6 mg/dL (ref 8.4–10.4)
Chloride: 109 mEq/L (ref 98–109)
Glucose: 98 mg/dl (ref 70–140)
Potassium: 3.4 mEq/L — ABNORMAL LOW (ref 3.5–5.1)
SODIUM: 143 meq/L (ref 136–145)
TOTAL PROTEIN: 6.5 g/dL (ref 6.4–8.3)

## 2013-11-09 LAB — CBC WITH DIFFERENTIAL/PLATELET
BASO%: 0.6 % (ref 0.0–2.0)
Basophils Absolute: 0 10*3/uL (ref 0.0–0.1)
EOS%: 1.8 % (ref 0.0–7.0)
Eosinophils Absolute: 0.1 10*3/uL (ref 0.0–0.5)
HCT: 37.3 % (ref 34.8–46.6)
HGB: 12.4 g/dL (ref 11.6–15.9)
LYMPH%: 19 % (ref 14.0–49.7)
MCH: 28.6 pg (ref 25.1–34.0)
MCHC: 33.2 g/dL (ref 31.5–36.0)
MCV: 85.9 fL (ref 79.5–101.0)
MONO#: 0.6 10*3/uL (ref 0.1–0.9)
MONO%: 12.5 % (ref 0.0–14.0)
NEUT#: 3.3 10*3/uL (ref 1.5–6.5)
NEUT%: 66.1 % (ref 38.4–76.8)
PLATELETS: 171 10*3/uL (ref 145–400)
RBC: 4.34 10*6/uL (ref 3.70–5.45)
RDW: 12.7 % (ref 11.2–14.5)
WBC: 5 10*3/uL (ref 3.9–10.3)
lymph#: 0.9 10*3/uL (ref 0.9–3.3)

## 2013-11-09 NOTE — Progress Notes (Signed)
[place]  [phone]) [phone] Fax:(336) [phone]     ID: Deanna Buck DOB: [birth year]  MR#: [id number]  VVO#:[id number]  Patient Care Team: Rowe Clack, MD as PCP - General Daria Pastures, MD as Consulting Physician (Obstetrics and Gynecology) Jarome Matin, MD as Consulting Physician (Dermatology) Chauncey Cruel, MD as Consulting Physician (Oncology) Rolm Bookbinder M.D., Gery Pray, Arnoldo Hooker [place]  CHIEF COMPLAINT: Early stage multifocal breast cancer  CURRENT TREATMENT:adjuvant radiation;  tamoxifen    BREAST CANCER HISTORY: From the original intake note:  Deanna Buck had routine yearly screening mammography with tomography 05/27/2013 at the [facility] Center, showing a possible area of distortion in the left breast. Left diagnostic mammography and ultrasonography 06/13/2013 showed the breast density to be category C. In the upper outer quadrant of the left breast there was a mass measuring approximately 1.8 cm. It was not palpable on exam. Ultrasound showed a hypoechoic irregular mass measuring 1.3 cm correlating with the mammographic finding. In addition, a smaller, 1.0 cm mass was detected approximately 3 cm from the index lesion. The left axilla was negative.  Biopsy of both masses in the left breast 06/22/2013 showed ([id number]) morphologically similar invasive mammary carcinoma, grade 1 or 2, estrogen receptor positive at 93%, progesterone receptor positive at 91%, both with strong staining intensity, with an MIB-1 of 11%, and no HER-2 amplification, the signals ratio being 1.03 and the number per cell 1.55.  On 06/29/2013 the patient underwent bilateral breast MRI at [facility]. By MRI the larger left breast mass measured 3.3 cm. The second, smaller mass measured 1.1 cm. Combined the masses measures 3.9 cm maximally. [By mammography, the masses were 2.4 cm apart with a combined diameter of 4.9 cm.]  The patient's subsequent history is as  detailed below  INTERVAL HISTORY: Deanna Buck returns today for followup of her breast cancer accompanied by her husband Deanna Buck. Since her last visit here she started her radiation treatments, which are scheduled to end just before Thanksgiving's. So far she is tolerating them well, with some fatigue as her only side effect. Also, on 09/22/2013 she started tamoxifen. Remarkably she is having no side effects from that. She has not noticed any change in her very minimal hot flashes and has had no problems with vaginal witness or dryness.  REVIEW OF SYSTEMS: Deanna Buck does well from 7:30 when she wakes up to about 3:30 in the afternoon. After that though she needs to take a nap. This is not something she used to do. It is clearly related to the radiation. She goes to bed about 9:30 or 10 and sleeps through the night. She denies any skin changes or peeling. She does get a little bit of itching over her skin in the radiation port area. She uses the creams appropriately. She has recurrent eczema problem on the top of her left foot, and occasionally she has some back and joint pains. She does not exercise regularly except to walk her dog. Overall it detailed review of systems today was benign  PAST MEDICAL HISTORY: Past Medical History  Diagnosis Date  . ECZEMA   . ANXIETY   . GERD   . OA (osteoarthritis)   . Abnormal mammogram 05/2013    L breast  . Full dentures   . Wears contact lenses     also wears glasses  . Breast cancer     left breast    PAST SURGICAL HISTORY: Past Surgical History  Procedure Laterality Date  . 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 Breast lumpectomy  1982    rt and lt benign  . Breast reconstruction with placement of tissue expander and flex hd (acellular hydrated dermis) Left 08/04/2013    Procedure: BREAST RECONSTRUCTION WITH PLACEMENT OF TISSUE EXPANDER AND  POSSIBLE FLEX HD (ACELLULAR HYDRATED DERMIS);  Surgeon:  [name], MD;  Location: [facility];  Service: Plastics;  Laterality: Left;  . Incision and drainage of wound Left 08/30/2013    Procedure: DEBRIDEMENT OF LEFT BREAST;  Surgeon: [name], MD;  Location: [facility];  Service: Plastics;  Laterality: Left;    FAMILY HISTORY Family History  Problem Relation Age of Onset  . Breast cancer Mother   . Dementia Mother   . Arthritis Other   . Hypertension Other     parent  . Hyperlipidemia Other     parent  . Kidney disease Other     parent   the patient's father died at the age of 26 with congestive heart failure. The patient's mother died at the age of 65. She was diagnosed with breast cancer at the age of 47. The only other breast cancer in the family was on the opposite, father's side, a first cousin diagnosed in her 51s. The patient had one brother who died from a myocardial infarction. She had no sisters.  GYNECOLOGIC HISTORY:  No LMP recorded. Patient is postmenopausal. Menarche age 62, first live birth age 30, the patient is [place] P2. She underwent menopause approximately 2002. She did not take hormone replacement. She did take birth control pills remotely for approximately 13 years, with no complications.  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 Her children from a prior marriage are Deanna Buck, who is an Optometrist, and Deanna Buck, who is also an Optometrist. Both live in [place]. The patient's husband Deanna Buck work for [employer]. The patient has 2 grandchildren. She is not a Ambulance person.    ADVANCED DIRECTIVES: In place   HEALTH MAINTENANCE: History  Substance Use Topics  . Smoking status: Never Smoker   . Smokeless tobacco: Not on file     Comment: Married, but in stressful relationship with spouse  . Alcohol Use: Yes     Comment: rare glass wine     Colonoscopy:  PAP:  Bone density: 03/31/2011 ad lib. are showing significant osteopenia, with the  lowest T score at -2.4  Lipid panel:  Allergies  Allergen Reactions  . Morphine And Related Shortness Of Breath  . Sulfonamide Derivatives Hives and Itching  . Banana Other (See Comments)    Pain  . Penicillins Other (See Comments)    Dr. Sherolyn Buba told her she was allergic to PCN.    Current Outpatient Prescriptions  Medication Sig Dispense Refill  . acetaminophen (TYLENOL) 325 MG tablet Take 650 mg by mouth.      . ALPRAZolam (XANAX) 0.5 MG tablet Take 1 tablet (0.5 mg total) by mouth 3 (three) times daily as needed for anxiety.  30 tablet  1  . clindamycin (CLEOCIN) 300 MG capsule Take 1 capsule (300 mg total) by mouth 3 (three) times daily.  21 capsule  0  . diazepam (VALIUM) 5 MG tablet Take 5 mg by mouth every 6 (six) hours as needed for anxiety. Per patient report      . esomeprazole (NEXIUM) 40 MG capsule Take 40 mg by mouth daily at 12 noon.      Marland Kitchen  hyaluronate sodium (RADIAPLEXRX) GEL Apply 1 application topically once.      . hydrochlorothiazide (HYDRODIURIL) 25 MG tablet TAKE 1 TABLET BY MOUTH DAILY  90 tablet  3  . lidocaine-prilocaine (EMLA) cream Apply to left chest prior to expansion 30 min      . nadolol (CORGARD) 40 MG tablet TAKE ONE-HALF (1/2) TABLET DAILY  45 tablet  3  . non-metallic deodorant (ALRA) MISC Apply 1 application topically daily as needed.      . tamoxifen (NOLVADEX) 20 MG tablet       . UNABLE TO FIND 174.4 Left Mastectomy  L8015-Post-Mastectomy Garment-2  1 each  0  . valACYclovir (VALTREX) 1000 MG tablet Take 1 tablet (1,000 mg total) by mouth daily as needed.  30 tablet  0  . vitamin B-12 (CYANOCOBALAMIN) 100 MCG tablet Take 50 mcg by mouth daily.       No current facility-administered medications for this visit.    OBJECTIVE:  Middle-aged white woman in no acute distress Filed Vitals:   11/09/13 1249  BP: 136/55  Pulse: 58  Temp: 98.3 F (36.8 C)  Resp: 18     Body mass index is 27.43 kg/(m^2).    ECOG FS:1 - Symptomatic but  completely ambulatory  Sclerae unicteric, pupils round and equal Oropharynx clear, teeth in good repair No cervical or supraclavicular adenopathy Lungs no rales or rhonchi Heart regular rate and rhythm Abd soft, nontender, positive bowel sounds MSK no focal spinal tenderness, no upper extremity lymphedema Neuro: nonfocal, well oriented, appropriate affect Breasts: The right breast is unremarkable. The left breast is status post mastectomy with implant placement. There is minimal erythema secondary to the radiation. I do not palpate any suspicious area over the last chest wall or left axilla.    LAB RESULTS:  CMP     Component Value Date/Time   NA 143 11/09/2013 1200   NA 140 08/01/2013 1210   K 3.4* 11/09/2013 1200   K 4.2 08/01/2013 1210   CL 103 08/01/2013 1210   CO2 29 11/09/2013 1200   CO2 27 08/01/2013 1210   GLUCOSE 98 11/09/2013 1200   GLUCOSE 95 08/01/2013 1210   BUN 10.7 11/09/2013 1200   BUN 17 08/01/2013 1210   CREATININE 0.8 11/09/2013 1200   CREATININE 0.69 08/01/2013 1210   CALCIUM 9.6 11/09/2013 1200   CALCIUM 10.2 08/01/2013 1210   PROT 6.5 11/09/2013 1200   PROT 7.2 06/23/2013 0936   ALBUMIN 3.5 11/09/2013 1200   ALBUMIN 4.0 06/23/2013 0936   AST 17 11/09/2013 1200   AST 17 06/23/2013 0936   ALT 15 11/09/2013 1200   ALT 14 06/23/2013 0936   ALKPHOS 104 11/09/2013 1200   ALKPHOS 95 06/23/2013 0936   BILITOT 0.63 11/09/2013 1200   BILITOT 1.2 06/23/2013 0936   GFRNONAA 88* 08/01/2013 1210   GFRAA >90 08/01/2013 1210    I No results found for this basename: SPEP,  UPEP,   kappa and lambda light chains    Lab Results  Component Value Date   WBC 5.0 11/09/2013   NEUTROABS 3.3 11/09/2013   HGB 12.4 11/09/2013   HCT 37.3 11/09/2013   MCV 85.9 11/09/2013   PLT 171 11/09/2013      Chemistry      Component Value Date/Time   NA 143 11/09/2013 1200   NA 140 08/01/2013 1210   K 3.4* 11/09/2013 1200   K 4.2 08/01/2013 1210   CL 103 08/01/2013 1210   CO2 29  11/09/2013  1200   CO2 27 08/01/2013 1210   BUN 10.7 11/09/2013 1200   BUN 17 08/01/2013 1210   CREATININE 0.8 11/09/2013 1200   CREATININE 0.69 08/01/2013 1210      Component Value Date/Time   CALCIUM 9.6 11/09/2013 1200   CALCIUM 10.2 08/01/2013 1210   ALKPHOS 104 11/09/2013 1200   ALKPHOS 95 06/23/2013 0936   AST 17 11/09/2013 1200   AST 17 06/23/2013 0936   ALT 15 11/09/2013 1200   ALT 14 06/23/2013 0936   BILITOT 0.63 11/09/2013 1200   BILITOT 1.2 06/23/2013 0936       No results found for this basename: LABCA2    No components found with this basename: LABCA125    No results found for this basename: INR,  in the last 168 hours  Urinalysis    Component Value Date/Time   COLORURINE YELLOW 06/23/2013 Deanna Buck 06/23/2013 0936   LABSPEC 1.020 06/23/2013 0936   PHURINE 7.0 06/23/2013 0936   GLUCOSEU NEGATIVE 06/23/2013 0936   GLUCOSEU NEGATIVE 12/26/2008 1615   HGBUR NEGATIVE 06/23/2013 0936   HGBUR negative 09/11/2009 1413   BILIRUBINUR NEGATIVE 06/23/2013 0936   BILIRUBINUR neg 02/17/2011 1625   KETONESUR NEGATIVE 06/23/2013 0936   PROTEINUR neg 02/17/2011 1625   PROTEINUR NEGATIVE 12/26/2008 1615   UROBILINOGEN 0.2 06/23/2013 0936   UROBILINOGEN 0.2 02/17/2011 1625   NITRITE NEGATIVE 06/23/2013 0936   NITRITE pos 02/17/2011 1625   LEUKOCYTESUR NEGATIVE 06/23/2013 0936    STUDIES: No results found.  ASSESSMENT: 68 y.o. [place] woman status post biopsy of 2 separate left breast masses 06/22/2013 for a clinical mT1c N0, stage IA carcinoma, grade 1 or 2, estrogen receptor 93% positive, progesterone receptor 91% positive, with an MIB-1 of 11%, and no HER-2 amplification  (1) status post left mastectomy and sentinel lymph node sampling 08/04/2013 for a pT2 pN1, stage IIB invasive ductal carcinoma, grade 2, with negative margins, and repeat HER-2 again negative  (2) Oncotype score of 13 predicts a risk of recurrence or mortality within 5 years of 10% with tamoxifen  alone, and 11% with tamoxifen plus chemotherapy; the patient was offered chemotherapy vs. participation in S007, but decided against both  (3) tamoxifen started 09/22/2013  (4) radiation to be completed 12/07/2013  PLAN: Marquia is tolerating tamoxifen well and the plan will be to continue that for at least 2 years before considering switching to an aromatase inhibitor. So far she is doing well with the radiation also.   The main issue of course is fatigued. Ideally she was started on exercise program at this point, but she is somewhat restricted by reconstruction, which we'll continue write through January. Today I gave her a copy of the [employer] pamphlet and likely she will not be able to get started on those exercises until February or so. She also is aware of the "new normal" group and we'll be watching to join in next time it is to convene.  Otherwise she will return to see me again in February. Tarina has a good understanding of this plan. She agrees with it. She knows to call for any problems that may develop before the next visit here.   Chauncey Cruel, MD   11/09/2013 12:57 PM

## 2013-11-09 NOTE — Telephone Encounter (Signed)
lvm for pt regarding to Feb 2016...mailed pt appt sched/avs and letter

## 2013-11-09 NOTE — Addendum Note (Signed)
Addended by: Laureen Abrahams on: 11/09/2013 05:08 PM   Modules accepted: Orders

## 2013-11-10 ENCOUNTER — Telehealth: Payer: Self-pay | Admitting: Oncology

## 2013-11-10 ENCOUNTER — Ambulatory Visit: Payer: BC Managed Care – PPO

## 2013-11-10 ENCOUNTER — Ambulatory Visit
Admission: RE | Admit: 2013-11-10 | Discharge: 2013-11-10 | Disposition: A | Discharge status: Home / Self Care | Payer: BC Managed Care – PPO | Source: Ambulatory Visit | Attending: Radiation Oncology | Admitting: Radiation Oncology

## 2013-11-10 DIAGNOSIS — Z510 Encounter for antineoplastic radiation therapy: Secondary | ICD-10-CM | POA: Diagnosis not present

## 2013-11-10 NOTE — Telephone Encounter (Signed)
Confirm appt d/t for Feb 2016. Labs to be done 1wk prior to dr visit. Mailed cal

## 2013-11-11 ENCOUNTER — Ambulatory Visit
Admission: RE | Admit: 2013-11-11 | Discharge: 2013-11-11 | Disposition: A | Discharge status: Home / Self Care | Payer: BC Managed Care – PPO | Source: Ambulatory Visit | Attending: Radiation Oncology | Admitting: Radiation Oncology

## 2013-11-11 ENCOUNTER — Ambulatory Visit: Payer: BC Managed Care – PPO

## 2013-11-11 ENCOUNTER — Telehealth: Payer: Self-pay | Admitting: Oncology

## 2013-11-11 DIAGNOSIS — Z510 Encounter for antineoplastic radiation therapy: Secondary | ICD-10-CM | POA: Diagnosis not present

## 2013-11-11 DIAGNOSIS — C50412 Malignant neoplasm of upper-outer quadrant of left female breast: Secondary | ICD-10-CM

## 2013-11-11 MED ORDER — RADIAPLEXRX EX GEL
Freq: Once | CUTANEOUS | Status: AC
  Administered 2013-11-11: 09:00:00 via TOPICAL

## 2013-11-11 NOTE — Telephone Encounter (Signed)
Nollie left a message stating that she was out of radiaplex gel and needs a refill.  Called her back and left a message that a tube will be left with the therapists on l[place] for her.

## 2013-11-14 ENCOUNTER — Ambulatory Visit: Payer: BC Managed Care – PPO

## 2013-11-14 ENCOUNTER — Ambulatory Visit
Admission: RE | Admit: 2013-11-14 | Discharge: 2013-11-14 | Disposition: A | Discharge status: Home / Self Care | Payer: BC Managed Care – PPO | Source: Ambulatory Visit | Attending: Radiation Oncology | Admitting: Radiation Oncology

## 2013-11-14 DIAGNOSIS — Z510 Encounter for antineoplastic radiation therapy: Secondary | ICD-10-CM | POA: Diagnosis not present

## 2013-11-15 ENCOUNTER — Ambulatory Visit: Payer: BC Managed Care – PPO

## 2013-11-15 ENCOUNTER — Ambulatory Visit
Admission: RE | Admit: 2013-11-15 | Discharge: 2013-11-15 | Disposition: A | Discharge status: Home / Self Care | Payer: BC Managed Care – PPO | Source: Ambulatory Visit | Attending: Radiation Oncology | Admitting: Radiation Oncology

## 2013-11-15 ENCOUNTER — Encounter: Payer: Self-pay | Admitting: Radiation Oncology

## 2013-11-15 VITALS — BP 131/55 | HR 69 | Temp 98.7°F | Resp 12 | Ht 64.0 in | Wt 156.3 lb

## 2013-11-15 DIAGNOSIS — C50912 Malignant neoplasm of unspecified site of left female breast: Secondary | ICD-10-CM | POA: Insufficient documentation

## 2013-11-15 DIAGNOSIS — Z510 Encounter for antineoplastic radiation therapy: Secondary | ICD-10-CM | POA: Diagnosis not present

## 2013-11-15 DIAGNOSIS — C50412 Malignant neoplasm of upper-outer quadrant of left female breast: Secondary | ICD-10-CM

## 2013-11-15 MED ORDER — BIAFINE EX EMUL
Freq: Once | CUTANEOUS | Status: AC
  Administered 2013-11-15: 11:00:00 via TOPICAL

## 2013-11-15 NOTE — Progress Notes (Signed)
Deanna Buck has completed 17 fractions to her left chest.  She denies pain.  She has redness with a raised rash on the upper portion of her left breast and also underneath her breast.  She reports it is itching occasionally.  She is using radiaplex 3-4 times daily.  Gave her biafine to use on the itching areas. She reports taking naps in the afternoons.  She continues to take tamoxifen.

## 2013-11-15 NOTE — Progress Notes (Signed)
  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 . . . . . . . The patient presents for routine under treatment assessment.                                   The patient is without complaint except for some mild itching in the radiation fields and fatigue                                 Set-up films were reviewed.                                 The chart was checked. Physical Findings. . .  height is 5\' 4"  (1.626 m) and weight is 156 lb 4.8 oz (70.897 kg). Her oral temperature is 98.7 F (37.1 C). Her blood pressure is 131/55 and her pulse is 69. Her respiration is 12. . The left chest region shows erythema but no skin breakdown. The axillary and supraclavicular region also shows some erythema. Impression . . . . . . . The patient is tolerating radiation. Plan . . . . . . . . . . . . Continue treatment as planned.  ________________________________   Blair Promise, PhD, MD

## 2013-11-16 ENCOUNTER — Ambulatory Visit
Admission: RE | Admit: 2013-11-16 | Discharge: 2013-11-16 | Disposition: A | Discharge status: Home / Self Care | Payer: BC Managed Care – PPO | Source: Ambulatory Visit | Attending: Radiation Oncology | Admitting: Radiation Oncology

## 2013-11-16 ENCOUNTER — Ambulatory Visit: Payer: BC Managed Care – PPO

## 2013-11-16 DIAGNOSIS — Z510 Encounter for antineoplastic radiation therapy: Secondary | ICD-10-CM | POA: Diagnosis not present

## 2013-11-17 ENCOUNTER — Ambulatory Visit
Admission: RE | Admit: 2013-11-17 | Discharge: 2013-11-17 | Disposition: A | Discharge status: Home / Self Care | Payer: BC Managed Care – PPO | Source: Ambulatory Visit | Attending: Radiation Oncology | Admitting: Radiation Oncology

## 2013-11-17 ENCOUNTER — Ambulatory Visit: Payer: BC Managed Care – PPO

## 2013-11-17 DIAGNOSIS — Z510 Encounter for antineoplastic radiation therapy: Secondary | ICD-10-CM | POA: Diagnosis not present

## 2013-11-18 ENCOUNTER — Ambulatory Visit: Payer: BC Managed Care – PPO

## 2013-11-18 ENCOUNTER — Ambulatory Visit
Admission: RE | Admit: 2013-11-18 | Discharge: 2013-11-18 | Disposition: A | Discharge status: Home / Self Care | Payer: BC Managed Care – PPO | Source: Ambulatory Visit | Attending: Radiation Oncology | Admitting: Radiation Oncology

## 2013-11-18 DIAGNOSIS — Z510 Encounter for antineoplastic radiation therapy: Secondary | ICD-10-CM | POA: Diagnosis not present

## 2013-11-21 ENCOUNTER — Ambulatory Visit: Payer: BC Managed Care – PPO

## 2013-11-21 ENCOUNTER — Ambulatory Visit
Admission: RE | Admit: 2013-11-21 | Discharge: 2013-11-21 | Disposition: A | Discharge status: Home / Self Care | Payer: BC Managed Care – PPO | Source: Ambulatory Visit | Attending: Radiation Oncology | Admitting: Radiation Oncology

## 2013-11-21 DIAGNOSIS — Z510 Encounter for antineoplastic radiation therapy: Secondary | ICD-10-CM | POA: Diagnosis not present

## 2013-11-22 ENCOUNTER — Ambulatory Visit
Admission: RE | Admit: 2013-11-22 | Discharge: 2013-11-22 | Disposition: A | Discharge status: Home / Self Care | Payer: BC Managed Care – PPO | Source: Ambulatory Visit | Attending: Radiation Oncology | Admitting: Radiation Oncology

## 2013-11-22 ENCOUNTER — Ambulatory Visit: Payer: BC Managed Care – PPO

## 2013-11-22 DIAGNOSIS — Z510 Encounter for antineoplastic radiation therapy: Secondary | ICD-10-CM | POA: Diagnosis not present

## 2013-11-23 ENCOUNTER — Ambulatory Visit
Admission: RE | Admit: 2013-11-23 | Discharge: 2013-11-23 | Disposition: A | Discharge status: Home / Self Care | Payer: BC Managed Care – PPO | Source: Ambulatory Visit | Attending: Radiation Oncology | Admitting: Radiation Oncology

## 2013-11-23 ENCOUNTER — Ambulatory Visit: Payer: BC Managed Care – PPO

## 2013-11-23 ENCOUNTER — Encounter: Payer: Self-pay | Admitting: Radiation Oncology

## 2013-11-23 VITALS — BP 124/54 | HR 62 | Temp 98.1°F | Resp 16 | Wt 157.1 lb

## 2013-11-23 DIAGNOSIS — Z510 Encounter for antineoplastic radiation therapy: Secondary | ICD-10-CM | POA: Diagnosis not present

## 2013-11-23 DIAGNOSIS — C50412 Malignant neoplasm of upper-outer quadrant of left female breast: Secondary | ICD-10-CM | POA: Diagnosis not present

## 2013-11-23 MED ORDER — BIAFINE EX EMUL
Freq: Every day | CUTANEOUS | Status: DC
  Administered 2013-11-23: 11:00:00 via TOPICAL

## 2013-11-23 MED ORDER — RADIAPLEXRX EX GEL
Freq: Once | CUTANEOUS | Status: AC
  Administered 2013-11-23: 11:00:00 via TOPICAL

## 2013-11-23 NOTE — Progress Notes (Signed)
Weekly rad txs, 23/29  Left breast, rash medially and on shoulder and back of shoulder,  Bright erythema, using biafine and radiaplex several times a day. , tightness under axilla , still c/o itching and ocasional burning under arm, appetite good,  Energy level has gotten a little better this week,no naps, staying busy 10:58 AM

## 2013-11-23 NOTE — Progress Notes (Signed)
  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 . . . . . . . The patient presents for routine under treatment assessment.                                   The patient is without complaint. She does have some itching along the left chest axillary and supraclavicular region but this is managed well with Biafine. She has minimal discomfort in the left chest. She usually takes half of a Tylenol for this issue but infrequently.                                 Set-up films were reviewed.                                 The chart was checked. Physical Findings. . .  weight is 157 lb 1.6 oz (71.26 kg). Her oral temperature is 98.1 F (36.7 C). Her blood pressure is 124/54 and her pulse is 62. Her respiration is 16. . The reconstructed left chest area shows erythema and dry desquamation but no moist desquamation. The supraclavicular and axillary areas also showed erythema but no skin breakdown. Impression . . . . . . . The patient is tolerating radiation. Plan . . . . . . . . . . . . Continue treatment as planned.  ________________________________   Blair Promise, PhD, MD

## 2013-11-24 ENCOUNTER — Ambulatory Visit: Payer: BC Managed Care – PPO

## 2013-11-24 ENCOUNTER — Ambulatory Visit
Admission: RE | Admit: 2013-11-24 | Discharge: 2013-11-24 | Disposition: A | Discharge status: Home / Self Care | Payer: BC Managed Care – PPO | Source: Ambulatory Visit | Attending: Radiation Oncology | Admitting: Radiation Oncology

## 2013-11-24 DIAGNOSIS — Z510 Encounter for antineoplastic radiation therapy: Secondary | ICD-10-CM | POA: Diagnosis not present

## 2013-11-25 ENCOUNTER — Ambulatory Visit: Payer: BC Managed Care – PPO

## 2013-11-25 ENCOUNTER — Ambulatory Visit
Admission: RE | Admit: 2013-11-25 | Discharge: 2013-11-25 | Disposition: A | Discharge status: Home / Self Care | Payer: BC Managed Care – PPO | Source: Ambulatory Visit | Attending: Radiation Oncology | Admitting: Radiation Oncology

## 2013-11-25 DIAGNOSIS — Z510 Encounter for antineoplastic radiation therapy: Secondary | ICD-10-CM | POA: Diagnosis not present

## 2013-11-28 ENCOUNTER — Ambulatory Visit
Admission: RE | Admit: 2013-11-28 | Discharge: 2013-11-28 | Disposition: A | Discharge status: Home / Self Care | Payer: BC Managed Care – PPO | Source: Ambulatory Visit | Attending: Radiation Oncology | Admitting: Radiation Oncology

## 2013-11-28 ENCOUNTER — Ambulatory Visit: Payer: BC Managed Care – PPO

## 2013-11-28 DIAGNOSIS — Z510 Encounter for antineoplastic radiation therapy: Secondary | ICD-10-CM | POA: Diagnosis not present

## 2013-11-29 ENCOUNTER — Ambulatory Visit
Admission: RE | Admit: 2013-11-29 | Discharge: 2013-11-29 | Disposition: A | Discharge status: Home / Self Care | Payer: BC Managed Care – PPO | Source: Ambulatory Visit | Attending: Radiation Oncology | Admitting: Radiation Oncology

## 2013-11-29 ENCOUNTER — Encounter: Payer: Self-pay | Admitting: Radiation Oncology

## 2013-11-29 ENCOUNTER — Ambulatory Visit: Payer: BC Managed Care – PPO

## 2013-11-29 VITALS — BP 133/61 | HR 56 | Temp 98.1°F | Resp 16 | Ht 64.0 in | Wt 158.1 lb

## 2013-11-29 DIAGNOSIS — Z510 Encounter for antineoplastic radiation therapy: Secondary | ICD-10-CM | POA: Diagnosis not present

## 2013-11-29 DIAGNOSIS — C50412 Malignant neoplasm of upper-outer quadrant of left female breast: Secondary | ICD-10-CM | POA: Diagnosis not present

## 2013-11-29 MED ORDER — RADIAPLEXRX EX GEL
Freq: Once | CUTANEOUS | Status: AC
  Administered 2013-11-29: 13:00:00 via TOPICAL

## 2013-11-29 NOTE — Progress Notes (Signed)
  Radiation Oncology         [phone] ________________________________  Name: Deanna Buck MRN: [id number]  Date: 11/29/2013  DOB: [birth year]  Weekly Radiation Therapy Management  Current Dose: 48.6 Gy     Planned Dose:  50.4 Gy  Narrative . . . . . . . . The patient presents for routine under treatment assessment.                                   The patient is having fatigue at this time. She is also noticed a lot of discomfort and itching along the lateral aspect of her chest and axillary region                                 Set-up films were reviewed.                                 The chart was checked. Physical Findings. . .  height is 5\' 4"  (1.626 m) and weight is 158 lb 1.6 oz (71.714 kg). Her oral temperature is 98.1 F (36.7 C). Her blood pressure is 133/61 and her pulse is 56. Her respiration is 16. . The lungs are clear. The heart has a regular rhythm and rate. The reconstructed left chest shows significant erythema and radiation dermatitis but no moist desquamation.   Impression . . . . . . . The patient is tolerating radiation. Plan . . . . . . . . . . . . Continue treatment as planned. The patient will followup in 2 weeks after her completion tomorrow. Patient was instructed on use of triple antibiotic ointment in the event that she develops moist desquamation. Patient has also been given gel pads to place along the left lateral chest wall area to see if this will help with her discomfort.  ________________________________   Blair Promise, PhD, MD

## 2013-11-29 NOTE — Addendum Note (Signed)
Encounter addended by: Jacqulyn Liner, RN on: 11/29/2013 12:55 PM<BR>     Documentation filed: Medications, Dx Association, Orders

## 2013-11-29 NOTE — Progress Notes (Signed)
Deanna Buck has completed 27 fractions to her left chest wall and axilla.  She reports pain in her left chest/underarm at a 2/10 during the day and 4/10 at night.  She reports her fatigue is improving.  The skin on her left chest and left upper back is red and peeling.  The skin under her left arm is red.  She is using biafine on her left upper chest and back and radiaplex on the rest of the treatment area.  She is requesting a refill on radiaplex and is wondering about gel pads.  Another tube of radiaplex has been given along with gel pads.  She has also been given a one month follow up card.

## 2013-11-29 NOTE — Addendum Note (Signed)
Encounter addended by: Jacqulyn Liner, RN on: 11/29/2013  1:01 PM<BR>     Documentation filed: Inpatient MAR

## 2013-11-30 ENCOUNTER — Ambulatory Visit
Admission: RE | Admit: 2013-11-30 | Discharge: 2013-11-30 | Disposition: A | Discharge status: Home / Self Care | Payer: BC Managed Care – PPO | Source: Ambulatory Visit | Attending: Radiation Oncology | Admitting: Radiation Oncology

## 2013-11-30 ENCOUNTER — Ambulatory Visit: Payer: BC Managed Care – PPO

## 2013-11-30 DIAGNOSIS — Z510 Encounter for antineoplastic radiation therapy: Secondary | ICD-10-CM | POA: Diagnosis not present

## 2013-12-01 ENCOUNTER — Ambulatory Visit: Payer: BC Managed Care – PPO

## 2013-12-02 ENCOUNTER — Ambulatory Visit: Payer: BC Managed Care – PPO

## 2013-12-05 ENCOUNTER — Ambulatory Visit: Payer: BC Managed Care – PPO

## 2013-12-06 ENCOUNTER — Ambulatory Visit: Payer: BC Managed Care – PPO

## 2013-12-07 ENCOUNTER — Ambulatory Visit: Payer: BC Managed Care – PPO

## 2013-12-14 ENCOUNTER — Encounter: Payer: Self-pay | Admitting: Oncology

## 2013-12-15 ENCOUNTER — Encounter: Payer: Self-pay | Admitting: Radiation Oncology

## 2013-12-15 ENCOUNTER — Ambulatory Visit
Admission: RE | Admit: 2013-12-15 | Discharge: 2013-12-15 | Disposition: A | Discharge status: Home / Self Care | Payer: BC Managed Care – PPO | Source: Ambulatory Visit | Attending: Radiation Oncology | Admitting: Radiation Oncology

## 2013-12-15 VITALS — BP 155/67 | HR 67 | Temp 98.7°F | Resp 16 | Ht 64.0 in | Wt 159.4 lb

## 2013-12-15 DIAGNOSIS — C50412 Malignant neoplasm of upper-outer quadrant of left female breast: Secondary | ICD-10-CM | POA: Diagnosis not present

## 2013-12-15 DIAGNOSIS — L599 Disorder of the skin and subcutaneous tissue related to radiation, unspecified: Secondary | ICD-10-CM | POA: Insufficient documentation

## 2013-12-15 DIAGNOSIS — Z79899 Other long term (current) drug therapy: Secondary | ICD-10-CM | POA: Diagnosis not present

## 2013-12-15 DIAGNOSIS — Z923 Personal history of irradiation: Secondary | ICD-10-CM | POA: Insufficient documentation

## 2013-12-15 DIAGNOSIS — C50912 Malignant neoplasm of unspecified site of left female breast: Secondary | ICD-10-CM

## 2013-12-15 MED ORDER — BIAFINE EX EMUL
Freq: Two times a day (BID) | CUTANEOUS | Status: DC
  Administered 2013-12-15: 17:00:00 via TOPICAL

## 2013-12-15 NOTE — Addendum Note (Signed)
Encounter addended by: Jenene Slicker, RN on: 12/15/2013  5:13 PM<BR>     Documentation filed: Inpatient MAR

## 2013-12-15 NOTE — Progress Notes (Addendum)
Smitty Pluck here for follow up after treatment to her left breast.  She denies pain.  She reports fatigue that is improving.  She is taking Tamoxifen.  The skin on her left breast is red with a small open/irritated area on the upper part of her breast.  Advised her to apply neosporin to this area.  The skin on her left upper back has hyperpigmentation. She continues to use radiaplex gel.

## 2013-12-15 NOTE — Progress Notes (Signed)
  Radiation Oncology         [phone] ________________________________  Name: Deanna Buck MRN: [id number]  Date: 12/15/2013  DOB: 1945-09-18  Follow-Up Visit Note  CC: Gwendolyn Grant, MD  Magrinat, Virgie Dad, MD    ICD-9-CM ICD-10-CM   1. Breast cancer of upper-outer quadrant of left female breast 174.4 C50.412 topical emolient (BIAFINE) emulsion    Diagnosis:  Stage II-B pT2, pN1 invasive ductal carcinoma of the left breast,  Interval Since Last Radiation:  2  weeks  Narrative:  The patient returns today for routine follow-up.  She is doing well at this time. She denies any significant fatigue itching or discomfort along the chest area. She does complain of tightness but only takes a Tylenol on occasion for discomfort.  She denies any chills or fever. Patient denies any drainage from the right chest area.                              ALLERGIES:  is allergic to morphine and related; sulfonamide derivatives; banana; and penicillins.  Meds: Current Outpatient Prescriptions  Medication Sig Dispense Refill  . hydrochlorothiazide (HYDRODIURIL) 25 MG tablet TAKE 1 TABLET BY MOUTH DAILY 90 tablet 3  . nadolol (CORGARD) 40 MG tablet TAKE ONE-HALF (1/2) TABLET DAILY 45 tablet 3  . tamoxifen (NOLVADEX) 20 MG tablet     . UNABLE TO FIND 174.4 Left Mastectomy  L8015-Post-Mastectomy Garment-2 1 each 0  . valACYclovir (VALTREX) 1000 MG tablet Take 1 tablet (1,000 mg total) by mouth daily as needed. 30 tablet 0  . vitamin B-12 (CYANOCOBALAMIN) 100 MCG tablet Take 50 mcg by mouth daily.    Marland Kitchen emollient (BIAFINE) cream Apply topically as needed.    Marland Kitchen esomeprazole (NEXIUM) 40 MG capsule Take 40 mg by mouth daily at 12 noon.    . hyaluronate sodium (RADIAPLEXRX) GEL Apply 1 application topically once. Tube given 11/23/13    . lidocaine-prilocaine (EMLA) cream Apply to left chest prior to expansion 30 min    . non-metallic deodorant (ALRA) MISC Apply 1 application topically daily as needed.      Current Facility-Administered Medications  Medication Dose Route Frequency Provider Last Rate Last Dose  . topical emolient (BIAFINE) emulsion   Topical BID Blair Promise, MD        Physical Findings: The patient is in no acute distress. Patient is alert and oriented.  height is 5\' 4"  (1.626 m) and weight is 159 lb 6.4 oz (72.303 kg). Her oral temperature is 98.7 F (37.1 C). Her blood pressure is 155/67 and her pulse is 67. Her respiration is 16. .  The lungs are clear. The heart has a regular rhythm and rate. Examination of the reconstructed left breast reveals continued erythema but no moist desquamation or signs of infection. One area along the superior chest region the patient had scratched and will continue using a triple antibiotic ointment to this area.  Lab Findings: Lab Results  Component Value Date   WBC 5.0 11/09/2013   HGB 12.4 11/09/2013   HCT 37.3 11/09/2013   MCV 85.9 11/09/2013   PLT 171 11/09/2013    Radiographic Findings: No results found.  Impression:  The patient is recovering from the effects of radiation.   Plan:  Routine followup in January. The patient will be seen by plastic surgery later this month for possible further enlargement of her tissue expander.  ____________________________________ Blair Promise, MD

## 2013-12-18 ENCOUNTER — Encounter: Payer: Self-pay | Admitting: Radiation Oncology

## 2013-12-18 NOTE — Progress Notes (Signed)
  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  Bolus was used on every other day basis to ensure adequate dose to the superficial aspects of the target area.  Narrative: The patient tolerated radiation treatment relatively well.   She had some fatigue during the end of treatment as well as some itching and discomfort along the chest wall area. Patient developed erythema and radiation dermatitis but no moist desquamation at the completion of her treatment  Plan: The patient has completed radiation treatment. The patient will return to radiation oncology clinic for routine followup in one month. I advised them to call or return sooner if they have any questions or concerns related to their recovery or treatment.  -----------------------------------  Blair Promise, PhD, MD

## 2013-12-18 NOTE — Progress Notes (Signed)
  Radiation Oncology         [phone] ________________________________  Name: Deanna Buck MRN: [id number]  Date: 10/19/2013  DOB: 1945/04/30  Optical Surface Tracking Plan:  Since intensity modulated radiotherapy (IMRT) and 3D conformal radiation treatment methods are predicated on accurate and precise positioning for treatment, intrafraction motion monitoring is medically necessary to ensure accurate and safe treatment delivery.  The ability to quantify intrafraction motion without excessive ionizing radiation dose can only be performed with optical surface tracking. Accordingly, surface imaging offers the opportunity to obtain 3D measurements of patient position throughout IMRT and 3D treatments without excessive radiation exposure.  I am ordering optical surface tracking for this patient's upcoming course of radiotherapy. ________________________________  Blair Promise, MD 10/19/2013 11:22 AM    Reference:   Ursula Alert, J, et al. Surface imaging-based analysis of intrafraction motion for breast radiotherapy patients.Journal of [place], n. 6, nov. 2014. ISSN [id number].   Available at: <http://www.jacmp.org/index.php/jacmp/article/view/4957>.

## 2014-01-10 ENCOUNTER — Ambulatory Visit (INDEPENDENT_AMBULATORY_CARE_PROVIDER_SITE_OTHER): Payer: BC Managed Care – PPO | Admitting: Internal Medicine

## 2014-01-10 ENCOUNTER — Encounter: Payer: Self-pay | Admitting: Internal Medicine

## 2014-01-10 VITALS — BP 140/70 | HR 67 | Temp 98.3°F | Resp 15 | Ht 64.0 in | Wt 155.8 lb

## 2014-01-10 DIAGNOSIS — J209 Acute bronchitis, unspecified: Secondary | ICD-10-CM

## 2014-01-10 MED ORDER — BENZONATATE 200 MG PO CAPS: 200.0000 mg | ORAL_CAPSULE | Freq: Three times a day (TID) | ORAL | Status: DC | PRN

## 2014-01-10 MED ORDER — AZITHROMYCIN 250 MG PO TABS: ORAL_TABLET | ORAL | Status: DC

## 2014-01-10 NOTE — Progress Notes (Signed)
Pre visit review using our clinic review tool, if applicable. No additional management support is needed unless otherwise documented below in the visit note. 

## 2014-01-10 NOTE — Patient Instructions (Signed)
Carry room temperature water and sip liberally after coughing. 

## 2014-01-10 NOTE — Progress Notes (Signed)
   Subjective:    Patient ID: Deanna Buck, female    DOB: 1945-10-03, 68 y.o.   MRN: [id number]  HPI  She describes a cough since 01/04/14. It is paroxysmal lasting minutes and worse at night She has a rattle without significant sputum production. It has been associated with wheezing and minimal shortness of breath.  In addition to the supine position; being overactive increases the cough.  Zicam and Mucinex have only been partially of benefit  Initially she had some postnasal drainage and sneezing but those symptoms have resolved. At this time she has no upper respiratory tract infection symptoms.  She is never smoked and has no history of asthma. She has had bronchitis in the past.  Review of Systems  Frontal headache, facial pain , nasal purulence,  sore throat , otic pain or otic discharge denied. No fever , chills or sweats.  No active extrinsic symptoms.    Objective:   Physical Exam  General appearance:good health ;well nourished; no acute distress or increased work of breathing is present.  No  lymphadenopathy about the head, neck, or axilla noted.   Eyes: No conjunctival inflammation or lid edema is present. There is no scleral icterus.  Ears:  External ear exam shows no significant lesions or deformities.  Otoscopic examination reveals clear canals, tympanic membranes are intact bilaterally without bulging, retraction, inflammation or discharge.  Nose:  External nasal examination shows no deformity or inflammation. Nasal mucosa are pink and moist without lesions or exudates. No septal dislocation or deviation.No obstruction to airflow.   Oral exam: Complete dentures; lips and gums are healthy appearing.There is no oropharyngeal erythema or exudate noted.   Neck:  No deformities, thyromegaly, masses, or tenderness noted.   Supple with full range of motion without pain.   Heart:  Normal rate and regular rhythm. S1 and S2 normal without gallop, murmur, click, rub or other  extra sounds.   Lungs:Chest clear to auscultation; no wheezes, rhonchi,rales ,or rubs present.No increased work of breathing.  Intermittent dry cough.  Extremities:  No cyanosis, edema, or clubbing  noted    Skin: Warm & dry w/o  tenting.       Assessment & Plan:  #1 acute bronchitis w/o bronchospasm  Plan: See orders and recommendations

## 2014-01-26 ENCOUNTER — Encounter: Payer: Self-pay | Admitting: Radiation Oncology

## 2014-01-26 ENCOUNTER — Ambulatory Visit
Admission: RE | Admit: 2014-01-26 | Discharge: 2014-01-26 | Disposition: A | Discharge status: Home / Self Care | Payer: BLUE CROSS/BLUE SHIELD | Source: Ambulatory Visit | Attending: Radiation Oncology | Admitting: Radiation Oncology

## 2014-01-26 VITALS — BP 132/68 | HR 66 | Temp 98.5°F | Resp 16 | Ht 64.0 in | Wt 160.1 lb

## 2014-01-26 DIAGNOSIS — C50412 Malignant neoplasm of upper-outer quadrant of left female breast: Secondary | ICD-10-CM

## 2014-01-26 NOTE — Progress Notes (Signed)
  Radiation Oncology         [phone] ________________________________  Name: Deanna Buck MRN: [id number]  Date: 01/26/2014  DOB: Apr 02, 1945  Follow-Up Visit Note  CC: Gwendolyn Grant, MD  Magrinat, Virgie Dad, MD    ICD-9-CM ICD-10-CM   1. 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  She is doing well at this time. Over the holidays she did have bronchitis but this has cleared. She denies any pain along the left chest wall area or problems with swelling in her left arm or hand. She denies any cough or breathing problems at this time.                              ALLERGIES:  is allergic to morphine and related; sulfonamide derivatives; banana; and penicillins.  Meds: Current Outpatient Prescriptions  Medication Sig Dispense Refill  . esomeprazole (NEXIUM) 40 MG capsule Take 40 mg by mouth as needed.     . hyaluronate sodium (RADIAPLEXRX) GEL Apply 1 application topically as needed. Tube given 11/23/13    . hydrochlorothiazide (HYDRODIURIL) 25 MG tablet TAKE 1 TABLET BY MOUTH DAILY 90 tablet 3  . nadolol (CORGARD) 40 MG tablet TAKE ONE-HALF (1/2) TABLET DAILY 45 tablet 3  . tamoxifen (NOLVADEX) 20 MG tablet     . UNABLE TO FIND 174.4 Left Mastectomy  L8015-Post-Mastectomy Garment-2 1 each 0  . emollient (BIAFINE) cream Apply topically as needed.    . lidocaine-prilocaine (EMLA) cream as needed. Apply to left chest prior to expansion 30 min    . non-metallic deodorant (ALRA) MISC Apply 1 application topically daily as needed.    . valACYclovir (VALTREX) 1000 MG tablet Take 1 tablet (1,000 mg total) by mouth daily as needed. (Patient not taking: Reported on 01/26/2014) 30 tablet 0  . vitamin B-12 (CYANOCOBALAMIN) 100 MCG tablet Take 50 mcg by mouth as needed.       No current facility-administered medications for this encounter.    Physical Findings: The patient is in no acute distress. Patient is alert and oriented.  height is 5\' 4"  (1.626 m) and weight is 160 lb 1.6 oz (72.621 kg). Her oral temperature is 98.5 F (36.9 C). Her blood pressure is 132/68 and her pulse is 66. Her respiration is 16. Marland Kitchen  No palpable subclavicular or axillary adenopathy. Lungs are clear to auscultation. The heart has a regular rhythm and rate. Examination of the left chest area reveals a reconstructed area. The patient's skin is well healed at this time without signs of infection or breakdown.  Lab Findings: Lab Results  Component Value Date   WBC 5.0 11/09/2013   HGB 12.4 11/09/2013   HCT 37.3 11/09/2013   MCV 85.9 11/09/2013   PLT 171 11/09/2013    Radiographic Findings: No results found.  Impression:  The patient is recovering from the effects of radiation.    Plan:  Routine follow-up in 6 months. The patient is tentatively scheduled for  reconstruction in mid to late April.  ____________________________________ Blair Promise, MD

## 2014-01-26 NOTE — Progress Notes (Signed)
Deanna Buck here for follow up after treatment to her left breast.  She denies pain.  She is taking tamoxifen.  She reports occasional fatigue.  She is going to have reconstruction surgery in April.  She reports her taste buds are off so she does not always feel like eating.  Her weight is stable.  The skin on her left chest and left upper back. has hyperpigmentation.  She has 2 small scabbed areas, one on her upper left chest and left shoulder.

## 2014-03-01 ENCOUNTER — Other Ambulatory Visit (HOSPITAL_BASED_OUTPATIENT_CLINIC_OR_DEPARTMENT_OTHER): Payer: BLUE CROSS/BLUE SHIELD

## 2014-03-01 DIAGNOSIS — C50412 Malignant neoplasm of upper-outer quadrant of left female breast: Secondary | ICD-10-CM

## 2014-03-01 DIAGNOSIS — C50812 Malignant neoplasm of overlapping sites of left female breast: Secondary | ICD-10-CM

## 2014-03-01 LAB — CBC WITH DIFFERENTIAL/PLATELET
BASO%: 0.3 % (ref 0.0–2.0)
Basophils Absolute: 0 10*3/uL (ref 0.0–0.1)
EOS%: 2.6 % (ref 0.0–7.0)
Eosinophils Absolute: 0.2 10*3/uL (ref 0.0–0.5)
HCT: 38.1 % (ref 34.8–46.6)
HEMOGLOBIN: 12.9 g/dL (ref 11.6–15.9)
LYMPH%: 18.2 % (ref 14.0–49.7)
MCH: 29.9 pg (ref 25.1–34.0)
MCHC: 33.9 g/dL (ref 31.5–36.0)
MCV: 88.2 fL (ref 79.5–101.0)
MONO#: 0.8 10*3/uL (ref 0.1–0.9)
MONO%: 12.3 % (ref 0.0–14.0)
NEUT#: 4.1 10*3/uL (ref 1.5–6.5)
NEUT%: 66.6 % (ref 38.4–76.8)
Platelets: 188 10*3/uL (ref 145–400)
RBC: 4.32 10*6/uL (ref 3.70–5.45)
RDW: 12.8 % (ref 11.2–14.5)
WBC: 6.1 10*3/uL (ref 3.9–10.3)
lymph#: 1.1 10*3/uL (ref 0.9–3.3)

## 2014-03-01 LAB — COMPREHENSIVE METABOLIC PANEL (CC13)
ALK PHOS: 86 U/L (ref 40–150)
ALT: 9 U/L (ref 0–55)
AST: 16 U/L (ref 5–34)
Albumin: 3.5 g/dL (ref 3.5–5.0)
Anion Gap: 8 mEq/L (ref 3–11)
BUN: 10.6 mg/dL (ref 7.0–26.0)
CHLORIDE: 108 meq/L (ref 98–109)
CO2: 25 mEq/L (ref 22–29)
CREATININE: 0.7 mg/dL (ref 0.6–1.1)
Calcium: 9.6 mg/dL (ref 8.4–10.4)
EGFR: 88 mL/min/{1.73_m2} — ABNORMAL LOW (ref >90)
Glucose: 97 mg/dl (ref 70–140)
POTASSIUM: 3.5 meq/L (ref 3.5–5.1)
Sodium: 141 mEq/L (ref 136–145)
Total Bilirubin: 0.68 mg/dL (ref 0.20–1.20)
Total Protein: 6.6 g/dL (ref 6.4–8.3)

## 2014-03-08 ENCOUNTER — Telehealth: Payer: Self-pay | Admitting: Oncology

## 2014-03-08 ENCOUNTER — Ambulatory Visit (HOSPITAL_BASED_OUTPATIENT_CLINIC_OR_DEPARTMENT_OTHER): Payer: BLUE CROSS/BLUE SHIELD | Admitting: Oncology

## 2014-03-08 ENCOUNTER — Other Ambulatory Visit: Payer: BC Managed Care – PPO

## 2014-03-08 VITALS — BP 149/69 | HR 69 | Temp 98.3°F | Resp 20 | Ht 64.0 in | Wt 161.5 lb

## 2014-03-08 DIAGNOSIS — C50412 Malignant neoplasm of upper-outer quadrant of left female breast: Secondary | ICD-10-CM

## 2014-03-08 DIAGNOSIS — Z170 Estrogen receptor positive status [ER+]: Secondary | ICD-10-CM

## 2014-03-08 DIAGNOSIS — C50812 Malignant neoplasm of overlapping sites of left female breast: Secondary | ICD-10-CM

## 2014-03-08 NOTE — Progress Notes (Signed)
[facility]  Telephone:([phone]) [phone] Fax:(336) [phone]     ID: Deanna Buck DOB: 11/21/1945  MR#: [id number]  WLS#:[id number]  Patient Care Team: Rowe Clack, MD as PCP - General Daria Pastures, MD as Consulting Physician (Obstetrics and Gynecology) Jarome Matin, MD as Consulting Physician (Dermatology) Chauncey Cruel, MD as Consulting Physician (Oncology) Rolm Bookbinder M.D., Gery Pray, Arnoldo Hooker [place]  CHIEF COMPLAINT: Early stage multifocal breast cancer  CURRENT TREATMENT:adjuvant radiation;  tamoxifen    BREAST CANCER HISTORY: From the original intake note:  [name] had routine yearly screening mammography with tomography 05/27/2013 at the [facility] Center, showing a possible area of distortion in the left breast. Left diagnostic mammography and ultrasonography 06/13/2013 showed the breast density to be category C. In the upper outer quadrant of the left breast there was a mass measuring approximately 1.8 cm. It was not palpable on exam. Ultrasound showed a hypoechoic irregular mass measuring 1.3 cm correlating with the mammographic finding. In addition, a smaller, 1.0 cm mass was detected approximately 3 cm from the index lesion. The left axilla was negative.  Biopsy of both masses in the left breast 06/22/2013 showed ([id number]) morphologically similar invasive mammary carcinoma, grade 1 or 2, estrogen receptor positive at 93%, progesterone receptor positive at 91%, both with strong staining intensity, with an MIB-1 of 11%, and no HER-2 amplification, the signals ratio being 1.03 and the number per cell 1.55.  On 06/29/2013 the patient underwent bilateral breast MRI at [facility]. By MRI the larger left breast mass measured 3.3 cm. The second, smaller mass measured 1.1 cm. Combined the masses measures 3.9 cm maximally. [By mammography, the masses were 2.4 cm apart with a combined diameter of 4.9 cm.]  The patient's subsequent history is as  detailed below  INTERVAL HISTORY: Deanna Buck returns today for followup of her breast cancer accompanied by her husband Deanna Buck. She completed her radiation treatments mid-November, and "within 2 weeks my skin had healed beautifully". She is not exercising on a regular basis, but is doing all her activities of daily living with normal energy. She is tolerating the tamoxifen superbly. If she has any hot flashes, she says, they last only a few seconds. She has had no significant problems with vaginal discharge. She reports no other side effects related to that medication.  REVIEW OF SYSTEMS: A detailed review of systems today was otherwise entirely benign  PAST MEDICAL HISTORY: Past Medical History  Diagnosis Date  . ECZEMA   . ANXIETY   . GERD   . OA (osteoarthritis)   . Abnormal mammogram 05/2013    L breast  . Full dentures   . Wears contact lenses     also wears glasses  . Breast cancer     left breast  . Radiation 10/24/13-11/30/13    left chest wall and axilla    PAST SURGICAL HISTORY: Past Surgical History  Procedure Laterality Date  . 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 Breast lumpectomy  1982    rt and lt benign  . Breast reconstruction with placement of tissue expander and flex hd (acellular hydrated dermis) Left 08/04/2013    Procedure: BREAST RECONSTRUCTION WITH PLACEMENT OF TISSUE EXPANDER AND  POSSIBLE FLEX HD (ACELLULAR HYDRATED DERMIS);  Surgeon: [name], MD;  Location: [facility];  Service: Plastics;  Laterality: Left;  . Incision and drainage of wound Left 08/30/2013  Procedure: DEBRIDEMENT OF LEFT BREAST;  Surgeon: [name], MD;  Location: [facility];  Service: Plastics;  Laterality: Left;    FAMILY HISTORY Family History  Problem Relation Age of Onset  . Breast cancer Mother   . Dementia Mother   . Arthritis Other   . Hypertension Other     parent  .  Hyperlipidemia Other     parent  . Kidney disease Other     parent   the patient's father died at the age of 20 with congestive heart failure. The patient's mother died at the age of 38. She was diagnosed with breast cancer at the age of 29. The only other breast cancer in the family was on the opposite, father's side, a first cousin diagnosed in her 51s. The patient had one brother who died from a myocardial infarction. She had no sisters.  GYNECOLOGIC HISTORY:  No LMP recorded. Patient is postmenopausal. Menarche age 79, first live birth age 31, the patient is Deanna Buck P2. She underwent menopause approximately 2002. She did not take hormone replacement. She did take birth control pills remotely for approximately 13 years, with no complications.  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 Her children from a prior marriage are Deanna Buck, who is an Optometrist, and Deanna Buck, who is also an Optometrist. Both live in [place]. The patient's husband Deanna Buck work for [employer]. The patient has 2 grandchildren. She is not a Ambulance person.    ADVANCED DIRECTIVES: In place   HEALTH MAINTENANCE: History  Substance Use Topics  . Smoking status: Never Smoker   . Smokeless tobacco: Not on file     Comment: Married, but in stressful relationship with spouse  . Alcohol Use: Yes     Comment: rare glass wine     Colonoscopy:  PAP:  Bone density: 03/31/2011 ad lib. are showing significant osteopenia, with the lowest T score at -2.4  Lipid panel:  Allergies  Allergen Reactions  . Morphine And Related Shortness Of Breath  . Sulfonamide Derivatives Hives and Itching  . Banana Other (See Comments)    Pain  . Penicillins Other (See Comments)    Dr. Sherolyn Buba told her she was allergic to PCN.    Current Outpatient Prescriptions  Medication Sig Dispense Refill  . emollient (BIAFINE) cream Apply topically as needed.    Marland Kitchen esomeprazole (NEXIUM) 40 MG capsule Take 40 mg  by mouth as needed.     . hyaluronate sodium (RADIAPLEXRX) GEL Apply 1 application topically as needed. Tube given 11/23/13    . hydrochlorothiazide (HYDRODIURIL) 25 MG tablet TAKE 1 TABLET BY MOUTH DAILY 90 tablet 3  . lidocaine-prilocaine (EMLA) cream as needed. Apply to left chest prior to expansion 30 min    . nadolol (CORGARD) 40 MG tablet TAKE ONE-HALF (1/2) TABLET DAILY 45 tablet 3  . non-metallic deodorant (ALRA) MISC Apply 1 application topically daily as needed.    . tamoxifen (NOLVADEX) 20 MG tablet     . UNABLE TO FIND 174.4 Left Mastectomy  L8015-Post-Mastectomy Garment-2 1 each 0  . valACYclovir (VALTREX) 1000 MG tablet Take 1 tablet (1,000 mg total) by mouth daily as needed. (Patient not taking: Reported on 01/26/2014) 30 tablet 0  . vitamin B-12 (CYANOCOBALAMIN) 100 MCG tablet Take 50 mcg by mouth as needed.      No current facility-administered medications for this visit.    OBJECTIVE:  Middle-aged white woman who appears stated age 61 Vitals:  03/08/14 1140  BP: 149/69  Pulse: 69  Temp: 98.3 F (36.8 C)  Resp: 20     Body mass index is 27.71 kg/(m^2).    ECOG FS:0 - Asymptomatic  Sclerae unicteric, EOMs intact Oropharynx clear and moist No cervical or supraclavicular adenopathy Lungs no rales or rhonchi Heart regular rate and rhythm Abd soft, nontender, positive bowel sounds MSK no focal spinal tenderness, no upper extremity lymphedema Neuro: nonfocal, well oriented, positive affect Breasts: The right breast is unremarkable. The left breast is status post mastectomy with implant placement. There is significant capsule and firmness. There is minimal residual erythema over the radiation port. There is no skin breakdown. There is no evidence of local recurrence. The left axilla is benign.  LAB RESULTS:  CMP     Component Value Date/Time   NA 141 03/01/2014 1043   NA 140 08/01/2013 1210   K 3.5 03/01/2014 1043   K 4.2 08/01/2013 1210   CL 103 08/01/2013  1210   CO2 25 03/01/2014 1043   CO2 27 08/01/2013 1210   GLUCOSE 97 03/01/2014 1043   GLUCOSE 95 08/01/2013 1210   BUN 10.6 03/01/2014 1043   BUN 17 08/01/2013 1210   CREATININE 0.7 03/01/2014 1043   CREATININE 0.69 08/01/2013 1210   CALCIUM 9.6 03/01/2014 1043   CALCIUM 10.2 08/01/2013 1210   PROT 6.6 03/01/2014 1043   PROT 7.2 06/23/2013 0936   ALBUMIN 3.5 03/01/2014 1043   ALBUMIN 4.0 06/23/2013 0936   AST 16 03/01/2014 1043   AST 17 06/23/2013 0936   ALT 9 03/01/2014 1043   ALT 14 06/23/2013 0936   ALKPHOS 86 03/01/2014 1043   ALKPHOS 95 06/23/2013 0936   BILITOT 0.68 03/01/2014 1043   BILITOT 1.2 06/23/2013 0936   GFRNONAA 88* 08/01/2013 1210   GFRAA >90 08/01/2013 1210    I No results found for: SPEP  Lab Results  Component Value Date   WBC 6.1 03/01/2014   NEUTROABS 4.1 03/01/2014   HGB 12.9 03/01/2014   HCT 38.1 03/01/2014   MCV 88.2 03/01/2014   PLT 188 03/01/2014      Chemistry      Component Value Date/Time   NA 141 03/01/2014 1043   NA 140 08/01/2013 1210   K 3.5 03/01/2014 1043   K 4.2 08/01/2013 1210   CL 103 08/01/2013 1210   CO2 25 03/01/2014 1043   CO2 27 08/01/2013 1210   BUN 10.6 03/01/2014 1043   BUN 17 08/01/2013 1210   CREATININE 0.7 03/01/2014 1043   CREATININE 0.69 08/01/2013 1210      Component Value Date/Time   CALCIUM 9.6 03/01/2014 1043   CALCIUM 10.2 08/01/2013 1210   ALKPHOS 86 03/01/2014 1043   ALKPHOS 95 06/23/2013 0936   AST 16 03/01/2014 1043   AST 17 06/23/2013 0936   ALT 9 03/01/2014 1043   ALT 14 06/23/2013 0936   BILITOT 0.68 03/01/2014 1043   BILITOT 1.2 06/23/2013 0936       No results found for: LABCA2  No components found for: LABCA125  No results for input(s): INR in the last 168 hours.  Urinalysis    Component Value Date/Time   COLORURINE YELLOW 06/23/2013 0936   APPEARANCEUR CLEAR 06/23/2013 0936   LABSPEC 1.020 06/23/2013 0936   PHURINE 7.0 06/23/2013 0936   GLUCOSEU NEGATIVE 06/23/2013  0936   GLUCOSEU NEGATIVE 12/26/2008 1615   HGBUR NEGATIVE 06/23/2013 0936   HGBUR negative 09/11/2009 [place] 06/23/2013 6333  BILIRUBINUR neg 02/17/2011 1625   KETONESUR NEGATIVE 06/23/2013 0936   PROTEINUR neg 02/17/2011 1625   PROTEINUR NEGATIVE 12/26/2008 1615   UROBILINOGEN 0.2 06/23/2013 0936   UROBILINOGEN 0.2 02/17/2011 1625   NITRITE NEGATIVE 06/23/2013 0936   NITRITE pos 02/17/2011 1625   LEUKOCYTESUR NEGATIVE 06/23/2013 0936    STUDIES: No results found.   ASSESSMENT: 69 y.o. [place] woman status post biopsy of 2 separate left breast masses 06/22/2013 for a clinical mT1c N0, stage IA carcinoma, grade 1 or 2, estrogen receptor 93% positive, progesterone receptor 91% positive, with an MIB-1 of 11%, and no HER-2 amplification  (1) status post left mastectomy and sentinel lymph node sampling 08/04/2013 for a pT2 pN1, stage IIB invasive ductal carcinoma, grade 2, with negative margins, and repeat HER-2 again negative  (2) Oncotype score of 13 predicts a risk of recurrence or mortality within 5 years of 10% with tamoxifen alone, and 11% with tamoxifen plus chemotherapy; the patient was offered chemotherapy vs. participation in S007, but decided against both  (3) tamoxifen started 09/22/2013  (4) radiation  completed 11/30/2013  Left chest wall/reconstructed area/axilla -45 gray in 25 fraction  Additional boost to the left chest wall/reconstructed area 5.4 gray for chemotherapy dose of 50.4 gray PLAN: Donabelle has recovered nicely from radiation and is doing very well with tamoxifen. The plan will be to continue that for at least 2 years before considering switching to an aromatase inhibitor.  She will be proceeding to definitive reconstruction mid to late spring. She will have implant placement with some fat remodeling on the left and a reduction mammoplasty on the right, or at least that is the plan should as she understands it now. I have  encouraged her to start an exercise program now, since once she starts the reconstruction process she will be severely limited in terms of her activities for some time.  She is going to see me again in 6 months. We will do lab work and a physical exam before that. She will be scheduled for repeat mammography prior to her reconstructive surgery. She will make sure I get a copy of those results     Moani Weipert C, MD   03/08/2014 11:50 AM

## 2014-03-08 NOTE — Telephone Encounter (Signed)
appts made and avs printed for pt  Deanna Buck °

## 2014-04-19 DIAGNOSIS — Z923 Personal history of irradiation: Secondary | ICD-10-CM | POA: Insufficient documentation

## 2014-04-21 ENCOUNTER — Other Ambulatory Visit: Payer: Self-pay | Admitting: Plastic Surgery

## 2014-04-21 DIAGNOSIS — Z9012 Acquired absence of left breast and nipple: Secondary | ICD-10-CM

## 2014-04-21 DIAGNOSIS — Z853 Personal history of malignant neoplasm of breast: Secondary | ICD-10-CM

## 2014-04-21 DIAGNOSIS — Z1231 Encounter for screening mammogram for malignant neoplasm of breast: Secondary | ICD-10-CM

## 2014-04-26 ENCOUNTER — Ambulatory Visit
Admission: RE | Admit: 2014-04-26 | Discharge: 2014-04-26 | Disposition: A | Discharge status: Home / Self Care | Payer: BLUE CROSS/BLUE SHIELD | Source: Ambulatory Visit | Attending: Plastic Surgery | Admitting: Plastic Surgery

## 2014-04-26 DIAGNOSIS — Z853 Personal history of malignant neoplasm of breast: Secondary | ICD-10-CM

## 2014-04-26 DIAGNOSIS — Z9012 Acquired absence of left breast and nipple: Secondary | ICD-10-CM

## 2014-05-22 NOTE — H&P (Signed)
  Subjective:    Patient ID: Deanna Buck is a 69 y.o. female.  HPI  10 months post op from L NSM with SLN. Course complicated by mastectomy skin flap necrosis lateral to NAC and underwent debridement and exchange TE. Post completion of XRT mid November 2015. Plan for second stage reconstruction with right mastopexy/reduction, left placement of implant and lipofilling. Patient has declined latissimus flap.  Final pathology with 2/4 SLN positive and final 3.6 cm area of IDC. OnTamoxifen. Pt presented on screening MMG with abnomal left breast. Noted to have a 2 separate lesions one being in the 12:00 position 1.8 x1.3 cm, in the second lesion in the 1:00 position. 1.3 x 1.2 cm. Ultrasound-guided biopsy revealed at both locationenvasive mammary carcinoma. ER/PR+, no amplification of HER-2 detected. MRI of the breast confirmed 2 separate lesions. Oncotype score of 13 predicts a risk of recurrence or mortality within 5 years of 10% with tamoxifen alone, and 11% with tamoxifen plus chemotherapy; the patient was offered chemotherapy vs. participation in S007, but decided against both.  Has had bilateral breast excisions in past for benign disease via periareolar incisions. Prior 53 D or DD. Mastectomy weight 474 g  Review of Systems     Objective:   Physical Exam  Cardiovascular: Normal rate and normal heart sounds.  Pulmonary/Chest: Effort normal and breath sounds normal.  Abdominal: Soft.  No hernias    Right breast without masses, grade 2 ptosis SN to nipple R 26 L 20 cm BW R 16 L 14.5 cm Nipple to IMF R 9 L 6 cm R axillary lipoma Left chest with Superior pole contour depression, redundancy of axilla, skin envelope tight and thin over lateral lower pole, increased contraction of skin envelope noted this visit with superior displacement of IMF  Assessment:     Multifocal left breast ca. S/p mastectomy, XRT    Plan:     Plan to proceed with implant exchange, plan  capsulectomy. Discussed role of fat grafting for improving contour breast, may have some benefit on improving soft tissue post XRT though certainly not enough evidence to advocate for fat grafting on this alone. Plan right mastopexy/reduction for symmetry. Discussed variable take fat graft and may desire to repeat this procedure.Counseled given her tight skin envelope on left, this will be out limiting factor on size of breast and counseled I do no feel I will be able to get significant more projection of this breast than present and this will affect the size of right breast reduction. Discussed drain over left breast only, pain abdomen and binder. Patient would like to go home same day if able.  Plan excision right axillary lipoma to aid with contour and symmetry of reconstruction.  Additional risks including but not limited to hematoma, seroma, asymmetry, need for additional surgery, unacceptable cosmetic appearance, DVT/PE, damage to deeper structures, cardiopulmonary complications, implant specific complications including infection, extrusion, wound healing problems, capsular contracture, rupture reviewed. Implant booklet provided.    Pt has elected for round silicone.   Last MMG 04/2014, diagnostic, benign, with breast density C.  Mentor style 9200 expander, 350 ml.  Fill volume 320 ml   [name], MD [facility] [phone]

## 2014-05-25 ENCOUNTER — Encounter (HOSPITAL_BASED_OUTPATIENT_CLINIC_OR_DEPARTMENT_OTHER): Payer: Self-pay | Admitting: *Deleted

## 2014-05-25 NOTE — Progress Notes (Signed)
Coming tomorrow for [employer] . Bring all medications.

## 2014-05-26 ENCOUNTER — Encounter (HOSPITAL_BASED_OUTPATIENT_CLINIC_OR_DEPARTMENT_OTHER)
Admission: RE | Admit: 2014-05-26 | Discharge: 2014-05-26 | Disposition: A | Discharge status: Home / Self Care | Payer: BLUE CROSS/BLUE SHIELD | Source: Ambulatory Visit | Attending: Plastic Surgery | Admitting: Plastic Surgery

## 2014-05-26 DIAGNOSIS — C50912 Malignant neoplasm of unspecified site of left female breast: Secondary | ICD-10-CM | POA: Diagnosis not present

## 2014-05-26 DIAGNOSIS — Z886 Allergy status to analgesic agent status: Secondary | ICD-10-CM | POA: Diagnosis not present

## 2014-05-26 DIAGNOSIS — D171 Benign lipomatous neoplasm of skin and subcutaneous tissue of trunk: Secondary | ICD-10-CM | POA: Diagnosis not present

## 2014-05-26 DIAGNOSIS — N651 Disproportion of reconstructed breast: Secondary | ICD-10-CM | POA: Diagnosis not present

## 2014-05-26 DIAGNOSIS — Z880 Allergy status to penicillin: Secondary | ICD-10-CM | POA: Diagnosis not present

## 2014-05-26 DIAGNOSIS — Z923 Personal history of irradiation: Secondary | ICD-10-CM | POA: Diagnosis not present

## 2014-05-26 DIAGNOSIS — Z9012 Acquired absence of left breast and nipple: Secondary | ICD-10-CM | POA: Diagnosis not present

## 2014-05-26 DIAGNOSIS — Z882 Allergy status to sulfonamides status: Secondary | ICD-10-CM | POA: Diagnosis not present

## 2014-05-26 DIAGNOSIS — I10 Essential (primary) hypertension: Secondary | ICD-10-CM | POA: Diagnosis not present

## 2014-05-26 DIAGNOSIS — Z91018 Allergy to other foods: Secondary | ICD-10-CM | POA: Diagnosis not present

## 2014-05-26 DIAGNOSIS — F419 Anxiety disorder, unspecified: Secondary | ICD-10-CM | POA: Diagnosis not present

## 2014-05-26 DIAGNOSIS — K219 Gastro-esophageal reflux disease without esophagitis: Secondary | ICD-10-CM | POA: Diagnosis not present

## 2014-05-26 LAB — BASIC METABOLIC PANEL
Anion gap: 7 (ref 5–15)
BUN: 11 mg/dL (ref 6–20)
CALCIUM: 9.6 mg/dL (ref 8.9–10.3)
CO2: 27 mmol/L (ref 22–32)
CREATININE: 0.73 mg/dL (ref 0.44–1.00)
Chloride: 107 mmol/L (ref 101–111)
GFR calc Af Amer: >60 mL/min (ref >60)
Glucose, Bld: 92 mg/dL (ref 65–99)
Potassium: 3.7 mmol/L (ref 3.5–5.1)
Sodium: 141 mmol/L (ref 135–145)

## 2014-05-30 ENCOUNTER — Encounter (HOSPITAL_BASED_OUTPATIENT_CLINIC_OR_DEPARTMENT_OTHER): Payer: Self-pay

## 2014-05-30 ENCOUNTER — Ambulatory Visit (HOSPITAL_BASED_OUTPATIENT_CLINIC_OR_DEPARTMENT_OTHER)
Admission: RE | Admit: 2014-05-30 | Discharge: 2014-05-30 | Disposition: A | Discharge status: Home / Self Care | Payer: BLUE CROSS/BLUE SHIELD | Source: Ambulatory Visit | Attending: Plastic Surgery | Admitting: Plastic Surgery

## 2014-05-30 ENCOUNTER — Ambulatory Visit (HOSPITAL_BASED_OUTPATIENT_CLINIC_OR_DEPARTMENT_OTHER): Payer: BLUE CROSS/BLUE SHIELD | Admitting: Anesthesiology

## 2014-05-30 ENCOUNTER — Other Ambulatory Visit: Payer: Self-pay | Admitting: Oncology

## 2014-05-30 ENCOUNTER — Encounter (HOSPITAL_BASED_OUTPATIENT_CLINIC_OR_DEPARTMENT_OTHER)
Admission: RE | Disposition: A | Discharge status: Home / Self Care | Payer: Self-pay | Source: Ambulatory Visit | Attending: Plastic Surgery

## 2014-05-30 DIAGNOSIS — Z882 Allergy status to sulfonamides status: Secondary | ICD-10-CM | POA: Insufficient documentation

## 2014-05-30 DIAGNOSIS — I10 Essential (primary) hypertension: Secondary | ICD-10-CM | POA: Insufficient documentation

## 2014-05-30 DIAGNOSIS — C50912 Malignant neoplasm of unspecified site of left female breast: Secondary | ICD-10-CM | POA: Insufficient documentation

## 2014-05-30 DIAGNOSIS — Z923 Personal history of irradiation: Secondary | ICD-10-CM | POA: Insufficient documentation

## 2014-05-30 DIAGNOSIS — K219 Gastro-esophageal reflux disease without esophagitis: Secondary | ICD-10-CM | POA: Insufficient documentation

## 2014-05-30 DIAGNOSIS — Z91018 Allergy to other foods: Secondary | ICD-10-CM | POA: Insufficient documentation

## 2014-05-30 DIAGNOSIS — Z880 Allergy status to penicillin: Secondary | ICD-10-CM | POA: Insufficient documentation

## 2014-05-30 DIAGNOSIS — Z9012 Acquired absence of left breast and nipple: Secondary | ICD-10-CM | POA: Insufficient documentation

## 2014-05-30 DIAGNOSIS — N651 Disproportion of reconstructed breast: Secondary | ICD-10-CM | POA: Insufficient documentation

## 2014-05-30 DIAGNOSIS — F419 Anxiety disorder, unspecified: Secondary | ICD-10-CM | POA: Insufficient documentation

## 2014-05-30 DIAGNOSIS — D171 Benign lipomatous neoplasm of skin and subcutaneous tissue of trunk: Secondary | ICD-10-CM | POA: Insufficient documentation

## 2014-05-30 DIAGNOSIS — Z886 Allergy status to analgesic agent status: Secondary | ICD-10-CM | POA: Insufficient documentation

## 2014-05-30 HISTORY — PX: LIPOSUCTION WITH LIPOFILLING: SHX6436

## 2014-05-30 HISTORY — PX: MASS EXCISION: SHX2000

## 2014-05-30 HISTORY — PX: MASTOPEXY: SHX5358

## 2014-05-30 HISTORY — DX: Essential (primary) hypertension: I10

## 2014-05-30 HISTORY — PX: REMOVAL OF TISSUE EXPANDER AND PLACEMENT OF IMPLANT: SHX6457

## 2014-05-30 LAB — POCT HEMOGLOBIN-HEMACUE: Hemoglobin: 13.3 g/dL (ref 12.0–15.0)

## 2014-05-30 SURGERY — EXCISION MASS
Anesthesia: General | Site: Chest | Laterality: Right

## 2014-05-30 MED ORDER — LIDOCAINE HCL 1 % IJ SOLN
INTRAVENOUS | Status: DC | PRN
  Administered 2014-05-30: 1000 mL via INTRAMUSCULAR

## 2014-05-30 MED ORDER — CHLORHEXIDINE GLUCONATE 4 % EX LIQD: 1.0000 "application " | Freq: Once | CUTANEOUS | Status: DC

## 2014-05-30 MED ORDER — PROMETHAZINE HCL 25 MG/ML IJ SOLN
6.2500 mg | INTRAMUSCULAR | Status: DC | PRN
  Administered 2014-05-30: 6.25 mg via INTRAVENOUS

## 2014-05-30 MED ORDER — 0.9 % SODIUM CHLORIDE (POUR BTL) OPTIME
TOPICAL | Status: DC | PRN
Start: 2014-05-30 — End: 2014-05-30
  Administered 2014-05-30: 1000 mL

## 2014-05-30 MED ORDER — LIDOCAINE HCL (CARDIAC) 20 MG/ML IV SOLN
INTRAVENOUS | Status: DC | PRN
  Administered 2014-05-30: 50 mg via INTRAVENOUS

## 2014-05-30 MED ORDER — DEXAMETHASONE SODIUM PHOSPHATE 4 MG/ML IJ SOLN
INTRAMUSCULAR | Status: DC | PRN
  Administered 2014-05-30: 10 mg via INTRAVENOUS

## 2014-05-30 MED ORDER — GLYCOPYRROLATE 0.2 MG/ML IJ SOLN: 0.2000 mg | Freq: Once | INTRAMUSCULAR | Status: DC | PRN

## 2014-05-30 MED ORDER — PROMETHAZINE HCL 25 MG/ML IJ SOLN
INTRAMUSCULAR | Status: AC
  Filled 2014-05-30: qty 1

## 2014-05-30 MED ORDER — SUCCINYLCHOLINE CHLORIDE 20 MG/ML IJ SOLN
INTRAMUSCULAR | Status: DC | PRN
  Administered 2014-05-30: 50 mg via INTRAVENOUS

## 2014-05-30 MED ORDER — VANCOMYCIN HCL IN DEXTROSE 1-5 GM/200ML-% IV SOLN
1000.0000 mg | INTRAVENOUS | Status: AC
  Administered 2014-05-30 (×2): 1000 mg via INTRAVENOUS

## 2014-05-30 MED ORDER — METOPROLOL TARTRATE 1 MG/ML IV SOLN
INTRAVENOUS | Status: DC | PRN
  Administered 2014-05-30: 1 mg via INTRAVENOUS

## 2014-05-30 MED ORDER — MIDAZOLAM HCL 2 MG/2ML IJ SOLN
1.0000 mg | INTRAMUSCULAR | Status: DC | PRN
Start: 2014-05-30 — End: 2014-05-30

## 2014-05-30 MED ORDER — HYDROMORPHONE HCL 1 MG/ML IJ SOLN
INTRAMUSCULAR | Status: AC
  Filled 2014-05-30: qty 1

## 2014-05-30 MED ORDER — PROPOFOL 10 MG/ML IV BOLUS
INTRAVENOUS | Status: DC | PRN
  Administered 2014-05-30: 150 mg via INTRAVENOUS

## 2014-05-30 MED ORDER — LIDOCAINE-EPINEPHRINE 1 %-1:100000 IJ SOLN
INTRAMUSCULAR | Status: AC
  Filled 2014-05-30: qty 1

## 2014-05-30 MED ORDER — OXYCODONE HCL 5 MG PO TABS: 5.0000 mg | ORAL_TABLET | Freq: Once | ORAL | Status: DC | PRN

## 2014-05-30 MED ORDER — FENTANYL CITRATE (PF) 100 MCG/2ML IJ SOLN: 50.0000 ug | INTRAMUSCULAR | Status: DC | PRN

## 2014-05-30 MED ORDER — FENTANYL CITRATE (PF) 100 MCG/2ML IJ SOLN
INTRAMUSCULAR | Status: AC
  Filled 2014-05-30: qty 6

## 2014-05-30 MED ORDER — LACTATED RINGERS IV SOLN
INTRAVENOUS | Status: DC
  Administered 2014-05-30 (×2): via INTRAVENOUS

## 2014-05-30 MED ORDER — OXYCODONE HCL 5 MG/5ML PO SOLN: 5.0000 mg | Freq: Once | ORAL | Status: DC | PRN

## 2014-05-30 MED ORDER — LIDOCAINE HCL (PF) 1 % IJ SOLN
INTRAMUSCULAR | Status: AC
  Filled 2014-05-30: qty 60

## 2014-05-30 MED ORDER — SODIUM CHLORIDE 0.9 % IV SOLN
INTRAVENOUS | Status: DC | PRN
  Administered 2014-05-30: 1000 mL

## 2014-05-30 MED ORDER — MIDAZOLAM HCL 5 MG/5ML IJ SOLN
INTRAMUSCULAR | Status: DC | PRN
  Administered 2014-05-30: 2 mg via INTRAVENOUS

## 2014-05-30 MED ORDER — GLYCOPYRROLATE 0.2 MG/ML IJ SOLN
INTRAMUSCULAR | Status: DC | PRN
  Administered 2014-05-30 (×2): 0.2 mg via INTRAVENOUS

## 2014-05-30 MED ORDER — FENTANYL CITRATE (PF) 100 MCG/2ML IJ SOLN
INTRAMUSCULAR | Status: DC | PRN
  Administered 2014-05-30 (×8): 50 ug via INTRAVENOUS

## 2014-05-30 MED ORDER — HYDROCODONE-ACETAMINOPHEN 5-325 MG PO TABS: 1.0000 | ORAL_TABLET | Freq: Four times a day (QID) | ORAL | Status: DC | PRN

## 2014-05-30 MED ORDER — SODIUM BICARBONATE 4 % IV SOLN
INTRAVENOUS | Status: AC
  Filled 2014-05-30: qty 10

## 2014-05-30 MED ORDER — VANCOMYCIN HCL IN DEXTROSE 1-5 GM/200ML-% IV SOLN
INTRAVENOUS | Status: AC
  Filled 2014-05-30: qty 200

## 2014-05-30 MED ORDER — ONDANSETRON HCL 4 MG/2ML IJ SOLN
INTRAMUSCULAR | Status: DC | PRN
Start: 2014-05-30 — End: 2014-05-30
  Administered 2014-05-30: 4 mg via INTRAVENOUS

## 2014-05-30 MED ORDER — MIDAZOLAM HCL 2 MG/2ML IJ SOLN
INTRAMUSCULAR | Status: AC
  Filled 2014-05-30: qty 2

## 2014-05-30 MED ORDER — CIPROFLOXACIN HCL 500 MG PO TABS: 500.0000 mg | ORAL_TABLET | Freq: Two times a day (BID) | ORAL | Status: DC

## 2014-05-30 MED ORDER — SUCCINYLCHOLINE CHLORIDE 20 MG/ML IJ SOLN
INTRAMUSCULAR | Status: AC
  Filled 2014-05-30: qty 2

## 2014-05-30 MED ORDER — BACITRACIN ZINC 500 UNIT/GM EX OINT
TOPICAL_OINTMENT | CUTANEOUS | Status: AC
Start: 2014-05-30 — End: 2014-05-30
  Filled 2014-05-30: qty 2.7

## 2014-05-30 MED ORDER — HYDROMORPHONE HCL 1 MG/ML IJ SOLN
0.2500 mg | INTRAMUSCULAR | Status: DC | PRN
  Administered 2014-05-30: 0.5 mg via INTRAVENOUS
  Administered 2014-05-30: 0.25 mg via INTRAVENOUS

## 2014-05-30 MED ORDER — DIAZEPAM 5 MG PO TABS: 5.0000 mg | ORAL_TABLET | Freq: Three times a day (TID) | ORAL | Status: DC | PRN

## 2014-05-30 MED ORDER — EPINEPHRINE HCL 1 MG/ML IJ SOLN
INTRAMUSCULAR | Status: AC
  Filled 2014-05-30: qty 1

## 2014-05-30 SURGICAL SUPPLY — 118 items
APL SKNCLS STERI-STRIP NONHPOA (GAUZE/BANDAGES/DRESSINGS)
BAG DECANTER FOR FLEXI CONT (MISCELLANEOUS) ×5 IMPLANT
BENZOIN TINCTURE PRP APPL 2/3 (GAUZE/BANDAGES/DRESSINGS) IMPLANT
BINDER ABDOMINAL 10 UNV 27-48 (MISCELLANEOUS) ×1 IMPLANT
BINDER ABDOMINAL 12 SM 30-45 (SOFTGOODS) IMPLANT
BINDER BREAST LRG (GAUZE/BANDAGES/DRESSINGS) IMPLANT
BINDER BREAST MEDIUM (GAUZE/BANDAGES/DRESSINGS) IMPLANT
BINDER BREAST XLRG (GAUZE/BANDAGES/DRESSINGS) ×1 IMPLANT
BINDER BREAST XXLRG (GAUZE/BANDAGES/DRESSINGS) IMPLANT
BIOPATCH RED 1 DISK 7.0 (GAUZE/BANDAGES/DRESSINGS) IMPLANT
BLADE CLIPPER SURG (BLADE) IMPLANT
BLADE SURG 10 STRL SS (BLADE) ×5 IMPLANT
BLADE SURG 11 STRL SS (BLADE) ×5 IMPLANT
BLADE SURG 15 STRL LF DISP TIS (BLADE) ×4 IMPLANT
BLADE SURG 15 STRL SS (BLADE) ×5
BNDG GAUZE ELAST 4 BULKY (GAUZE/BANDAGES/DRESSINGS) ×10 IMPLANT
CANISTER LIPO FAT HARVEST (MISCELLANEOUS) ×5 IMPLANT
CANISTER SUCT 1200ML W/VALVE (MISCELLANEOUS) ×5 IMPLANT
CHLORAPREP W/TINT 26ML (MISCELLANEOUS) ×6 IMPLANT
COVER BACK TABLE 60X90IN (DRAPES) ×5 IMPLANT
COVER MAYO STAND STRL (DRAPES) ×5 IMPLANT
DECANTER SPIKE VIAL GLASS SM (MISCELLANEOUS) IMPLANT
DRAIN CHANNEL 15F RND FF W/TCR (WOUND CARE) ×5 IMPLANT
DRAIN CHANNEL 19F RND (DRAIN) IMPLANT
DRAIN JP 10F RND SILICONE (MISCELLANEOUS) IMPLANT
DRAPE LAPAROSCOPIC ABDOMINAL (DRAPES) ×5 IMPLANT
DRAPE LAPAROTOMY 100X72 PEDS (DRAPES) IMPLANT
DRAPE U-SHAPE 76X120 STRL (DRAPES) ×5 IMPLANT
DRSG PAD ABDOMINAL 8X10 ST (GAUZE/BANDAGES/DRESSINGS) ×12 IMPLANT
DRSG TEGADERM 2-3/8X2-3/4 SM (GAUZE/BANDAGES/DRESSINGS) ×5 IMPLANT
ELECT BLADE 4.0 EZ CLEAN MEGAD (MISCELLANEOUS) ×5
ELECT COATED BLADE 2.86 ST (ELECTRODE) ×5 IMPLANT
ELECT NDL BLADE 2-5/6 (NEEDLE) ×4 IMPLANT
ELECT NEEDLE BLADE 2-5/6 (NEEDLE) ×5 IMPLANT
ELECT REM PT RETURN 9FT ADLT (ELECTROSURGICAL) ×5
ELECT REM PT RETURN 9FT PED (ELECTROSURGICAL)
ELECTRODE BLDE 4.0 EZ CLN MEGD (MISCELLANEOUS) ×4 IMPLANT
ELECTRODE REM PT RETRN 9FT PED (ELECTROSURGICAL) IMPLANT
ELECTRODE REM PT RTRN 9FT ADLT (ELECTROSURGICAL) ×4 IMPLANT
EVACUATOR SILICONE 100CC (DRAIN) ×5 IMPLANT
FILTER LIPOSUCTION (MISCELLANEOUS) ×5 IMPLANT
GAUZE SPONGE 4X4 12PLY STRL (GAUZE/BANDAGES/DRESSINGS) IMPLANT
GAUZE XEROFORM 1X8 LF (GAUZE/BANDAGES/DRESSINGS) ×2 IMPLANT
GLOVE BIO SURGEON STRL SZ 6 (GLOVE) ×10 IMPLANT
GLOVE BIO SURGEON STRL SZ 6.5 (GLOVE) ×4 IMPLANT
GLOVE BIOGEL PI IND STRL 7.0 (GLOVE) IMPLANT
GLOVE BIOGEL PI INDICATOR 7.0 (GLOVE) ×2
GLOVE ECLIPSE 6.5 STRL STRAW (GLOVE) ×3 IMPLANT
GOWN STRL REUS W/ TWL LRG LVL3 (GOWN DISPOSABLE) ×8 IMPLANT
GOWN STRL REUS W/TWL LRG LVL3 (GOWN DISPOSABLE) ×10
GRAFT FLEX HD 4X16 THICK (Tissue Mesh) ×1 IMPLANT
IMPLANT SILICONE HP TEXT 475CC ×1 IMPLANT
IV NS 500ML (IV SOLUTION) ×5
IV NS 500ML BAXH (IV SOLUTION) ×4 IMPLANT
KIT FILL SYSTEM UNIVERSAL (SET/KITS/TRAYS/PACK) IMPLANT
LINER CANISTER 1000CC FLEX (MISCELLANEOUS) ×5 IMPLANT
LIQUID BAND (GAUZE/BANDAGES/DRESSINGS) ×7 IMPLANT
MANIFOLD NEPTUNE II (INSTRUMENTS) IMPLANT
NDL HYPO 25X1 1.5 SAFETY (NEEDLE) IMPLANT
NDL HYPO 30GX1 BEV (NEEDLE) IMPLANT
NDL PRECISIONGLIDE 27X1.5 (NEEDLE) ×4 IMPLANT
NDL SAFETY ECLIPSE 18X1.5 (NEEDLE) ×4 IMPLANT
NEEDLE HYPO 18GX1.5 SHARP (NEEDLE) ×5
NEEDLE HYPO 25X1 1.5 SAFETY (NEEDLE) IMPLANT
NEEDLE HYPO 30GX1 BEV (NEEDLE) IMPLANT
NEEDLE PRECISIONGLIDE 27X1.5 (NEEDLE) ×5 IMPLANT
NS IRRIG 1000ML POUR BTL (IV SOLUTION) ×5 IMPLANT
PACK BASIN DAY SURGERY FS (CUSTOM PROCEDURE TRAY) ×5 IMPLANT
PACK UNIVERSAL I (CUSTOM PROCEDURE TRAY) ×5 IMPLANT
PAD ALCOHOL SWAB (MISCELLANEOUS) ×5 IMPLANT
PENCIL BUTTON HOLSTER BLD 10FT (ELECTRODE) ×5 IMPLANT
PIN SAFETY STERILE (MISCELLANEOUS) ×5 IMPLANT
RUBBERBAND STERILE (MISCELLANEOUS) IMPLANT
SHEET MEDIUM DRAPE 40X70 STRL (DRAPES) ×7 IMPLANT
SIZER BREAST GEL REUSE 475CC (SIZER) ×5
SIZER BRST GEL REUSE 475CC (SIZER) IMPLANT
SIZER GENERIC MENTOR (SIZER) ×1 IMPLANT
SLEEVE SCD COMPRESS KNEE MED (MISCELLANEOUS) ×5 IMPLANT
SPONGE GAUZE 2X2 8PLY STRL LF (GAUZE/BANDAGES/DRESSINGS) IMPLANT
SPONGE GAUZE 4X4 12PLY STER LF (GAUZE/BANDAGES/DRESSINGS) IMPLANT
SPONGE LAP 18X18 X RAY DECT (DISPOSABLE) ×12 IMPLANT
STAPLER VISISTAT 35W (STAPLE) ×6 IMPLANT
STRIP CLOSURE SKIN 1/2X4 (GAUZE/BANDAGES/DRESSINGS) IMPLANT
SUCTION FRAZIER TIP 10 FR DISP (SUCTIONS) IMPLANT
SUT ETHILON 2 0 FS 18 (SUTURE) ×5 IMPLANT
SUT ETHILON 4 0 PS 2 18 (SUTURE) ×1 IMPLANT
SUT MNCRL AB 4-0 PS2 18 (SUTURE) ×7 IMPLANT
SUT MON AB 3-0 SH 27 (SUTURE)
SUT MON AB 3-0 SH27 (SUTURE) IMPLANT
SUT MON AB 5-0 P3 18 (SUTURE) IMPLANT
SUT MON AB 5-0 PS2 18 (SUTURE) IMPLANT
SUT PDS 3-0 CT2 (SUTURE)
SUT PDS AB 2-0 CT2 27 (SUTURE) IMPLANT
SUT PDS II 3-0 CT2 27 ABS (SUTURE) IMPLANT
SUT PLAIN 5 0 P 3 18 (SUTURE) IMPLANT
SUT PROLENE 2 0 CT2 30 (SUTURE) IMPLANT
SUT PROLENE 5 0 P 3 (SUTURE) IMPLANT
SUT PROLENE 6 0 P 1 18 (SUTURE) IMPLANT
SUT SILK 3 0 PS 1 (SUTURE) IMPLANT
SUT VIC AB 3-0 PS1 18 (SUTURE) ×10
SUT VIC AB 3-0 PS1 18XBRD (SUTURE) IMPLANT
SUT VIC AB 3-0 SH 27 (SUTURE) ×15
SUT VIC AB 3-0 SH 27X BRD (SUTURE) ×4 IMPLANT
SUT VICRYL 4-0 PS2 18IN ABS (SUTURE) ×6 IMPLANT
SYR 20CC LL (SYRINGE) IMPLANT
SYR 50ML LL SCALE MARK (SYRINGE) ×12 IMPLANT
SYR BULB 3OZ (MISCELLANEOUS) IMPLANT
SYR BULB IRRIGATION 50ML (SYRINGE) ×5 IMPLANT
SYR CONTROL 10ML LL (SYRINGE) ×15 IMPLANT
SYR TB 1ML LL NO SAFETY (SYRINGE) ×5 IMPLANT
SYRINGE 10CC LL (SYRINGE) ×15 IMPLANT
TOWEL OR 17X24 6PK STRL BLUE (TOWEL DISPOSABLE) ×10 IMPLANT
TRAY DSU PREP LF (CUSTOM PROCEDURE TRAY) IMPLANT
TUBE CONNECTING 20X1/4 (TUBING) ×5 IMPLANT
TUBING INFILTRATION IT-10001 (TUBING) IMPLANT
TUBING SET GRADUATE ASPIR 12FT (MISCELLANEOUS) ×5 IMPLANT
UNDERPAD 30X30 (UNDERPADS AND DIAPERS) ×10 IMPLANT
YANKAUER SUCT BULB TIP NO VENT (SUCTIONS) ×5 IMPLANT

## 2014-05-30 NOTE — Interval H&P Note (Signed)
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 After consideration of risks, benefits and other options for treatment, the patient has consented to  Procedure(s): EXCISION OF RIGHT AXILLARY LIPOMA (Right) REMOVAL OF LEFT TISSUE EXPANDER AND PLACEMENT OF IMPLANT (Left) LIPOFILLING TO LEFT CHEST (Left) RIGHT MASTOPEXY FOR SYMMETRY (Right) as a surgical intervention .  The patient's history has been reviewed, patient examined, no change in status, stable for surgery.  I have reviewed the patient's chart and labs.  Questions were answered to the patient's satisfaction.     Chera Slivka

## 2014-05-30 NOTE — Discharge Instructions (Signed)

## 2014-05-30 NOTE — Op Note (Signed)
Operative Note   DATE OF OPERATION: 5.17.2016  LOCATION: [facility]-outpatient  SURGICAL DIVISION: Plastic Surgery  PREOPERATIVE DIAGNOSES:  1. History of left breast cancer 2. Acquired absence left breast 3. Asymmetry native and reconstructed breasts. 4. History radiation to left chest 5. Right axillary lipoma  POSTOPERATIVE DIAGNOSES:  same  PROCEDURE:  1. Removal left tissue expander and placement silicone implant 2. Lipofilling to left chest 3. Acellular dermis for breast reconstruction left, total 50 cm2 4. Right mastopexy for symmetry 5. Excision right axillary lipoma 6 cm  SURGEON: [name] MD MBA  ASSISTANT: none  ANESTHESIA:  General.   EBL: 631 ml  COMPLICATIONS: None immediate.   INDICATIONS FOR PROCEDURE:  The patient, Deanna Buck, is a 69 y.o. female born on [birth year], is here for second stage breast reconstruction following nipple sparing mastectomy. She suffered mastectomy flap necrosis and required debridement including removal of portions acellular dermis. She presents following completion radiation therapy 11/2013.    FINDINGS: Mentor Siltex High Profile 475 ml implant placed. Ref [id number]. Approximately 75 ml fat infiltrated over left reconstructed breast. Right mastopexy tissue 81 g.   DESCRIPTION OF PROCEDURE:  The patient was marked in the standing position in the preoperative area to mark chest midline, sternal notch, anterior axillary lines, breast meridians, and desired areas for fat harvest over infraumbilical abdomen and bilateral flanks. The location for new location of nipple areolar complex on right was marked symmetric with left. With aid of Wise pattern marker the vertical limbs for resection were marked, also symmetric with nipple to inframammary fold distance on left. The patient was taken to the operating room. SCDs were placed and IV antibiotics were given. The patient's operative site was prepped and draped in a  sterile fashion. A time out was performed and all information was confirmed to be correct. Stab incisions placed over right and left abdomen and tumescent infiltrated over bilateral flanks and infraumbilical abdomen. Total 550 ml tumescent infiltrated. I then entered the left breast cavity through prion inframammary scar. Incision carried through to capsule. Expander removed. Examination of capsule showed well incorporated acellular dermis medial lower pole, absence of acellular dermis lateral lower pole of mastectomy flap with thin mastectomy flaps in this area, minimal appreciable subcutaneous fat. Capsulotomies performed superiorly, laterally, and inferiorly to lower the IMF to be symmetric with right breast. Sizer placed in cavity and tailor tacked closed.   Power assisted liposuction then completed over abdomen and flanks, total aspirate 500 ml. Stab incisions approximated with 4-0 monocryl simple suture. Harvested fat washed and prepared by gravity.  Incision made in natural skin tension line over right axillary mass. Skin flaps elevated over benign appearing fatty tissue. Lipoma excised, diameter 6 cm. Wound irrigated and hemostasis obtained. Closure completed with 3-0 vicryl in superficial fascia, 4-0 vicryl in dermis and 4-0 monocryl subcuticular for skin closure.  45 mm cookie cutter used to mark right nipple areola. Remainder of superomedial pedicle de-epithelialized. Pedicle developed to allow for tension free rotation superiorly. Skin and breast tissue in areas marked preoperatively excised. Patient then brought to sitting position and remainder of resection marked by tailor tacking. Patient returned to supine position and areas marked by tailor tacking excised, final scar inverted T. Wound irrigated and hemostasis obtained. Closure completed with 3-0 vicryl in dermis along IMF and 4-0 vicryl along vertical limb. NAC inset with 4-0 vicryl in dermis. Skin closure completed with 4-0 monocryl  subcuticular throughout.   I then returned to left breast.  Fat infiltrated in muscular and subcutaneous planes. In areas with minimal subcutaneous tissue, fat threaded through space between capsule and skin. Additional stab incisions required over left mastectomy flaps to allow for total skin envelope grafting.  Approximately 75 ml infiltrated. Capsule irrigated with solution containing Ancef, gentamicin, and bacitracin. Hemostasis obtained. Acellular dermis perforated and placed in area of thin flaps corresponding to area of lateral and inferior to NAC that required debridement following mastectomy. This was secured to capsule with 3-0 vicryl. JP drain placed percutaneously in cavity and secured to skin with 2-0 nylon. Implant then placed in cavity. Closure completed with 3-0 vicryl in superficial fascia and capsule,4-0 vicryl in dermis and 4-0 monocryl subcuticular for skin closure. Tissue adhesive applied to all breast incisions.  Dry dressing, breast binder and abdominal binder placed.   The patient was allowed to wake from anesthesia, extubated and taken to the recovery room in satisfactory condition.   SPECIMENS: 1. Right axillary lipoma 2. Right breast tissue  DRAINS: 52 Fr JP in left reconstructed breast  [name], MD [facility]astic [facility] Surgery [phone]

## 2014-05-30 NOTE — Transfer of Care (Signed)
Immediate Anesthesia Transfer of Care Note  Patient: Deanna Buck  Procedure(s) Performed: Procedure(s): EXCISION OF RIGHT AXILLARY LIPOMA (Right) REMOVAL OF LEFT TISSUE EXPANDER AND PLACEMENT OF IMPLANT (Left) LIPOFILLING TO LEFT CHEST (Left) RIGHT MASTOPEXY FOR SYMMETRY (Right)  Patient Location: PACU  Anesthesia Type:General  Level of Consciousness: sedated  Airway & Oxygen Therapy: Patient Spontanous Breathing and Patient connected to face mask oxygen  Post-op Assessment: Report given to RN and Post -op Vital signs reviewed and stable  Post vital signs: Reviewed and stable  Last Vitals:  Filed Vitals:   05/30/14 1145  BP: 131/62  Pulse: 84  Temp:   Resp: 18    Complications: No apparent anesthesia complications

## 2014-05-30 NOTE — Anesthesia Procedure Notes (Signed)
Procedure Name: Intubation Date/Time: 05/30/2014 7:39 AM Performed by: Marrianne Mood Pre-anesthesia Checklist: Patient identified, Emergency Drugs available, Suction available, Patient being monitored and Timeout performed Patient Re-evaluated:Patient Re-evaluated prior to inductionOxygen Delivery Method: Circle System Utilized Preoxygenation: Pre-oxygenation with 100% oxygen Intubation Type: IV induction Ventilation: Mask ventilation without difficulty Laryngoscope Size: Miller and 3 Grade View: Grade I Tube type: Oral Tube size: 7.0 mm Number of attempts: 1 Airway Equipment and Method: Stylet and Oral airway Placement Confirmation: ETT inserted through vocal cords under direct vision,  positive ETCO2 and breath sounds checked- equal and bilateral Secured at: 21 cm Tube secured with: Tape Dental Injury: Teeth and Oropharynx as per pre-operative assessment

## 2014-05-30 NOTE — Anesthesia Postprocedure Evaluation (Signed)
  Anesthesia Post-op Note  Patient: Deanna Buck  Procedure(s) Performed: Procedure(s): EXCISION OF RIGHT AXILLARY LIPOMA (Right) REMOVAL OF LEFT TISSUE EXPANDER AND PLACEMENT OF IMPLANT (Left) LIPOFILLING TO LEFT CHEST (Left) RIGHT MASTOPEXY FOR SYMMETRY (Right)  Patient Location: PACU  Anesthesia Type:General  Level of Consciousness: awake and alert   Airway and Oxygen Therapy: Patient Spontanous Breathing  Post-op Pain: none  Post-op Assessment: Post-op Vital signs reviewed  Post-op Vital Signs: Reviewed  Last Vitals:  Filed Vitals:   05/30/14 1345  BP: 108/50  Pulse: 75  Temp:   Resp: 15    Complications: No apparent anesthesia complications

## 2014-05-30 NOTE — Anesthesia Preprocedure Evaluation (Signed)
Anesthesia Evaluation  Patient identified by MRN, date of birth, ID band Patient awake    Reviewed: Allergy & Precautions, H&P , NPO status , Patient's Chart, lab work & pertinent test results, reviewed documented beta blocker date and time   Airway Mallampati: II  TM Distance: >3 FB Neck ROM: Full    Dental  (+) Edentulous Upper, Edentulous Lower   Pulmonary neg pulmonary ROS,  breath sounds clear to auscultation        Cardiovascular hypertension, Pt. on medications and Pt. on home beta blockers Rhythm:Regular Rate:Normal     Neuro/Psych Anxiety negative neurological ROS     GI/Hepatic Neg liver ROS, GERD-  ,  Endo/Other  negative endocrine ROS  Renal/GU negative Renal ROS     Musculoskeletal   Abdominal   Peds  Hematology negative hematology ROS (+)   Anesthesia Other Findings   Reproductive/Obstetrics                             Anesthesia Physical  Anesthesia Plan  ASA: II  Anesthesia Plan: General   Post-op Pain Management:    Induction: Intravenous  Airway Management Planned: Oral ETT and LMA  Additional Equipment:   Intra-op Plan:   Post-operative Plan:   Informed Consent: I have reviewed the patients History and Physical, chart, labs and discussed the procedure including the risks, benefits and alternatives for the proposed anesthesia with the patient or authorized representative who has indicated his/her understanding and acceptance.     Plan Discussed with: CRNA and Anesthesiologist  Anesthesia Plan Comments:         Anesthesia Quick Evaluation

## 2014-05-31 ENCOUNTER — Encounter (HOSPITAL_BASED_OUTPATIENT_CLINIC_OR_DEPARTMENT_OTHER): Payer: Self-pay | Admitting: Plastic Surgery

## 2014-06-13 ENCOUNTER — Encounter: Payer: Self-pay | Admitting: Oncology

## 2014-06-13 ENCOUNTER — Other Ambulatory Visit: Payer: Self-pay | Admitting: Oncology

## 2014-06-26 ENCOUNTER — Encounter: Payer: BC Managed Care – PPO | Admitting: Internal Medicine

## 2014-07-03 ENCOUNTER — Telehealth: Payer: Self-pay | Admitting: Internal Medicine

## 2014-07-03 MED ORDER — HYDROCHLOROTHIAZIDE 25 MG PO TABS: 25.0000 mg | ORAL_TABLET | Freq: Every day | ORAL | Status: DC

## 2014-07-03 NOTE — Telephone Encounter (Signed)
Verified pharmacy is [facility] on [address]. CPE schedule for July... Patient is in need of refill of hydrochlorothiazide (HYDRODIURIL) 25 MG tablet [[id number]] in the meantime.

## 2014-07-10 ENCOUNTER — Other Ambulatory Visit: Payer: Self-pay

## 2014-07-20 ENCOUNTER — Ambulatory Visit
Admission: RE | Admit: 2014-07-20 | Discharge: 2014-07-20 | Disposition: A | Discharge status: Home / Self Care | Payer: BLUE CROSS/BLUE SHIELD | Source: Ambulatory Visit | Attending: Radiation Oncology | Admitting: Radiation Oncology

## 2014-07-20 ENCOUNTER — Encounter: Payer: Self-pay | Admitting: Radiation Oncology

## 2014-07-20 VITALS — BP 135/55 | HR 68 | Temp 98.6°F | Resp 12 | Ht 64.0 in | Wt 158.7 lb

## 2014-07-20 DIAGNOSIS — C50412 Malignant neoplasm of upper-outer quadrant of left female breast: Secondary | ICD-10-CM

## 2014-07-20 NOTE — Progress Notes (Signed)
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 She is doing well. She denies pain, swelling, numbness in her arms, or cough. She reports her energy level is improving. She does get tired in the afternoon. She had surgery on 05/30/14 to her left breast with fat grafting and right breast reduction. She reports she had redness on her right breast, she says this redness comes and goes. The skin on her left breast is intact. She may have more plastic surgery in September. She plans to take a trip to beach in the fall if another surgery is not needed. She had a mammogram before reduction surgery to her right breast. She has experienced some itching to her right breast. She will see Dr. Jana Hakim August 25th and continues blood work checks with him. She will be following up with Dr. Iran Planas August 30th.                              ALLERGIES:  is allergic to morphine and related; sulfonamide derivatives; banana; and penicillins.  Meds: Current Outpatient Prescriptions  Medication Sig Dispense Refill  . acetaminophen (TYLENOL) 650 MG CR tablet Take 650 mg by mouth every 8 (eight) hours as needed for pain.    . ciprofloxacin (CIPRO) 500 MG tablet Take 1 tablet (500 mg total) by mouth 2 (two) times daily. 10 tablet 0  . diazepam (VALIUM) 5 MG tablet Take 1 tablet (5 mg total) by mouth every 8 (eight) hours as needed for anxiety. 30 tablet 0  . esomeprazole (NEXIUM) 40 MG capsule Take 40 mg by mouth as needed.     . hydrochlorothiazide (HYDRODIURIL) 25 MG tablet Take 1 tablet (25 mg total) by mouth daily. 90 tablet 3  . HYDROcodone-acetaminophen  (NORCO) 5-325 MG per tablet Take 1-2 tablets by mouth every 6 (six) hours as needed for moderate pain. 50 tablet 0  . nadolol (CORGARD) 40 MG tablet TAKE ONE-HALF (1/2) TABLET DAILY 45 tablet 3  . non-metallic deodorant (ALRA) MISC Apply 1 application topically daily as needed.    . tamoxifen (NOLVADEX) 20 MG tablet     . UNABLE TO FIND 174.4 Left Mastectomy  L8015-Post-Mastectomy Garment-2 1 each 0  . valACYclovir (VALTREX) 1000 MG tablet Take 1 tablet (1,000 mg total) by mouth daily as needed. 30 tablet 0  . vitamin B-12 (CYANOCOBALAMIN) 100 MCG tablet Take 50 mcg by mouth as needed.      No current facility-administered medications for this encounter.    Physical Findings: The patient is in no acute distress. Patient is alert and oriented.  vitals were not taken for this visit.Marland Kitchen  No palpable supraclavicular or axillary adenopathy. Lungs are clear to auscultation. The heart has a regular rhythm and rate. Examination of the left chest area reveals a reconstructed area. Mild radiation changes noted. No palpable or visible signs of reoccurrence. The patient's skin is well healed at this time without signs of infection or breakdown.  Right side shows a reduction mammoplasty. Slight erythema to the scars, no signs of infection.   Lab Findings: Lab  Results  Component Value Date   WBC 6.1 03/01/2014   HGB 13.3 05/30/2014   HCT 38.1 03/01/2014   MCV 88.2 03/01/2014   PLT 188 03/01/2014    Radiographic Findings: No results found.  Impression:  The patient is recovering from the effects of radiation.  No evidence of recurrence on clinical exam today  Plan:  Call or return to clinic prn if these symptoms worsen or fail to improve as anticipated. She will continue to have close follow up with medical oncology and plastic surgery.   This document serves as a record of services personally performed by Gery Pray, MD. It was created on his behalf by Arlyce Harman, a trained medical  scribe. The creation of this record is based on the scribe's personal observations and the provider's statements to them. This document has been checked and approved by the attending provider. -----------------------------------  Blair Promise, PhD, MD

## 2014-07-20 NOTE — Progress Notes (Addendum)
Deanna Buck here for follow up.  She denies pain.  She reports her energy level is improving.  She does get tired in the afternoon.  She is taking tamoxifen.  She had surgery on 05/30/14 to her left breast with fat grafting and right breast reduction.  She reports she had redness on her right breast that has been "on and off since last week."  The skin on her left breast is intact.  She may have more plastic surgery in September.  BP 135/55 mmHg  Pulse 68  Temp(Src) 98.6 F (37 C) (Oral)  Resp 12  Ht 5\' 4"  (1.626 m)  Wt 158 lb 11.2 oz (71.986 kg)  BMI 27.23 kg/m2

## 2014-08-02 ENCOUNTER — Other Ambulatory Visit: Payer: Self-pay | Admitting: Internal Medicine

## 2014-08-03 ENCOUNTER — Ambulatory Visit (INDEPENDENT_AMBULATORY_CARE_PROVIDER_SITE_OTHER): Payer: BLUE CROSS/BLUE SHIELD | Admitting: Internal Medicine

## 2014-08-03 ENCOUNTER — Encounter: Payer: Self-pay | Admitting: Internal Medicine

## 2014-08-03 ENCOUNTER — Other Ambulatory Visit: Payer: Self-pay | Admitting: Internal Medicine

## 2014-08-03 ENCOUNTER — Other Ambulatory Visit (INDEPENDENT_AMBULATORY_CARE_PROVIDER_SITE_OTHER): Payer: BLUE CROSS/BLUE SHIELD

## 2014-08-03 VITALS — BP 130/78 | HR 65 | Temp 97.6°F | Ht 64.0 in | Wt 158.0 lb

## 2014-08-03 DIAGNOSIS — Z0001 Encounter for general adult medical examination with abnormal findings: Secondary | ICD-10-CM | POA: Insufficient documentation

## 2014-08-03 DIAGNOSIS — Z23 Encounter for immunization: Secondary | ICD-10-CM

## 2014-08-03 DIAGNOSIS — Z0000 Encounter for general adult medical examination without abnormal findings: Secondary | ICD-10-CM

## 2014-08-03 LAB — URINALYSIS, ROUTINE W REFLEX MICROSCOPIC
Bilirubin Urine: NEGATIVE
Hgb urine dipstick: NEGATIVE
Ketones, ur: NEGATIVE
Nitrite: NEGATIVE
PH: 6.5 (ref 5.0–8.0)
RBC / HPF: NONE SEEN (ref 0–?)
SPECIFIC GRAVITY, URINE: 1.01 (ref 1.000–1.030)
Total Protein, Urine: NEGATIVE
Urine Glucose: NEGATIVE
Urobilinogen, UA: 0.2 (ref 0.0–1.0)

## 2014-08-03 LAB — CBC WITH DIFFERENTIAL/PLATELET
BASOS PCT: 0.5 % (ref 0.0–3.0)
Basophils Absolute: 0 10*3/uL (ref 0.0–0.1)
EOS ABS: 0.2 10*3/uL (ref 0.0–0.7)
EOS PCT: 2.8 % (ref 0.0–5.0)
HCT: 36.9 % (ref 36.0–46.0)
Hemoglobin: 12.6 g/dL (ref 12.0–15.0)
Lymphocytes Relative: 18.2 % (ref 12.0–46.0)
Lymphs Abs: 1.2 10*3/uL (ref 0.7–4.0)
MCHC: 34 g/dL (ref 30.0–36.0)
MCV: 87.1 fl (ref 78.0–100.0)
MONOS PCT: 10.3 % (ref 3.0–12.0)
Monocytes Absolute: 0.7 10*3/uL (ref 0.1–1.0)
NEUTROS PCT: 68.2 % (ref 43.0–77.0)
Neutro Abs: 4.4 10*3/uL (ref 1.4–7.7)
Platelets: 241 10*3/uL (ref 150.0–400.0)
RBC: 4.24 Mil/uL (ref 3.87–5.11)
RDW: 13 % (ref 11.5–15.5)
WBC: 6.4 10*3/uL (ref 4.0–10.5)

## 2014-08-03 LAB — BASIC METABOLIC PANEL
BUN: 12 mg/dL (ref 6–23)
CALCIUM: 9.9 mg/dL (ref 8.4–10.5)
CHLORIDE: 104 meq/L (ref 96–112)
CO2: 30 mEq/L (ref 19–32)
CREATININE: 0.71 mg/dL (ref 0.40–1.20)
GFR: 86.9 mL/min (ref >60.00)
GLUCOSE: 104 mg/dL — AB (ref 70–99)
POTASSIUM: 3.7 meq/L (ref 3.5–5.1)
Sodium: 140 mEq/L (ref 135–145)

## 2014-08-03 LAB — LIPID PANEL
Cholesterol: 218 mg/dL — ABNORMAL HIGH (ref 0–200)
HDL: 59.8 mg/dL (ref >39.00)
LDL Cholesterol: 122 mg/dL — ABNORMAL HIGH (ref 0–99)
NonHDL: 158.2
TRIGLYCERIDES: 179 mg/dL — AB (ref 0.0–149.0)
Total CHOL/HDL Ratio: 4
VLDL: 35.8 mg/dL (ref 0.0–40.0)

## 2014-08-03 LAB — TSH: TSH: 1.36 u[IU]/mL (ref 0.35–4.50)

## 2014-08-03 LAB — HEPATIC FUNCTION PANEL
ALK PHOS: 95 U/L (ref 39–117)
ALT: 9 U/L (ref 0–35)
AST: 16 U/L (ref 0–37)
Albumin: 3.8 g/dL (ref 3.5–5.2)
BILIRUBIN DIRECT: 0.1 mg/dL (ref 0.0–0.3)
BILIRUBIN TOTAL: 0.6 mg/dL (ref 0.2–1.2)
TOTAL PROTEIN: 7 g/dL (ref 6.0–8.3)

## 2014-08-03 MED ORDER — PNEUMOCOCCAL 13-VAL CONJ VACC IM SUSP
0.5000 mL | Freq: Once | INTRAMUSCULAR | Status: AC
  Administered 2014-08-03: 0.5 mL via INTRAMUSCULAR

## 2014-08-03 MED ORDER — ATORVASTATIN CALCIUM 10 MG PO TABS: 10.0000 mg | ORAL_TABLET | Freq: Every day | ORAL | Status: DC

## 2014-08-03 NOTE — Assessment & Plan Note (Signed)

## 2014-08-03 NOTE — Progress Notes (Signed)
Subjective:    Patient ID: Deanna Buck, female    DOB: July 24, 1945, 69 y.o.   MRN: [id number]  HPI  Here for wellness and f/u;  Overall doing ok;  Pt denies Chest pain, worsening SOB, DOE, wheezing, orthopnea, PND, worsening LE edema, palpitations, dizziness or syncope.  Pt denies neurological change such as new headache, facial or extremity weakness.  Pt denies polydipsia, polyuria, or low sugar symptoms. Pt states overall good compliance with treatment and medications, good tolerability, and has been trying to follow appropriate diet.  Pt denies worsening depressive symptoms, suicidal ideation or panic. No fever, night sweats, wt loss, loss of appetite, or other constitutional symptoms.  Pt states good ability with ADL's, has low fall risk, home safety reviewed and adequate, no other significant changes in hearing or vision, and only occasionally active with exercise.  No current complaints Past Medical History  Diagnosis Date  . ECZEMA   . ANXIETY   . GERD   . OA (osteoarthritis)   . Abnormal mammogram 05/2013    L breast  . Full dentures   . Wears contact lenses     also wears glasses  . Breast cancer     left breast  . Radiation 10/24/13-11/30/13    left chest wall and axilla  . Hypertension     Pt states she takes Nadolol for Anxiety, not BP   Past Surgical History  Procedure Laterality Date  . 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 Breast lumpectomy  1982    rt and lt benign  . Breast reconstruction with placement of tissue expander and flex hd (acellular hydrated dermis) Left 08/04/2013    Procedure: BREAST RECONSTRUCTION WITH PLACEMENT OF TISSUE EXPANDER AND  POSSIBLE FLEX HD (ACELLULAR HYDRATED DERMIS);  Surgeon: [name], MD;  Location: [place];  Service: Plastics;  Laterality: Left;  . Incision and drainage of wound Left 08/30/2013    Procedure: DEBRIDEMENT OF LEFT BREAST;  Surgeon: [name], MD;  Location: [place];  Service: Plastics;  Laterality: Left;  Marland Kitchen Mass excision Right 05/30/2014    Procedure: EXCISION OF RIGHT AXILLARY LIPOMA;  Surgeon: [name], MD;  Location: [place];  Service: Plastics;  Laterality: Right;  . Removal of tissue expander and placement of implant Left 05/30/2014    Procedure: REMOVAL OF LEFT TISSUE EXPANDER AND PLACEMENT OF IMPLANT;  Surgeon: [name], MD;  Location: [place];  Service: Plastics;  Laterality: Left;  . Liposuction with lipofilling Left 05/30/2014    Procedure: LIPOFILLING TO LEFT CHEST;  Surgeon: [name], MD;  Location: [place];  Service: Plastics;  Laterality: Left;  Marland Kitchen Mastopexy Right 05/30/2014    Procedure: RIGHT MASTOPEXY FOR SYMMETRY;  Surgeon: [name], MD;  Location: [place];  Service: Plastics;  Laterality: Right;    reports that she has never smoked. She does not have any smokeless tobacco history on file. She reports that she drinks alcohol. She reports that she does not use illicit drugs. family history includes Arthritis in her other; Breast cancer in her mother; Dementia in her mother; Hyperlipidemia in her other; Hypertension in her other; Kidney disease in her other. Allergies  Allergen Reactions  . Morphine And Related Shortness Of Breath  . Sulfonamide Derivatives Hives and Itching  . Banana Other (See Comments)    Pain  . Penicillins  Other (See Comments)    Dr. Sherolyn Buba told her she was allergic to PCN.   Current Outpatient Prescriptions on File Prior to Visit  Medication Sig Dispense Refill  . esomeprazole (NEXIUM) 40 MG capsule Take 40 mg by mouth as needed.     . hydrochlorothiazide (HYDRODIURIL) 25 MG tablet Take 1 tablet (25 mg total) by mouth daily. 90 tablet 3  . nadolol (CORGARD) 40 MG tablet Take 0.5 tablets (20 mg total) by mouth daily. 45 tablet 3  . non-metallic deodorant  (ALRA) MISC Apply 1 application topically daily as needed.    . tamoxifen (NOLVADEX) 20 MG tablet     . UNABLE TO FIND 174.4 Left Mastectomy  L8015-Post-Mastectomy Garment-2 1 each 0  . valACYclovir (VALTREX) 1000 MG tablet Take 1 tablet (1,000 mg total) by mouth daily as needed. 30 tablet 0  . vitamin B-12 (CYANOCOBALAMIN) 100 MCG tablet Take 50 mcg by mouth as needed.     Marland Kitchen acetaminophen (TYLENOL) 650 MG CR tablet Take 650 mg by mouth every 8 (eight) hours as needed for pain.    . ciprofloxacin (CIPRO) 500 MG tablet Take 1 tablet (500 mg total) by mouth 2 (two) times daily. (Patient not taking: Reported on 07/20/2014) 10 tablet 0  . diazepam (VALIUM) 5 MG tablet Take 1 tablet (5 mg total) by mouth every 8 (eight) hours as needed for anxiety. (Patient not taking: Reported on 07/20/2014) 30 tablet 0  . HYDROcodone-acetaminophen (NORCO) 5-325 MG per tablet Take 1-2 tablets by mouth every 6 (six) hours as needed for moderate pain. (Patient not taking: Reported on 07/20/2014) 50 tablet 0   No current facility-administered medications on file prior to visit.    Review of Systems Constitutional: Negative for increased diaphoresis, other activity, appetite or siginficant weight change other than noted HENT: Negative for worsening hearing loss, ear pain, facial swelling, mouth sores and neck stiffness.   Eyes: Negative for other worsening pain, redness or visual disturbance.  Respiratory: Negative for shortness of breath and wheezing  Cardiovascular: Negative for chest pain and palpitations.  Gastrointestinal: Negative for diarrhea, blood in stool, abdominal distention or other pain Genitourinary: Negative for hematuria, flank pain or change in urine volume.  Musculoskeletal: Negative for myalgias or other joint complaints.  Skin: Negative for color change and wound or drainage.  Neurological: Negative for syncope and numbness. other than noted Hematological: Negative for adenopathy. or other  swelling Psychiatric/Behavioral: Negative for hallucinations, SI, self-injury, decreased concentration or other worsening agitation.      Objective:   Physical Exam BP 130/78 mmHg  Pulse 65  Temp(Src) 97.6 F (36.4 C) (Oral)  Ht 5\' 4"  (1.626 m)  Wt 158 lb (71.668 kg)  BMI 27.11 kg/m2  SpO2 96% VS noted,  Constitutional: Pt is oriented to person, place, and time. Appears well-developed and well-nourished, in no significant distress Head: Normocephalic and atraumatic.  Right Ear: External ear normal.  Left Ear: External ear normal.  Nose: Nose normal.  Mouth/Throat: Oropharynx is clear and moist.  Eyes: Conjunctivae and EOM are normal. Pupils are equal, round, and reactive to light.  Neck: Normal range of motion. Neck supple. No JVD present. No tracheal deviation present or significant neck LA or mass Cardiovascular: Normal rate, regular rhythm, normal heart sounds and intact distal pulses.   Pulmonary/Chest: Effort normal and breath sounds without rales or wheezing  Abdominal: Soft. Bowel sounds are normal. NT. No HSM  Musculoskeletal: Normal range of motion. Exhibits no edema.  Lymphadenopathy:  Has  no cervical adenopathy.  Neurological: Pt is alert and oriented to person, place, and time. Pt has normal reflexes. No cranial nerve deficit. Motor grossly intact Skin: Skin is warm and dry. No rash noted.  Psychiatric:  Has normal mood and affect. Behavior is normal.      Assessment & Plan:

## 2014-08-03 NOTE — Patient Instructions (Addendum)
You had the new Prevnar pneumonia shot  Your EKG was OK today  Please continue all other medications as before, and refills have been done if requested.  Please have the pharmacy call with any other refills you may need.  Please continue your efforts at being more active, low cholesterol diet, and weight control.  You are otherwise up to date with prevention measures today.  Please keep your appointments with your specialists as you may have planned  Please go to the LAB in the Basement (turn left off the elevator) for the tests to be done today  You will be contacted by phone if any changes need to be made immediately.  Otherwise, you will receive a letter about your results with an explanation, but please check with MyChart first.  Please remember to sign up for MyChart if you have not done so, as this will be important to you in the future with finding out test results, communicating by private email, and scheduling acute appointments online when needed.  Please return in 1 year for your yearly visit, or sooner if needed, with Lab testing done 3-5 days before

## 2014-08-03 NOTE — Progress Notes (Signed)
Pre visit review using our clinic review tool, if applicable. No additional management support is needed unless otherwise documented below in the visit note. 

## 2014-08-03 NOTE — Addendum Note (Signed)
Addended by: Lyman Bishop on: 08/03/2014 11:42 AM   Modules accepted: Orders

## 2014-08-06 ENCOUNTER — Encounter: Payer: Self-pay | Admitting: Internal Medicine

## 2014-08-07 NOTE — Telephone Encounter (Signed)
Pt wanted to know if the lipitor was absolutely needed or if cholesterol could be controlled with diet and exercise.

## 2014-08-31 ENCOUNTER — Other Ambulatory Visit (HOSPITAL_BASED_OUTPATIENT_CLINIC_OR_DEPARTMENT_OTHER): Payer: BLUE CROSS/BLUE SHIELD

## 2014-08-31 DIAGNOSIS — C50412 Malignant neoplasm of upper-outer quadrant of left female breast: Secondary | ICD-10-CM | POA: Diagnosis not present

## 2014-08-31 LAB — COMPREHENSIVE METABOLIC PANEL (CC13)
ALT: 13 U/L (ref 0–55)
ANION GAP: 9 meq/L (ref 3–11)
AST: 19 U/L (ref 5–34)
Albumin: 3.4 g/dL — ABNORMAL LOW (ref 3.5–5.0)
Alkaline Phosphatase: 101 U/L (ref 40–150)
BILIRUBIN TOTAL: 0.67 mg/dL (ref 0.20–1.20)
BUN: 12.8 mg/dL (ref 7.0–26.0)
CHLORIDE: 108 meq/L (ref 98–109)
CO2: 25 mEq/L (ref 22–29)
Calcium: 9.6 mg/dL (ref 8.4–10.4)
Creatinine: 0.8 mg/dL (ref 0.6–1.1)
EGFR: 82 mL/min/{1.73_m2} — AB (ref >90)
GLUCOSE: 94 mg/dL (ref 70–140)
Potassium: 3.6 mEq/L (ref 3.5–5.1)
SODIUM: 142 meq/L (ref 136–145)
TOTAL PROTEIN: 6.7 g/dL (ref 6.4–8.3)

## 2014-08-31 LAB — CBC WITH DIFFERENTIAL/PLATELET
BASO%: 1 % (ref 0.0–2.0)
BASOS ABS: 0.1 10*3/uL (ref 0.0–0.1)
EOS%: 2 % (ref 0.0–7.0)
Eosinophils Absolute: 0.1 10*3/uL (ref 0.0–0.5)
HCT: 36.5 % (ref 34.8–46.6)
HGB: 12.5 g/dL (ref 11.6–15.9)
LYMPH%: 16.6 % (ref 14.0–49.7)
MCH: 29.3 pg (ref 25.1–34.0)
MCHC: 34.1 g/dL (ref 31.5–36.0)
MCV: 85.9 fL (ref 79.5–101.0)
MONO#: 0.8 10*3/uL (ref 0.1–0.9)
MONO%: 14 % (ref 0.0–14.0)
NEUT%: 66.4 % (ref 38.4–76.8)
NEUTROS ABS: 4 10*3/uL (ref 1.5–6.5)
PLATELETS: 180 10*3/uL (ref 145–400)
RBC: 4.25 10*6/uL (ref 3.70–5.45)
RDW: 12.7 % (ref 11.2–14.5)
WBC: 6 10*3/uL (ref 3.9–10.3)
lymph#: 1 10*3/uL (ref 0.9–3.3)

## 2014-09-07 ENCOUNTER — Ambulatory Visit (HOSPITAL_BASED_OUTPATIENT_CLINIC_OR_DEPARTMENT_OTHER): Payer: BLUE CROSS/BLUE SHIELD | Admitting: Oncology

## 2014-09-07 ENCOUNTER — Telehealth: Payer: Self-pay | Admitting: Oncology

## 2014-09-07 VITALS — BP 154/62 | HR 68 | Temp 97.7°F | Resp 18 | Ht 64.0 in | Wt 156.3 lb

## 2014-09-07 DIAGNOSIS — Z79810 Long term (current) use of selective estrogen receptor modulators (SERMs): Secondary | ICD-10-CM | POA: Diagnosis not present

## 2014-09-07 DIAGNOSIS — Z853 Personal history of malignant neoplasm of breast: Secondary | ICD-10-CM

## 2014-09-07 DIAGNOSIS — C50412 Malignant neoplasm of upper-outer quadrant of left female breast: Secondary | ICD-10-CM

## 2014-09-07 MED ORDER — TAMOXIFEN CITRATE 20 MG PO TABS: 20.0000 mg | ORAL_TABLET | Freq: Every day | ORAL | Status: DC

## 2014-09-07 NOTE — Telephone Encounter (Signed)
lvm for pt regarding to April appt.... °

## 2014-09-07 NOTE — Progress Notes (Signed)
Deanna Buck  [phone] [phone]     ID: Deanna Buck DOB: 1945-03-26  MR#: [id number]  YHC#:[id number]  Patient Care Team: Deanna Borg, MD as PCP - General (Internal Medicine) Deanna Charleston, MD as Consulting Physician (Obstetrics and Gynecology) Deanna Matin, MD as Consulting Physician (Dermatology) Deanna Cruel, MD as Consulting Physician (Oncology) Deanna Buck M.D., Deanna Buck, Deanna Buck  CHIEF COMPLAINT: Early stage multifocal breast cancer  CURRENT TREATMENT:  tamoxifen    BREAST CANCER HISTORY: From the original intake note:  Deanna Buck had routine yearly screening mammography with tomography 05/27/2013 at the breast Center, showing a possible area of distortion in the left breast. Left diagnostic mammography and ultrasonography 06/13/2013 showed the breast density to be category C. In the upper outer quadrant of the left breast there was a mass measuring approximately 1.8 cm. It was not palpable on exam. Ultrasound showed a hypoechoic irregular mass measuring 1.3 cm correlating with the mammographic finding. In addition, a smaller, 1.0 cm mass was detected approximately 3 cm from the index lesion. The left axilla was negative.  Biopsy of both masses in the left breast 06/22/2013 showed ([id number]) morphologically similar invasive mammary carcinoma, grade 1 or 2, estrogen receptor positive at 93%, progesterone receptor positive at 91%, both with strong staining intensity, with an MIB-1 of 11%, and no HER-2 amplification, the signals ratio being 1.03 and the number per cell 1.55.  On 06/29/2013 the patient underwent bilateral breast MRI at [facility]. By MRI the larger left breast mass measured 3.3 cm. The second, smaller mass measured 1.1 cm. Combined the masses measures 3.9 cm maximally. [By mammography, the masses were 2.4 cm apart with a combined diameter of 4.9 cm.]  The patient's subsequent history is as detailed  below  INTERVAL HISTORY: Deanna Buck returns today for followup of her breast cancer accompanied by her husband Deanna Buck. The interval history is significant for her completing her reconstruction mid-May. She did well from the procedure, with no significant pain, bleeding, or fever. She is fairly well satisfied, enough that she doesn't think she wants any further "touching up" in the future.--She continues on tamoxifen. Hot flashes are rare. She does not have a vaginal dryness. She is obtaining at no cost.  REVIEW OF SYSTEMS: She has mild problems with her dentures. She has eczema in the top of her left foot which is a chronic problem previously dealt with by Dr. Jarome Buck. She has arthritis pains here in there which are not more intense or persistent than before. From her surgery the main limitation is that she can't sleep on her left side. She has an excellent range of motion. She is not exercising regularly. A detailed review of systems today was otherwise noncontributory  PAST MEDICAL HISTORY: Past Medical History  Diagnosis Date  . ECZEMA   . ANXIETY   . GERD   . OA (osteoarthritis)   . Abnormal mammogram 05/2013    L breast  . Full dentures   . Wears contact lenses     also wears glasses  . Breast cancer     left breast  . Radiation 10/24/13-11/30/13    left chest wall and axilla  . Hypertension     Pt states she takes Nadolol for Anxiety, not BP    PAST SURGICAL HISTORY: Past Surgical History  Procedure Laterality Date  . 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  Breast lumpectomy  1982    rt and lt benign  . Breast reconstruction with placement of tissue expander and flex hd (acellular hydrated dermis) Left 08/04/2013    Procedure: BREAST RECONSTRUCTION WITH PLACEMENT OF TISSUE EXPANDER AND  POSSIBLE FLEX HD (ACELLULAR HYDRATED DERMIS);  Surgeon: Deanna Limbo, MD;  Location: [facility];  Service: Plastics;   Laterality: Left;  . Incision and drainage of wound Left 08/30/2013    Procedure: DEBRIDEMENT OF LEFT BREAST;  Surgeon: Deanna Limbo, MD;  Location: [place] [facility];  Service: Plastics;  Laterality: Left;  Marland Kitchen Mass excision Right 05/30/2014    Procedure: EXCISION OF RIGHT AXILLARY LIPOMA;  Surgeon: Deanna Limbo, MD;  Location: [facility];  Service: Plastics;  Laterality: Right;  . Removal of tissue expander and placement of implant Left 05/30/2014    Procedure: REMOVAL OF LEFT TISSUE EXPANDER AND PLACEMENT OF IMPLANT;  Surgeon: Deanna Limbo, MD;  Location: [facility];  Service: Plastics;  Laterality: Left;  . Liposuction with lipofilling Left 05/30/2014    Procedure: LIPOFILLING TO LEFT CHEST;  Surgeon: Deanna Limbo, MD;  Location: [facility];  Service: Plastics;  Laterality: Left;  Marland Kitchen Mastopexy Right 05/30/2014    Procedure: RIGHT MASTOPEXY FOR SYMMETRY;  Surgeon: Deanna Limbo, MD;  Location: [facility];  Service: Plastics;  Laterality: Right;    FAMILY HISTORY Family History  Problem Relation Age of Onset  . Breast cancer Mother   . Dementia Mother   . Arthritis Other   . Hypertension Other     parent  . Hyperlipidemia Other     parent  . Kidney disease Other     parent   the patient's father died at the age of 76 with congestive heart failure. The patient's mother died at the age of 63. She was diagnosed with breast cancer at the age of 66. The only other breast cancer in the family was on the opposite, father's side, a first cousin diagnosed in her 67s. The patient had one brother who died from a myocardial infarction. She had no sisters.  GYNECOLOGIC HISTORY:  No LMP recorded. Patient is postmenopausal. Menarche age 2, first live birth age 41, the patient is [place] P2. She underwent menopause approximately 2002. She did not take hormone replacement. She did take birth control pills remotely for  approximately 13 years, with no complications.  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 Her children from a prior marriage are Deanna Buck, who is an Optometrist, and Deanna Buck, who is also an Optometrist. Both live in [place]. The patient's husband Deanna Buck work for [employer]. The patient has 2 grandchildren. She is not a Ambulance person.    ADVANCED DIRECTIVES: In place   HEALTH MAINTENANCE: Social History  Substance Use Topics  . Smoking status: Never Smoker   . Smokeless tobacco: Not on file     Comment: Married, but in stressful relationship with spouse  . Alcohol Use: Yes     Comment: rare glass wine     Colonoscopy:  PAP:  Bone density: 03/31/2011 ad lib. are showing significant osteopenia, with the lowest T score at -2.4  Lipid panel:  Allergies  Allergen Reactions  . Morphine And Related Shortness Of Breath  . Sulfonamide Derivatives Hives and Itching  . Banana Other (See Comments)    Pain  . Penicillins Other (See Comments)    Dr. Sherolyn Buba told her she was allergic to PCN.  Current Outpatient Prescriptions  Medication Sig Dispense Refill  . acetaminophen (TYLENOL) 650 MG CR tablet Take 650 mg by mouth every 8 (eight) hours as needed for pain.    Marland Kitchen atorvastatin (LIPITOR) 10 MG tablet Take 1 tablet (10 mg total) by mouth daily. 90 tablet 3  . ciprofloxacin (CIPRO) 500 MG tablet Take 1 tablet (500 mg total) by mouth 2 (two) times daily. (Patient not taking: Reported on 07/20/2014) 10 tablet 0  . diazepam (VALIUM) 5 MG tablet Take 1 tablet (5 mg total) by mouth every 8 (eight) hours as needed for anxiety. (Patient not taking: Reported on 07/20/2014) 30 tablet 0  . esomeprazole (NEXIUM) 40 MG capsule Take 40 mg by mouth as needed.     . hydrochlorothiazide (HYDRODIURIL) 25 MG tablet Take 1 tablet (25 mg total) by mouth daily. 90 tablet 3  . HYDROcodone-acetaminophen (NORCO) 5-325 MG per tablet Take 1-2 tablets by mouth every 6 (six) hours  as needed for moderate pain. (Patient not taking: Reported on 07/20/2014) 50 tablet 0  . nadolol (CORGARD) 40 MG tablet Take 0.5 tablets (20 mg total) by mouth daily. 45 tablet 3  . non-metallic deodorant (ALRA) MISC Apply 1 application topically daily as needed.    . tamoxifen (NOLVADEX) 20 MG tablet     . UNABLE TO FIND 174.4 Left Mastectomy  L8015-Post-Mastectomy Garment-2 1 each 0  . valACYclovir (VALTREX) 1000 MG tablet Take 1 tablet (1,000 mg total) by mouth daily as needed. 30 tablet 0  . vitamin B-12 (CYANOCOBALAMIN) 100 MCG tablet Take 50 mcg by mouth as needed.      No current facility-administered medications for this visit.    OBJECTIVE:  Middle-aged white woman in no acute distress Filed Vitals:   09/07/14 1055  BP: 154/62  Pulse: 68  Temp: 97.7 F (36.5 C)  Resp: 18     Body mass index is 26.82 kg/(m^2).    ECOG FS:0 - Asymptomatic  Sclerae unicteric, pupils round and equal Oropharynx clear and moist-- no thrush or other lesions No cervical or supraclavicular adenopathy Lungs no rales or rhonchi Heart regular rate and rhythm Abd soft, nontender, positive bowel sounds MSK no focal spinal tenderness, no upper extremity lymphedema Neuro: nonfocal, well oriented, appropriate affect Breasts: The right breast is status post mastopexy. There are no findings of concern. The left breast is status post mastectomy with implant reconstruction. It is somewhat firm, and not entirely symmetrical with the contralateral breast. There is no evidence of local recurrence. There are some telangiectasias especially inferiorly. The left axilla is benign    LAB RESULTS:  CMP     Component Value Date/Time   NA 142 08/31/2014 1057   NA 140 08/03/2014 1144   K 3.6 08/31/2014 1057   K 3.7 08/03/2014 1144   CL 104 08/03/2014 1144   CO2 25 08/31/2014 1057   CO2 30 08/03/2014 1144   GLUCOSE 94 08/31/2014 1057   GLUCOSE 104* 08/03/2014 1144   BUN 12.8 08/31/2014 1057   BUN 12 08/03/2014  1144   CREATININE 0.8 08/31/2014 1057   CREATININE 0.71 08/03/2014 1144   CALCIUM 9.6 08/31/2014 1057   CALCIUM 9.9 08/03/2014 1144   PROT 6.7 08/31/2014 1057   PROT 7.0 08/03/2014 1144   ALBUMIN 3.4* 08/31/2014 1057   ALBUMIN 3.8 08/03/2014 1144   AST 19 08/31/2014 1057   AST 16 08/03/2014 1144   ALT 13 08/31/2014 1057   ALT 9 08/03/2014 1144   ALKPHOS 101 08/31/2014 1057  ALKPHOS 95 08/03/2014 1144   BILITOT 0.67 08/31/2014 1057   BILITOT 0.6 08/03/2014 1144   GFRNONAA >60 05/26/2014 1100   GFRAA >60 05/26/2014 1100    I No results found for: SPEP  Lab Results  Component Value Date   WBC 6.0 08/31/2014   NEUTROABS 4.0 08/31/2014   HGB 12.5 08/31/2014   HCT 36.5 08/31/2014   MCV 85.9 08/31/2014   PLT 180 08/31/2014      Chemistry      Component Value Date/Time   NA 142 08/31/2014 1057   NA 140 08/03/2014 1144   K 3.6 08/31/2014 1057   K 3.7 08/03/2014 1144   CL 104 08/03/2014 1144   CO2 25 08/31/2014 1057   CO2 30 08/03/2014 1144   BUN 12.8 08/31/2014 1057   BUN 12 08/03/2014 1144   CREATININE 0.8 08/31/2014 1057   CREATININE 0.71 08/03/2014 1144      Component Value Date/Time   CALCIUM 9.6 08/31/2014 1057   CALCIUM 9.9 08/03/2014 1144   ALKPHOS 101 08/31/2014 1057   ALKPHOS 95 08/03/2014 1144   AST 19 08/31/2014 1057   AST 16 08/03/2014 1144   ALT 13 08/31/2014 1057   ALT 9 08/03/2014 1144   BILITOT 0.67 08/31/2014 1057   BILITOT 0.6 08/03/2014 1144       No results found for: LABCA2  No components found for: LABCA125  No results for input(s): INR in the last 168 hours.  Urinalysis    Component Value Date/Time   COLORURINE YELLOW 08/03/2014 1144   APPEARANCEUR CLEAR 08/03/2014 1144   LABSPEC 1.010 08/03/2014 1144   PHURINE 6.5 08/03/2014 1144   GLUCOSEU NEGATIVE 08/03/2014 1144   GLUCOSEU NEGATIVE 12/26/2008 1615   HGBUR NEGATIVE 08/03/2014 1144   HGBUR negative 09/11/2009 1413   BILIRUBINUR NEGATIVE 08/03/2014 1144   BILIRUBINUR  neg 02/17/2011 1625   KETONESUR NEGATIVE 08/03/2014 1144   PROTEINUR neg 02/17/2011 1625   PROTEINUR NEGATIVE 12/26/2008 1615   UROBILINOGEN 0.2 08/03/2014 1144   UROBILINOGEN 0.2 02/17/2011 1625   NITRITE NEGATIVE 08/03/2014 1144   NITRITE pos 02/17/2011 1625   LEUKOCYTESUR SMALL* 08/03/2014 1144    STUDIES: CLINICAL DATA: History of left breast cancer status post mastectomy 2015. Pre operative right mammogram ordered before reduction mammoplasty.  EXAM: DIGITAL DIAGNOSTIC RIGHT MAMMOGRAM WITH 3D TOMOSYNTHESIS AND CAD  COMPARISON: With priors.  ACR Breast Density Category c: The breast tissue is heterogeneously dense, which may obscure small masses.  FINDINGS: No suspicious mass, distortion or malignant type microcalcifications identified in the right breast peer  Mammographic images were processed with CAD.  IMPRESSION: No evidence of malignancy in the right breast.  RECOMMENDATION: Right diagnostic mammogram in 1 year is recommended peer  I have discussed the findings and recommendations with the patient. Results were also provided in writing at the conclusion of the visit. If applicable, a reminder letter will be sent to the patient regarding the next appointment.  BI-RADS CATEGORY 1: Negative.   Electronically Signed  By: Lillia Mountain M.D.  On: 04/26/2014 14:25   ASSESSMENT: 69 y.o. [place] woman status post biopsy of 2 separate left breast masses 06/22/2013 for a clinical mT1c N0, stage IA carcinoma, grade 1 or 2, estrogen receptor 93% positive, progesterone receptor 91% positive, with an MIB-1 of 11%, and no HER-2 amplification  (1) status post left mastectomy and sentinel lymph node sampling 08/04/2013 for a pT2 pN1, stage IIB invasive ductal carcinoma, grade 2, with negative margins, and repeat HER-2 again negative  (  2) Oncotype score of 13 predicts a risk of recurrence or mortality within 5 years of 10% with tamoxifen alone, and 11%  with tamoxifen plus chemotherapy; the patient was offered chemotherapy vs. participation in S007, but decided against both  (3) tamoxifen started 09/22/2013  (4) radiation  completed 11/30/2013  Left chest wall/reconstructed area/axilla -45 gray in 25 fraction  Additional boost to the left chest wall/reconstructed area 5.4 gray for chemotherapy dose of 50.4 gray  (5)  Completed reconstruction 05/30/2014, namely: 1. Removal left tissue expander and placement silicone implant 2. Lipofilling to left chest 3. Acellular dermis for breast reconstruction left, total 50 cm2 4. Right mastopexy for symmetry 5. Excision right axillary lipoma 6 cm  PLAN: Robi is not entirely happy but sufficiently content with her reconstruction that she does not want further surgery.  Today we discussed five-year versus 10 year antiestrogen plans. She understands that there is an additional 3% or so risk reduction by going the extra 5 years but that only approximately 1% of that will be systemic. At this point she is fairly clear that she would like to stop anti-estrogens after 5 years.  That means that we will be switching to anastrozole after 2 years or so of tamoxifen. We will discuss the toxicities, side effects and complications of that agent at that time in more detail  If she sees Dr. Donne Hazel sometime this winter, then she will see me again late April 2017, after her next mammogram. She normally sees her primary care physician in June and her gynecologist in September.  After her next visit here, in April 2017, we will start seeing her on a once a year basis.  Calan is very pleased with this plan. She knows the goal of treatment in her case is cure. She will call with any problems that may develop before the next visit here.    Deanna Cruel, MD   09/07/2014 11:07 AM

## 2014-09-26 ENCOUNTER — Other Ambulatory Visit: Payer: Self-pay | Admitting: Obstetrics and Gynecology

## 2014-09-27 LAB — CYTOLOGY - PAP

## 2014-10-23 ENCOUNTER — Encounter (HOSPITAL_BASED_OUTPATIENT_CLINIC_OR_DEPARTMENT_OTHER): Payer: Self-pay | Admitting: *Deleted

## 2014-10-23 NOTE — H&P (Signed)
Subjective:    Patient ID: Deanna Buck is a 69 y.o. female.  Follow-up   4.5 months post op implant exchange, lipofilling, additional ADM on left and right mastopexy, excision right axillary lipoma on right. 1 week post debridement and closure of spontaneous opening in mastectomy flap with exposure expander. States no drainage for 3-4 days post procedure then intermittent. No fevers.   Post op from L NSM with SLN. Course complicated by mastectomy skin flap necrosis lateral to NAC and underwent debridement and exchange TE. Completion of XRT mid November 2015.   Final pathology with 2/4 SLN positive and final 3.6 cm area of IDC. OnTamoxifen. Pt presented on screening MMG with abnomal left breast. Noted to have a 2 separate lesions one being in the 12:00 position 1.8 x1.3 cm, in the second lesion in the 1:00 position. 1.3 x 1.2 cm. Ultrasound-guided biopsy revealed at both locationenvasive mammary carcinoma. ER/PR+, no amplification of HER-2 detected. MRI of the breast confirmed 2 separate lesions. Oncotype score of 13; the patient was offered chemotherapy vs. participation in S007, but decided against both.  Has had bilateral breast excisions in past for benign disease via periareolar incisions. Prior 46 D or DD. Mastectomy weight 474 g  Review of Systems     Objective:   Physical Exam  Cardiovascular: Normal rate and regular rhythm.  Pulmonary/Chest: Effort normal and breath sounds normal.    Left upper outer quadrant reconstructed breast with continued 2 mm opening and serous drainage, in area of scar and attenuated skin- gray color skin consistent with thin soft tissue flap and implant beneath   Assessment:     Open wound left breast with exposure implant Hx Multifocal left breast ca. S/p mastectomy, XRT S/p debridement left mastectomy flap exchange TE S/p right mastopexy, excision right axillary lipoma, left lipofilling and silicone implant, additional ADM on  left    Plan:     Exposure implant in setting thin attenuated mastectomy flaps, radiation. Offered option removal of implant and allow to heal. Options to forgo further reconstruction efforts and remove mastectomy skin including NAC and have flap chest with plan for prosthesis vs. Leave mastectomy flap skin to use for future reconstruction. Counseled if further reconstruction desired, feel she does need some type of flap either latissimus with TE or TRAM. She is experiencing the risks of reconstruction in setting of radiation and thin flaps incurred from the initial mastectomy flap necrosis and I would not recommend trying to pursue reconstruction at this time without flap. Reviewed portfolio pictures of latissimus, back donor site, drains. Counseled if she does not do latissimus presently for salvage reconstruction, could elect for this in future.  At this time patient elects for removal implant, removal mastectomy flap skin including NAC. Counseled in setting of radiation cannot assure no wound healing problems, but will be less than if implant/prosthetic material in place.       [name], MD [facility] [phone]

## 2014-10-24 ENCOUNTER — Other Ambulatory Visit: Payer: Self-pay | Admitting: Oncology

## 2014-10-24 ENCOUNTER — Ambulatory Visit (HOSPITAL_BASED_OUTPATIENT_CLINIC_OR_DEPARTMENT_OTHER): Payer: BLUE CROSS/BLUE SHIELD | Admitting: Certified Registered"

## 2014-10-24 ENCOUNTER — Encounter (HOSPITAL_BASED_OUTPATIENT_CLINIC_OR_DEPARTMENT_OTHER)
Admission: RE | Disposition: A | Discharge status: Home / Self Care | Payer: Self-pay | Source: Ambulatory Visit | Attending: Plastic Surgery

## 2014-10-24 ENCOUNTER — Encounter (HOSPITAL_BASED_OUTPATIENT_CLINIC_OR_DEPARTMENT_OTHER): Payer: Self-pay | Admitting: *Deleted

## 2014-10-24 ENCOUNTER — Ambulatory Visit (HOSPITAL_BASED_OUTPATIENT_CLINIC_OR_DEPARTMENT_OTHER)
Admission: RE | Admit: 2014-10-24 | Discharge: 2014-10-24 | Disposition: A | Discharge status: Home / Self Care | Payer: BLUE CROSS/BLUE SHIELD | Source: Ambulatory Visit | Attending: Plastic Surgery | Admitting: Plastic Surgery

## 2014-10-24 DIAGNOSIS — K219 Gastro-esophageal reflux disease without esophagitis: Secondary | ICD-10-CM | POA: Insufficient documentation

## 2014-10-24 DIAGNOSIS — M1990 Unspecified osteoarthritis, unspecified site: Secondary | ICD-10-CM | POA: Diagnosis not present

## 2014-10-24 DIAGNOSIS — Z9012 Acquired absence of left breast and nipple: Secondary | ICD-10-CM | POA: Insufficient documentation

## 2014-10-24 DIAGNOSIS — Y831 Surgical operation with implant of artificial internal device as the cause of abnormal reaction of the patient, or of later complication, without mention of misadventure at the time of the procedure: Secondary | ICD-10-CM | POA: Diagnosis not present

## 2014-10-24 DIAGNOSIS — T8549XA Other mechanical complication of breast prosthesis and implant, initial encounter: Secondary | ICD-10-CM | POA: Diagnosis not present

## 2014-10-24 DIAGNOSIS — I10 Essential (primary) hypertension: Secondary | ICD-10-CM | POA: Diagnosis not present

## 2014-10-24 DIAGNOSIS — F419 Anxiety disorder, unspecified: Secondary | ICD-10-CM | POA: Insufficient documentation

## 2014-10-24 DIAGNOSIS — Z923 Personal history of irradiation: Secondary | ICD-10-CM | POA: Insufficient documentation

## 2014-10-24 DIAGNOSIS — Z853 Personal history of malignant neoplasm of breast: Secondary | ICD-10-CM | POA: Diagnosis not present

## 2014-10-24 DIAGNOSIS — D649 Anemia, unspecified: Secondary | ICD-10-CM | POA: Insufficient documentation

## 2014-10-24 HISTORY — PX: BREAST IMPLANT REMOVAL: SHX5361

## 2014-10-24 LAB — POCT I-STAT, CHEM 8
BUN: 12 mg/dL (ref 6–20)
CALCIUM ION: 1.23 mmol/L (ref 1.13–1.30)
CREATININE: 0.7 mg/dL (ref 0.44–1.00)
Chloride: 104 mmol/L (ref 101–111)
GLUCOSE: 106 mg/dL — AB (ref 65–99)
HCT: 35 % — ABNORMAL LOW (ref 36.0–46.0)
Hemoglobin: 11.9 g/dL — ABNORMAL LOW (ref 12.0–15.0)
Potassium: 3.2 mmol/L — ABNORMAL LOW (ref 3.5–5.1)
SODIUM: 141 mmol/L (ref 135–145)
TCO2: 25 mmol/L (ref 0–100)

## 2014-10-24 SURGERY — REMOVAL, IMPLANT, BREAST
Anesthesia: General | Site: Chest | Laterality: Left

## 2014-10-24 MED ORDER — FENTANYL CITRATE (PF) 100 MCG/2ML IJ SOLN
25.0000 ug | INTRAMUSCULAR | Status: DC | PRN
  Administered 2014-10-24: 25 ug via INTRAVENOUS
  Administered 2014-10-24: 50 ug via INTRAVENOUS
  Administered 2014-10-24 (×2): 25 ug via INTRAVENOUS

## 2014-10-24 MED ORDER — FENTANYL CITRATE (PF) 100 MCG/2ML IJ SOLN
INTRAMUSCULAR | Status: AC
  Filled 2014-10-24: qty 2

## 2014-10-24 MED ORDER — LACTATED RINGERS IV SOLN
INTRAVENOUS | Status: DC
  Administered 2014-10-24: 10 mL/h via INTRAVENOUS
  Administered 2014-10-24 (×2): via INTRAVENOUS

## 2014-10-24 MED ORDER — PROPOFOL 10 MG/ML IV BOLUS
INTRAVENOUS | Status: DC | PRN
  Administered 2014-10-24: 150 mg via INTRAVENOUS

## 2014-10-24 MED ORDER — CLINDAMYCIN PHOSPHATE 300 MG/2ML IJ SOLN
600.0000 mg | INTRAMUSCULAR | Status: AC
  Administered 2014-10-24: 600 mg via INTRAVENOUS

## 2014-10-24 MED ORDER — DEXAMETHASONE SODIUM PHOSPHATE 4 MG/ML IJ SOLN
INTRAMUSCULAR | Status: DC | PRN
  Administered 2014-10-24: 10 mg via INTRAVENOUS

## 2014-10-24 MED ORDER — OXYCODONE HCL 5 MG PO TABS: 5.0000 mg | ORAL_TABLET | ORAL | Status: DC | PRN

## 2014-10-24 MED ORDER — PROMETHAZINE HCL 25 MG/ML IJ SOLN: 6.2500 mg | INTRAMUSCULAR | Status: DC | PRN

## 2014-10-24 MED ORDER — LIDOCAINE HCL (CARDIAC) 20 MG/ML IV SOLN
INTRAVENOUS | Status: AC
Start: 2014-10-24 — End: 2014-10-24
  Filled 2014-10-24: qty 5

## 2014-10-24 MED ORDER — ONDANSETRON HCL 4 MG/2ML IJ SOLN
INTRAMUSCULAR | Status: DC | PRN
  Administered 2014-10-24: 4 mg via INTRAVENOUS

## 2014-10-24 MED ORDER — OXYCODONE HCL 5 MG PO TABS
5.0000 mg | ORAL_TABLET | Freq: Once | ORAL | Status: AC | PRN
  Administered 2014-10-24: 5 mg via ORAL

## 2014-10-24 MED ORDER — PROPOFOL 10 MG/ML IV BOLUS
INTRAVENOUS | Status: AC
  Filled 2014-10-24: qty 20

## 2014-10-24 MED ORDER — OXYCODONE HCL 5 MG PO TABS
ORAL_TABLET | ORAL | Status: AC
  Filled 2014-10-24: qty 1

## 2014-10-24 MED ORDER — OXYCODONE HCL 5 MG/5ML PO SOLN: 5.0000 mg | Freq: Once | ORAL | Status: AC | PRN

## 2014-10-24 MED ORDER — FENTANYL CITRATE (PF) 100 MCG/2ML IJ SOLN
INTRAMUSCULAR | Status: AC
  Filled 2014-10-24: qty 4

## 2014-10-24 MED ORDER — MIDAZOLAM HCL 2 MG/2ML IJ SOLN: 1.0000 mg | INTRAMUSCULAR | Status: DC | PRN

## 2014-10-24 MED ORDER — LIDOCAINE HCL (CARDIAC) 20 MG/ML IV SOLN
INTRAVENOUS | Status: DC | PRN
  Administered 2014-10-24: 60 mg via INTRAVENOUS

## 2014-10-24 MED ORDER — CHLORHEXIDINE GLUCONATE 4 % EX LIQD: 1.0000 "application " | Freq: Once | CUTANEOUS | Status: DC

## 2014-10-24 MED ORDER — FENTANYL CITRATE (PF) 100 MCG/2ML IJ SOLN
50.0000 ug | INTRAMUSCULAR | Status: AC | PRN
  Administered 2014-10-24: 25 ug via INTRAVENOUS
  Administered 2014-10-24: 50 ug via INTRAVENOUS
  Administered 2014-10-24 (×2): 25 ug via INTRAVENOUS
  Administered 2014-10-24: 50 ug via INTRAVENOUS
  Administered 2014-10-24: 25 ug via INTRAVENOUS

## 2014-10-24 MED ORDER — ONDANSETRON HCL 4 MG/2ML IJ SOLN
INTRAMUSCULAR | Status: AC
  Filled 2014-10-24: qty 2

## 2014-10-24 MED ORDER — GLYCOPYRROLATE 0.2 MG/ML IJ SOLN: 0.2000 mg | Freq: Once | INTRAMUSCULAR | Status: DC | PRN

## 2014-10-24 MED ORDER — DEXAMETHASONE SODIUM PHOSPHATE 10 MG/ML IJ SOLN
INTRAMUSCULAR | Status: AC
  Filled 2014-10-24: qty 1

## 2014-10-24 MED ORDER — BUPIVACAINE-EPINEPHRINE (PF) 0.25% -1:200000 IJ SOLN
INTRAMUSCULAR | Status: AC
  Filled 2014-10-24: qty 30

## 2014-10-24 MED ORDER — SCOPOLAMINE 1 MG/3DAYS TD PT72: 1.0000 | MEDICATED_PATCH | Freq: Once | TRANSDERMAL | Status: DC | PRN

## 2014-10-24 MED ORDER — CLINDAMYCIN PHOSPHATE 600 MG/50ML IV SOLN
INTRAVENOUS | Status: AC
  Filled 2014-10-24: qty 50

## 2014-10-24 SURGICAL SUPPLY — 59 items
BAG DECANTER FOR FLEXI CONT (MISCELLANEOUS) ×1 IMPLANT
BINDER BREAST LRG (GAUZE/BANDAGES/DRESSINGS) IMPLANT
BINDER BREAST MEDIUM (GAUZE/BANDAGES/DRESSINGS) ×1 IMPLANT
BINDER BREAST XLRG (GAUZE/BANDAGES/DRESSINGS) IMPLANT
BINDER BREAST XXLRG (GAUZE/BANDAGES/DRESSINGS) IMPLANT
BLADE SURG 15 STRL LF DISP TIS (BLADE) ×1 IMPLANT
BLADE SURG 15 STRL SS (BLADE) ×2
BNDG GAUZE ELAST 4 BULKY (GAUZE/BANDAGES/DRESSINGS) IMPLANT
CANISTER SUCT 1200ML W/VALVE (MISCELLANEOUS) ×2 IMPLANT
CHLORAPREP W/TINT 26ML (MISCELLANEOUS) ×2 IMPLANT
COVER BACK TABLE 60X90IN (DRAPES) ×2 IMPLANT
COVER MAYO STAND STRL (DRAPES) ×2 IMPLANT
DECANTER SPIKE VIAL GLASS SM (MISCELLANEOUS) IMPLANT
DRAIN CHANNEL 15F RND FF W/TCR (WOUND CARE) ×1 IMPLANT
DRAPE LAPAROSCOPIC ABDOMINAL (DRAPES) ×2 IMPLANT
DRSG PAD ABDOMINAL 8X10 ST (GAUZE/BANDAGES/DRESSINGS) ×2 IMPLANT
ELECT BLADE 4.0 EZ CLEAN MEGAD (MISCELLANEOUS)
ELECT BLADE 6.5 .24CM SHAFT (ELECTRODE) IMPLANT
ELECT COATED BLADE 2.86 ST (ELECTRODE) ×2 IMPLANT
ELECT REM PT RETURN 9FT ADLT (ELECTROSURGICAL) ×2
ELECTRODE BLDE 4.0 EZ CLN MEGD (MISCELLANEOUS) IMPLANT
ELECTRODE REM PT RTRN 9FT ADLT (ELECTROSURGICAL) ×1 IMPLANT
EVACUATOR SILICONE 100CC (DRAIN) ×1 IMPLANT
GAUZE SPONGE 4X4 12PLY STRL (GAUZE/BANDAGES/DRESSINGS) IMPLANT
GLOVE BIO SURGEON STRL SZ 6 (GLOVE) ×2 IMPLANT
GLOVE BIO SURGEON STRL SZ 6.5 (GLOVE) IMPLANT
GLOVE BIOGEL PI IND STRL 7.0 (GLOVE) IMPLANT
GLOVE BIOGEL PI INDICATOR 7.0 (GLOVE) ×3
GLOVE ECLIPSE 6.5 STRL STRAW (GLOVE) ×2 IMPLANT
GOWN STRL REUS W/ TWL LRG LVL3 (GOWN DISPOSABLE) ×2 IMPLANT
GOWN STRL REUS W/TWL LRG LVL3 (GOWN DISPOSABLE) ×6
LIQUID BAND (GAUZE/BANDAGES/DRESSINGS) ×2 IMPLANT
NDL HYPO 25X1 1.5 SAFETY (NEEDLE) IMPLANT
NEEDLE HYPO 25X1 1.5 SAFETY (NEEDLE) IMPLANT
NS IRRIG 1000ML POUR BTL (IV SOLUTION) ×1 IMPLANT
PACK BASIN DAY SURGERY FS (CUSTOM PROCEDURE TRAY) ×2 IMPLANT
PENCIL BUTTON HOLSTER BLD 10FT (ELECTRODE) ×2 IMPLANT
PIN SAFETY STERILE (MISCELLANEOUS) ×1 IMPLANT
SHEET MEDIUM DRAPE 40X70 STRL (DRAPES) IMPLANT
SLEEVE SCD COMPRESS KNEE MED (MISCELLANEOUS) ×2 IMPLANT
SPONGE GAUZE 4X4 12PLY STER LF (GAUZE/BANDAGES/DRESSINGS) IMPLANT
SPONGE LAP 18X18 X RAY DECT (DISPOSABLE) ×4 IMPLANT
STAPLER VISISTAT 35W (STAPLE) ×1 IMPLANT
STRIP CLOSURE SKIN 1/2X4 (GAUZE/BANDAGES/DRESSINGS) ×1 IMPLANT
SUT ETHILON 2 0 FS 18 (SUTURE) ×1 IMPLANT
SUT ETHILON 4 0 PS 2 18 (SUTURE) ×1 IMPLANT
SUT MNCRL AB 4-0 PS2 18 (SUTURE) ×2 IMPLANT
SUT VIC AB 3-0 PS1 18 (SUTURE) ×4
SUT VIC AB 3-0 PS1 18XBRD (SUTURE) ×1 IMPLANT
SUT VICRYL 4-0 PS2 18IN ABS (SUTURE) ×2 IMPLANT
SWAB COLLECTION DEVICE MRSA (MISCELLANEOUS) IMPLANT
SYR 50ML LL SCALE MARK (SYRINGE) IMPLANT
SYR BULB IRRIGATION 50ML (SYRINGE) ×2 IMPLANT
SYR CONTROL 10ML LL (SYRINGE) IMPLANT
TOWEL OR 17X24 6PK STRL BLUE (TOWEL DISPOSABLE) ×5 IMPLANT
TUBE ANAEROBIC SPECIMEN COL (MISCELLANEOUS) IMPLANT
TUBE CONNECTING 20X1/4 (TUBING) ×2 IMPLANT
UNDERPAD 30X30 (UNDERPADS AND DIAPERS) ×3 IMPLANT
YANKAUER SUCT BULB TIP NO VENT (SUCTIONS) ×2 IMPLANT

## 2014-10-24 NOTE — Transfer of Care (Signed)
Immediate Anesthesia Transfer of Care Note  Patient: Deanna Buck  Procedure(s) Performed: Procedure(s): REMOVAL LEFT BREAST IMPLANT, Excision of Nipple Areola Left Breast (Left)  Patient Location: PACU  Anesthesia Type:General  Level of Consciousness: awake, alert , oriented and patient cooperative  Airway & Oxygen Therapy: Patient Spontanous Breathing and Patient connected to face mask oxygen  Post-op Assessment: Report given to RN and Post -op Vital signs reviewed and stable  Post vital signs: Reviewed and stable  Last Vitals:  Filed Vitals:   10/24/14 1122  BP: 144/60  Pulse: 69  Temp: 36.8 C  Resp: 18    Complications: No apparent anesthesia complications

## 2014-10-24 NOTE — Anesthesia Procedure Notes (Signed)
Procedure Name: LMA Insertion Date/Time: 10/24/2014 12:54 PM Performed by: Atha Muradyan D Pre-anesthesia Checklist: Patient identified, Emergency Drugs available, Suction available and Patient being monitored Patient Re-evaluated:Patient Re-evaluated prior to inductionOxygen Delivery Method: Circle System Utilized Preoxygenation: Pre-oxygenation with 100% oxygen Intubation Type: IV induction Ventilation: Mask ventilation without difficulty LMA: LMA inserted LMA Size: 4.0 Number of attempts: 1 Airway Equipment and Method: Bite block Placement Confirmation: positive ETCO2 Tube secured with: Tape Dental Injury: Teeth and Oropharynx as per pre-operative assessment

## 2014-10-24 NOTE — Anesthesia Preprocedure Evaluation (Addendum)
Anesthesia Evaluation  Patient identified by MRN, date of birth, ID band Patient awake    Reviewed: Allergy & Precautions, NPO status , Patient's Chart, lab work & pertinent test results  Airway Mallampati: I  TM Distance: >3 FB Neck ROM: Full    Dental   Pulmonary neg pulmonary ROS,    breath sounds clear to auscultation       Cardiovascular hypertension, Pt. on medications  Rhythm:Regular Rate:Normal     Neuro/Psych PSYCHIATRIC DISORDERS Anxiety negative neurological ROS     GI/Hepatic Neg liver ROS, GERD  Medicated,  Endo/Other  negative endocrine ROS  Renal/GU negative Renal ROS     Musculoskeletal  (+) Arthritis ,   Abdominal   Peds  Hematology  (+) anemia ,   Anesthesia Other Findings   Reproductive/Obstetrics                            Anesthesia Physical Anesthesia Plan  ASA: II  Anesthesia Plan: General   Post-op Pain Management:    Induction: Intravenous  Airway Management Planned: LMA  Additional Equipment:   Intra-op Plan:   Post-operative Plan:   Informed Consent: I have reviewed the patients History and Physical, chart, labs and discussed the procedure including the risks, benefits and alternatives for the proposed anesthesia with the patient or authorized representative who has indicated his/her understanding and acceptance.     Plan Discussed with: CRNA  Anesthesia Plan Comments:         Anesthesia Quick Evaluation

## 2014-10-24 NOTE — Op Note (Addendum)
Operative Note   DATE OF OPERATION: 10.11.2016  LOCATION: [facility] - outpatient  SURGICAL DIVISION: Plastic Surgery  PREOPERATIVE DIAGNOSES:  1. History left breast cancer 2. Acquired absence breast left 3. History therapeutic radiation 4.  Exposure breast implant  POSTOPERATIVE DIAGNOSES:  same  PROCEDURE:  1. Removal left breast implant with excision mastectomy flap   SURGEON: [name] MD MBA  ASSISTANT: none  ANESTHESIA:  General.   EBL: 30 ml  COMPLICATIONS: None immediate.   INDICATIONS FOR PROCEDURE:  The patient, Deanna Buck, is a 69 y.o. female born on 24-Aug-1945, is here for removal of left breast implant and excision of mastectomy flap skin. Patient has undergone prior nipple sparing mastectomy. Course complicated by necrosis portion mastectomy flap skin requiring debridement and replacement tissue expander. She underwent implant exchange 6 months following completion adjuvant radiation. She is now 5 months post operative from implant exchange. She presented 1 week ago with pinpoint opening in mastectomy flap with serous drainage. This failed primary closure. We have discussed options including removal implant vs. Latissimus flap to salvage reconstruction. She declines latter at this time. We have agreed to resect all redundant mastectomy flap tissue including nipple areolar complex (NAC) to aid with eventual prosthesis wearing.    FINDINGS: 2-3 mm full thickness hole upper outer quadrant reconstructed breast, very attenuated mastectomy flap.  DESCRIPTION OF PROCEDURE:  The patient's operative site was marked with the patient in the preoperative area. The patient was taken to the operating room. SCDs were placed and IV antibiotics were given. The patient's operative site was prepped and draped in a sterile fashion. A time out was performed and all information was confirmed to be correct.  Transverse elliptical excision of left reconstructed breast  mastectomy flap including NAC completed. Implant removed. Any non adherent acellular dermis removed. Capsulotomies performed and subcutaneous fat excised medially and over lateral chest wall to aid with creating flat contour. The wound over upper outer quadrant was treated with sharp excision of skin margins and layered closure with 4-0 vicryl in dermis and 4-0 nylon for skin closure. 15 Fr JP placed in cavity and secured to skin with 2-0 nylon. Transverse chest incision closed with 3-0 and 4-0 vicryl to approximate capsule and dermis. Skin closure completed with 4-0 monocryl subcuticular skin closure. Tissue adhesive applied over all incisions. Steri strips applied over upper outer quadrant repair. Dry dressing and breast binder applied.   The patient was allowed to wake from anesthesia, extubated and taken to the recovery room in satisfactory condition.   SPECIMENS: left mastectomy flap  DRAINS: 15 Fr JP in left subcutaneous chest  [name], MD [facility] [phone]

## 2014-10-24 NOTE — Interval H&P Note (Signed)
History and Physical Interval Note:  10/24/2014 7:02 AM  Deanna Buck  has presented today for surgery, with the diagnosis of OPEN WOUND LEFT CHEST HX OF LEFT BREAST CANCER ACQUIRED ABSENCE BREAST HX OF RADIATION   The various methods of treatment have been discussed with the patient and family. After consideration of risks, benefits and other options for treatment, the patient has consented to  Procedure(s): REMOVAL LEFT BREAST IMPLANT  (Left) as a surgical intervention .  The patient's history has been reviewed, patient examined, no change in status, stable for surgery.  I have reviewed the patient's chart and labs.  Questions were answered to the patient's satisfaction.     Gustaf Mccarter

## 2014-10-24 NOTE — Anesthesia Postprocedure Evaluation (Signed)
  Anesthesia Post-op Note  Patient: Deanna Buck  Procedure(s) Performed: Procedure(s): REMOVAL LEFT BREAST IMPLANT, Excision of Nipple Areola Left Breast (Left)  Patient Location: PACU  Anesthesia Type: General   Level of Consciousness: awake, alert  and oriented  Airway and Oxygen Therapy: Patient Spontanous Breathing  Post-op Pain: mild  Post-op Assessment: Post-op Vital signs reviewed  Post-op Vital Signs: Reviewed  Last Vitals:  Filed Vitals:   10/24/14 1611  BP: 152/68  Pulse: 78  Temp: 36.8 C  Resp: 16    Complications: No apparent anesthesia complications

## 2014-10-24 NOTE — Discharge Instructions (Signed)

## 2014-10-25 ENCOUNTER — Encounter (HOSPITAL_BASED_OUTPATIENT_CLINIC_OR_DEPARTMENT_OTHER): Payer: Self-pay | Admitting: Plastic Surgery

## 2014-10-25 ENCOUNTER — Telehealth: Payer: Self-pay

## 2014-10-25 DIAGNOSIS — C50412 Malignant neoplasm of upper-outer quadrant of left female breast: Secondary | ICD-10-CM

## 2014-10-25 DIAGNOSIS — E876 Hypokalemia: Secondary | ICD-10-CM

## 2014-10-25 MED ORDER — POTASSIUM CHLORIDE ER 10 MEQ PO TBCR: 20.0000 meq | EXTENDED_RELEASE_TABLET | Freq: Every day | ORAL | Status: DC

## 2014-10-25 NOTE — Telephone Encounter (Signed)
Per Dr. Jana Hakim, patient's potassium is low at 3.2, he would like patient to take KCL 20 meq daily.  She is requesting that the supplement be sent to her [facility]. Patient also wanted Dr. Jana Hakim to know that she had surgery yesterday.  She states that she had drainage coming from her left breast and the implant had to be removed.  Patient feels like "it was a botch job right from the beginning" .

## 2014-11-15 DIAGNOSIS — Z9012 Acquired absence of left breast and nipple: Secondary | ICD-10-CM | POA: Insufficient documentation

## 2014-11-17 ENCOUNTER — Ambulatory Visit (INDEPENDENT_AMBULATORY_CARE_PROVIDER_SITE_OTHER): Payer: BLUE CROSS/BLUE SHIELD

## 2014-11-17 DIAGNOSIS — Z23 Encounter for immunization: Secondary | ICD-10-CM

## 2015-04-24 ENCOUNTER — Other Ambulatory Visit: Payer: Self-pay | Admitting: Internal Medicine

## 2015-04-24 DIAGNOSIS — Z853 Personal history of malignant neoplasm of breast: Secondary | ICD-10-CM

## 2015-05-02 ENCOUNTER — Other Ambulatory Visit: Payer: Self-pay

## 2015-05-02 DIAGNOSIS — C50412 Malignant neoplasm of upper-outer quadrant of left female breast: Secondary | ICD-10-CM

## 2015-05-03 ENCOUNTER — Other Ambulatory Visit (HOSPITAL_BASED_OUTPATIENT_CLINIC_OR_DEPARTMENT_OTHER): Payer: BLUE CROSS/BLUE SHIELD

## 2015-05-03 DIAGNOSIS — Z853 Personal history of malignant neoplasm of breast: Secondary | ICD-10-CM | POA: Diagnosis not present

## 2015-05-03 DIAGNOSIS — C50412 Malignant neoplasm of upper-outer quadrant of left female breast: Secondary | ICD-10-CM

## 2015-05-03 LAB — CBC WITH DIFFERENTIAL/PLATELET
BASO%: 1 % (ref 0.0–2.0)
Basophils Absolute: 0.1 10*3/uL (ref 0.0–0.1)
EOS ABS: 0.2 10*3/uL (ref 0.0–0.5)
EOS%: 2.5 % (ref 0.0–7.0)
HCT: 39 % (ref 34.8–46.6)
HEMOGLOBIN: 13.1 g/dL (ref 11.6–15.9)
LYMPH%: 18.7 % (ref 14.0–49.7)
MCH: 29.1 pg (ref 25.1–34.0)
MCHC: 33.6 g/dL (ref 31.5–36.0)
MCV: 86.6 fL (ref 79.5–101.0)
MONO#: 0.7 10*3/uL (ref 0.1–0.9)
MONO%: 11.6 % (ref 0.0–14.0)
NEUT%: 66.2 % (ref 38.4–76.8)
NEUTROS ABS: 4.1 10*3/uL (ref 1.5–6.5)
Platelets: 172 10*3/uL (ref 145–400)
RBC: 4.5 10*6/uL (ref 3.70–5.45)
RDW: 13 % (ref 11.2–14.5)
WBC: 6.2 10*3/uL (ref 3.9–10.3)
lymph#: 1.2 10*3/uL (ref 0.9–3.3)

## 2015-05-03 LAB — COMPREHENSIVE METABOLIC PANEL
ALBUMIN: 3.4 g/dL — AB (ref 3.5–5.0)
ALK PHOS: 92 U/L (ref 40–150)
ALT: 11 U/L (ref 0–55)
ANION GAP: 7 meq/L (ref 3–11)
AST: 19 U/L (ref 5–34)
BILIRUBIN TOTAL: 0.81 mg/dL (ref 0.20–1.20)
BUN: 11.4 mg/dL (ref 7.0–26.0)
CALCIUM: 9.8 mg/dL (ref 8.4–10.4)
CO2: 26 mEq/L (ref 22–29)
CREATININE: 0.8 mg/dL (ref 0.6–1.1)
Chloride: 110 mEq/L — ABNORMAL HIGH (ref 98–109)
EGFR: 81 mL/min/{1.73_m2} — AB (ref >90)
Glucose: 98 mg/dl (ref 70–140)
Potassium: 4 mEq/L (ref 3.5–5.1)
Sodium: 143 mEq/L (ref 136–145)
TOTAL PROTEIN: 6.9 g/dL (ref 6.4–8.3)

## 2015-05-10 ENCOUNTER — Ambulatory Visit (HOSPITAL_BASED_OUTPATIENT_CLINIC_OR_DEPARTMENT_OTHER): Payer: BLUE CROSS/BLUE SHIELD | Admitting: Oncology

## 2015-05-10 ENCOUNTER — Telehealth: Payer: Self-pay | Admitting: Oncology

## 2015-05-10 VITALS — BP 135/52 | HR 65 | Temp 98.5°F | Resp 18 | Ht 64.0 in | Wt 153.2 lb

## 2015-05-10 DIAGNOSIS — C50412 Malignant neoplasm of upper-outer quadrant of left female breast: Secondary | ICD-10-CM

## 2015-05-10 DIAGNOSIS — Z853 Personal history of malignant neoplasm of breast: Secondary | ICD-10-CM

## 2015-05-10 DIAGNOSIS — Z79810 Long term (current) use of selective estrogen receptor modulators (SERMs): Secondary | ICD-10-CM | POA: Diagnosis not present

## 2015-05-10 MED ORDER — TAMOXIFEN CITRATE 20 MG PO TABS: 20.0000 mg | ORAL_TABLET | Freq: Every day | ORAL | Status: DC

## 2015-05-10 NOTE — Telephone Encounter (Signed)
appt made and avs printed °

## 2015-05-10 NOTE — Progress Notes (Signed)
Deanna Buck  [phone] [phone]     ID: Deanna Buck DOB: 13-May-1945  MR#: [id number]  FWY#:[id number]  Patient Care Team: Biagio Borg, MD as PCP - General (Internal Medicine) Bobbye Charleston, MD as Consulting Physician (Obstetrics and Gynecology) Jarome Matin, MD as Consulting Physician (Dermatology) Chauncey Cruel, MD as Consulting Physician (Oncology) Rolm Bookbinder M.D., Gery Pray, Arnoldo Hooker [place]  CHIEF COMPLAINT: Early stage multifocal breast cancer  CURRENT TREATMENT:  tamoxifen    BREAST CANCER HISTORY: From the original intake note:  [name] had routine yearly screening mammography with tomography 05/27/2013 at the [facility] Center, showing a possible area of distortion in the left breast. Left diagnostic mammography and ultrasonography 06/13/2013 showed the breast density to be category C. In the upper outer quadrant of the left breast there was a mass measuring approximately 1.8 cm. It was not palpable on exam. Ultrasound showed a hypoechoic irregular mass measuring 1.3 cm correlating with the mammographic finding. In addition, a smaller, 1.0 cm mass was detected approximately 3 cm from the index lesion. The left axilla was negative.  Biopsy of both masses in the left breast 06/22/2013 showed ([id number]) morphologically similar invasive mammary carcinoma, grade 1 or 2, estrogen receptor positive at 93%, progesterone receptor positive at 91%, both with strong staining intensity, with an MIB-1 of 11%, and no HER-2 amplification, the signals ratio being 1.03 and the number per cell 1.55.  On 06/29/2013 the patient underwent bilateral breast MRI at [facility]. By MRI the larger left breast mass measured 3.3 cm. The second, smaller mass measured 1.1 cm. Combined the masses measures 3.9 cm maximally. [By mammography, the masses were 2.4 cm apart with a combined diameter of 4.9 cm.]  The patient's subsequent history is as detailed  below  INTERVAL HISTORY: Deanna Buck returns today for followup of her estrogen receptor positive breast cancer accompanied by her husband [name].  She continues on tamoxifen, generally with excellent tolerance. Hot flashes are brief, they're to wake her up, and she does not feel a need to take any medication for that. She does not have significant problems with vaginal wetness or discharge. She obtains it at a good price.  REVIEW OF SYSTEMS: She  Has mild arthritis discomfort chiefly involving the left hip and left knee. This actually improves when she exercises. She does not walk regularly partly because of concerns regarding walking alone through the park. She is not wearing her 50s bit. She can be short of breath sometimes particularly walking upstairs. She gets to the top without stopping. She can have very minimal stress urinary incontinence at times. A detailed review of systems today was otherwise stable  PAST MEDICAL HISTORY: Past Medical History  Diagnosis Date  . ECZEMA   . ANXIETY   . GERD   . OA (osteoarthritis)   . Abnormal mammogram 05/2013    L breast  . Full dentures   . Wears contact lenses     also wears glasses  . Breast cancer ([place])     left breast  . Radiation 10/24/13-11/30/13    left chest wall and axilla  . Hypertension     Pt states she takes Nadolol for Anxiety, not BP    PAST SURGICAL HISTORY: Past Surgical History  Procedure Laterality Date  . 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 Breast lumpectomy  1982    rt and lt benign  .  Breast reconstruction with placement of tissue expander and flex hd (acellular hydrated dermis) Left 08/04/2013    Procedure: BREAST RECONSTRUCTION WITH PLACEMENT OF TISSUE EXPANDER AND  POSSIBLE FLEX HD (ACELLULAR HYDRATED DERMIS);  Surgeon: [name], MD;  Location: [facility];  Service: Plastics;  Laterality: Left;  . Incision and drainage of wound  Left 08/30/2013    Procedure: DEBRIDEMENT OF LEFT BREAST;  Surgeon: [name], MD;  Location: [facility];  Service: Plastics;  Laterality: Left;  Marland Kitchen Mass excision Right 05/30/2014    Procedure: EXCISION OF RIGHT AXILLARY LIPOMA;  Surgeon: [name], MD;  Location: [facility];  Service: Plastics;  Laterality: Right;  . Removal of tissue expander and placement of implant Left 05/30/2014    Procedure: REMOVAL OF LEFT TISSUE EXPANDER AND PLACEMENT OF IMPLANT;  Surgeon: [name], MD;  Location: [facility];  Service: Plastics;  Laterality: Left;  . Liposuction with lipofilling Left 05/30/2014    Procedure: LIPOFILLING TO LEFT CHEST;  Surgeon: [name], MD;  Location: [facility];  Service: Plastics;  Laterality: Left;  Marland Kitchen Mastopexy Right 05/30/2014    Procedure: RIGHT MASTOPEXY FOR SYMMETRY;  Surgeon: [name], MD;  Location: [facility];  Service: Plastics;  Laterality: Right;  . Breast implant removal Left 10/24/2014    Procedure: REMOVAL LEFT BREAST IMPLANT, Excision of Nipple Areola Left Breast;  Surgeon: [name], MD;  Location: [facility];  Service: Plastics;  Laterality: Left;    FAMILY HISTORY Family History  Problem Relation Age of Onset  . Breast cancer Mother   . Dementia Mother   . Arthritis Other   . Hypertension Other     parent  . Hyperlipidemia Other     parent  . Kidney disease Other     parent   the patient's father died at the age of 53 with congestive heart failure. The patient's mother died at the age of 22. She was diagnosed with breast cancer at the age of 41. The only other breast cancer in the family was on the opposite, father's side, a first cousin diagnosed in her 54s. The patient had one brother who died from a myocardial infarction. She had no sisters.  GYNECOLOGIC HISTORY:  No LMP recorded. Patient is postmenopausal. Menarche age 26,  first live birth age 71, the patient is Deanna Buck. She underwent menopause approximately 2002. She did not take hormone replacement. She did take birth control pills remotely for approximately 13 years, with no complications.  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 Her children from a prior marriage are Michaela Corner, who is an Optometrist, and Lavon Paganini, who is also an Optometrist. Both live in [place]. The patient's husband Annalaura Sauseda work for [employer]. The patient has 2 grandchildren. She is not a Ambulance person.    ADVANCED DIRECTIVES: In place   HEALTH MAINTENANCE: Social History  Substance Use Topics  . Smoking status: Never Smoker   . Smokeless tobacco: Not on file     Comment: Married, but in stressful relationship with spouse  . Alcohol Use: Yes     Comment:  rare mixed drink     Colonoscopy:  PAP:  Bone density: 03/31/2011 ad lib. are showing significant osteopenia, with the lowest T score at -2.4  Lipid panel:  Allergies  Allergen Reactions  . Morphine And Related Shortness Of Breath  . Sulfonamide Derivatives Hives and Itching  . Banana Other (  See Comments)    Pain  . Penicillins Other (See Comments)    Dr. Sherolyn Buba told her she was allergic to PCN.    Current Outpatient Prescriptions  Medication Sig Dispense Refill  . acetaminophen (TYLENOL) 650 MG CR tablet Take 650 mg by mouth every 8 (eight) hours as needed for pain.    Marland Kitchen esomeprazole (NEXIUM) 40 MG capsule Take 40 mg by mouth as needed.     . hydrochlorothiazide (HYDRODIURIL) 25 MG tablet Take 1 tablet (25 mg total) by mouth daily. 90 tablet 3  . nadolol (CORGARD) 40 MG tablet Take 0.5 tablets (20 mg total) by mouth daily. 45 tablet 3  . non-metallic deodorant (ALRA) MISC Apply 1 application topically daily as needed.    Marland Kitchen oxyCODONE (ROXICODONE) 5 MG immediate release tablet Take 1 tablet (5 mg total) by mouth every 4 (four) hours as needed for severe pain. 30 tablet 0  .  potassium chloride (K-DUR) 10 MEQ tablet Take 2 tablets (20 mEq total) by mouth daily. 60 tablet 4  . tamoxifen (NOLVADEX) 20 MG tablet Take 1 tablet (20 mg total) by mouth daily. 90 tablet 4  . valACYclovir (VALTREX) 1000 MG tablet Take 1 tablet (1,000 mg total) by mouth daily as needed. 30 tablet 0  . vitamin B-12 (CYANOCOBALAMIN) 100 MCG tablet Take 50 mcg by mouth as needed.      No current facility-administered medications for this visit.    OBJECTIVE:  Middle-aged white woman who appears well Filed Vitals:   05/10/15 1055  BP: 135/52  Pulse: 65  Temp: 98.5 F (36.9 C)  Resp: 18     Body mass index is 26.28 kg/(m^2).    ECOG FS:1 - Symptomatic but completely ambulatory  Sclerae unicteric, EOMs intact Oropharynx clear, dentition in good repair No cervical or supraclavicular adenopathy Lungs no rales or rhonchi Heart regular rate and rhythm Abd soft, nontender, positive bowel sounds MSK no focal spinal tenderness, no upper extremity lymphedema Neuro: nonfocal, well oriented, appropriate affect Breasts: the right breast is status post reduction mammoplasty. There are no suspicious masses and no skin or nipple changes of concern. The left breast is status post mastectomy. There is no evidence of chest wall recurrence. The left axilla is.    LAB RESULTS:  CMP     Component Value Date/Time   NA 143 05/03/2015 1100   NA 141 10/24/2014 1144   K 4.0 05/03/2015 1100   K 3.2* 10/24/2014 1144   CL 104 10/24/2014 1144   CO2 26 05/03/2015 1100   CO2 30 08/03/2014 1144   GLUCOSE 98 05/03/2015 1100   GLUCOSE 106* 10/24/2014 1144   BUN 11.4 05/03/2015 1100   BUN 12 10/24/2014 1144   CREATININE 0.8 05/03/2015 1100   CREATININE 0.70 10/24/2014 1144   CALCIUM 9.8 05/03/2015 1100   CALCIUM 9.9 08/03/2014 1144   PROT 6.9 05/03/2015 1100   PROT 7.0 08/03/2014 1144   ALBUMIN 3.4* 05/03/2015 1100   ALBUMIN 3.8 08/03/2014 1144   AST 19 05/03/2015 1100   AST 16 08/03/2014 1144   ALT  11 05/03/2015 1100   ALT 9 08/03/2014 1144   ALKPHOS 92 05/03/2015 1100   ALKPHOS 95 08/03/2014 1144   BILITOT 0.81 05/03/2015 1100   BILITOT 0.6 08/03/2014 1144   GFRNONAA >60 05/26/2014 1100   GFRAA >60 05/26/2014 1100    I No results found for: SPEP  Lab Results  Component Value Date   WBC 6.2 05/03/2015   NEUTROABS 4.1  05/03/2015   HGB 13.1 05/03/2015   HCT 39.0 05/03/2015   MCV 86.6 05/03/2015   PLT 172 05/03/2015      Chemistry      Component Value Date/Time   NA 143 05/03/2015 1100   NA 141 10/24/2014 1144   K 4.0 05/03/2015 1100   K 3.2* 10/24/2014 1144   CL 104 10/24/2014 1144   CO2 26 05/03/2015 1100   CO2 30 08/03/2014 1144   BUN 11.4 05/03/2015 1100   BUN 12 10/24/2014 1144   CREATININE 0.8 05/03/2015 1100   CREATININE 0.70 10/24/2014 1144      Component Value Date/Time   CALCIUM 9.8 05/03/2015 1100   CALCIUM 9.9 08/03/2014 1144   ALKPHOS 92 05/03/2015 1100   ALKPHOS 95 08/03/2014 1144   AST 19 05/03/2015 1100   AST 16 08/03/2014 1144   ALT 11 05/03/2015 1100   ALT 9 08/03/2014 1144   BILITOT 0.81 05/03/2015 1100   BILITOT 0.6 08/03/2014 1144       No results found for: LABCA2  No components found for: [id number]  No results for input(s): INR in the last 168 hours.  Urinalysis    Component Value Date/Time   COLORURINE YELLOW 08/03/2014 1144   APPEARANCEUR CLEAR 08/03/2014 1144   LABSPEC 1.010 08/03/2014 1144   PHURINE 6.5 08/03/2014 1144   GLUCOSEU NEGATIVE 08/03/2014 1144   GLUCOSEU NEGATIVE 12/26/2008 1615   HGBUR NEGATIVE 08/03/2014 1144   HGBUR negative 09/11/2009 1413   BILIRUBINUR NEGATIVE 08/03/2014 1144   BILIRUBINUR neg 02/17/2011 1625   KETONESUR NEGATIVE 08/03/2014 1144   PROTEINUR neg 02/17/2011 1625   PROTEINUR NEGATIVE 12/26/2008 1615   UROBILINOGEN 0.2 08/03/2014 1144   UROBILINOGEN 0.2 02/17/2011 1625   NITRITE NEGATIVE 08/03/2014 1144   NITRITE pos 02/17/2011 1625   LEUKOCYTESUR SMALL* 08/03/2014 1144     STUDIES:repeat mammography scheduled for 05/30/2015 As well as ASSESSMENT: 70 y.o. [place] woman status post biopsy of 2 separate left breast masses 06/22/2013 for a clinical mT1c N0, stage IA carcinoma, grade 1 or 2, estrogen receptor 93% positive, progesterone receptor 91% positive, with an MIB-1 of 11%, and no HER-2 amplification  (1) status post left mastectomy and sentinel lymph node sampling 08/04/2013 for a pT2 pN1, stage IIB invasive ductal carcinoma, grade 2, with negative margins, and repeat HER-2 again negative  (2) Oncotype score of 13 predicts a risk of recurrence or mortality within 5 years of 10% with tamoxifen alone, and 11% with tamoxifen plus chemotherapy; the patient was offered chemotherapy vs. participation in S007, but decided against both  (3) tamoxifen started 09/22/2013  (4) radiation  completed 11/30/2013  Left chest wall/reconstructed area/axilla -45 gray in 25 fraction  Additional boost to the left chest wall/reconstructed area 5.4 gray for chemotherapy dose of 50.4 gray  (5)  Completed reconstruction 05/30/2014, namely: 1. Removal left tissue expander and placement silicone implant 2. Lipofilling to left chest 3. Acellular dermis for breast reconstruction left, total 50 cm2 4. Right mastopexy for symmetry 5. Excision right axillary lipoma 6 cm  PLAN: Elodie is now nearly 2 years out from definitive surgery for her breast cancer with no evidence of disease recurrence. This is very favorable  She is tolerating the tamoxifen well. We are going to continue that through the next visit here, which will be May 2018. At that time we will consider switching to anastrozole  In preparation for that's which she will have a vitamin D level and a bone density before the May 2018  visit.  We discussed diet issues today and she understands that there is a lot of minutes, full poor, and superstition, but very little knowledge regarding the optimal diet for  cancer. In general we can say that the more vegetables the better. She understands that an occasional glass of wine may slightly increase the risk of breast cancer, but it will decrease mortality from other causes" balances out".  I think it would be helpful for her if she walked more regularly. I gave her information on the [facility] program.  Otherwise she will see me again in a year. She knows to call for any problems that may develop before that visit   MAGRINAT,GUSTAV C, MD   05/10/2015 11:08 AM

## 2015-05-30 ENCOUNTER — Ambulatory Visit
Admission: RE | Admit: 2015-05-30 | Discharge: 2015-05-30 | Disposition: A | Discharge status: Home / Self Care | Payer: BLUE CROSS/BLUE SHIELD | Source: Ambulatory Visit | Attending: Internal Medicine | Admitting: Internal Medicine

## 2015-05-30 DIAGNOSIS — Z853 Personal history of malignant neoplasm of breast: Secondary | ICD-10-CM

## 2015-06-18 ENCOUNTER — Other Ambulatory Visit: Payer: Self-pay | Admitting: Internal Medicine

## 2015-07-29 ENCOUNTER — Other Ambulatory Visit: Payer: Self-pay | Admitting: Internal Medicine

## 2015-07-30 ENCOUNTER — Other Ambulatory Visit (INDEPENDENT_AMBULATORY_CARE_PROVIDER_SITE_OTHER): Payer: BLUE CROSS/BLUE SHIELD

## 2015-07-30 DIAGNOSIS — Z0000 Encounter for general adult medical examination without abnormal findings: Secondary | ICD-10-CM | POA: Diagnosis not present

## 2015-07-30 LAB — URINALYSIS, ROUTINE W REFLEX MICROSCOPIC
Bilirubin Urine: NEGATIVE
HGB URINE DIPSTICK: NEGATIVE
KETONES UR: NEGATIVE
LEUKOCYTES UA: NEGATIVE
Nitrite: NEGATIVE
RBC / HPF: NONE SEEN (ref 0–?)
Specific Gravity, Urine: 1.01 (ref 1.000–1.030)
Total Protein, Urine: NEGATIVE
URINE GLUCOSE: NEGATIVE
Urobilinogen, UA: 0.2 (ref 0.0–1.0)
pH: 6 (ref 5.0–8.0)

## 2015-07-30 LAB — BASIC METABOLIC PANEL
BUN: 17 mg/dL (ref 6–23)
CO2: 29 meq/L (ref 19–32)
Calcium: 9.6 mg/dL (ref 8.4–10.5)
Chloride: 105 mEq/L (ref 96–112)
Creatinine, Ser: 0.69 mg/dL (ref 0.40–1.20)
GFR: 89.56 mL/min (ref >60.00)
GLUCOSE: 95 mg/dL (ref 70–99)
POTASSIUM: 4 meq/L (ref 3.5–5.1)
SODIUM: 139 meq/L (ref 135–145)

## 2015-07-30 LAB — LIPID PANEL
CHOL/HDL RATIO: 3
Cholesterol: 184 mg/dL (ref 0–200)
HDL: 54.8 mg/dL (ref >39.00)
LDL Cholesterol: 92 mg/dL (ref 0–99)
NONHDL: 128.74
Triglycerides: 182 mg/dL — ABNORMAL HIGH (ref 0.0–149.0)
VLDL: 36.4 mg/dL (ref 0.0–40.0)

## 2015-07-30 LAB — HEPATIC FUNCTION PANEL
ALBUMIN: 3.8 g/dL (ref 3.5–5.2)
ALK PHOS: 85 U/L (ref 39–117)
ALT: 12 U/L (ref 0–35)
AST: 18 U/L (ref 0–37)
BILIRUBIN DIRECT: 0.1 mg/dL (ref 0.0–0.3)
TOTAL PROTEIN: 6.9 g/dL (ref 6.0–8.3)
Total Bilirubin: 0.9 mg/dL (ref 0.2–1.2)

## 2015-07-30 LAB — TSH: TSH: 1.96 u[IU]/mL (ref 0.35–4.50)

## 2015-07-31 LAB — CBC WITH DIFFERENTIAL/PLATELET
Basophils Absolute: 0.1 10*3/uL (ref 0.0–0.1)
Basophils Relative: 0.7 % (ref 0.0–3.0)
EOS PCT: 1.5 % (ref 0.0–5.0)
Eosinophils Absolute: 0.1 10*3/uL (ref 0.0–0.7)
HCT: 37.6 % (ref 36.0–46.0)
Hemoglobin: 12.7 g/dL (ref 12.0–15.0)
LYMPHS ABS: 1.3 10*3/uL (ref 0.7–4.0)
Lymphocytes Relative: 14.9 % (ref 12.0–46.0)
MCHC: 33.7 g/dL (ref 30.0–36.0)
MCV: 87.2 fl (ref 78.0–100.0)
MONO ABS: 0.8 10*3/uL (ref 0.1–1.0)
Monocytes Relative: 9 % (ref 3.0–12.0)
NEUTROS ABS: 6.4 10*3/uL (ref 1.4–7.7)
NEUTROS PCT: 73.9 % (ref 43.0–77.0)
PLATELETS: 183 10*3/uL (ref 150.0–400.0)
RBC: 4.31 Mil/uL (ref 3.87–5.11)
RDW: 13.1 % (ref 11.5–15.5)
WBC: 8.7 10*3/uL (ref 4.0–10.5)

## 2015-08-01 ENCOUNTER — Ambulatory Visit (INDEPENDENT_AMBULATORY_CARE_PROVIDER_SITE_OTHER): Payer: BLUE CROSS/BLUE SHIELD | Admitting: Internal Medicine

## 2015-08-01 ENCOUNTER — Encounter: Payer: Self-pay | Admitting: Internal Medicine

## 2015-08-01 VITALS — BP 130/80 | HR 64 | Temp 98.3°F | Resp 20 | Wt 155.0 lb

## 2015-08-01 DIAGNOSIS — E785 Hyperlipidemia, unspecified: Secondary | ICD-10-CM

## 2015-08-01 DIAGNOSIS — Z0001 Encounter for general adult medical examination with abnormal findings: Secondary | ICD-10-CM

## 2015-08-01 DIAGNOSIS — Z0000 Encounter for general adult medical examination without abnormal findings: Secondary | ICD-10-CM | POA: Diagnosis not present

## 2015-08-01 DIAGNOSIS — Z13820 Encounter for screening for osteoporosis: Secondary | ICD-10-CM

## 2015-08-01 DIAGNOSIS — I10 Essential (primary) hypertension: Secondary | ICD-10-CM | POA: Diagnosis not present

## 2015-08-01 DIAGNOSIS — Z1159 Encounter for screening for other viral diseases: Secondary | ICD-10-CM

## 2015-08-01 NOTE — Assessment & Plan Note (Signed)
Improved with diet alone, pt has declined statin at last visit Lab Results  Component Value Date   [place] 92 07/30/2015

## 2015-08-01 NOTE — Progress Notes (Signed)
Subjective:    Patient ID: Deanna Buck, female    DOB: 1945/05/17, 70 y.o.   MRN: [id number]  HPI  Here for wellness and f/u;  Overall doing ok;  Pt denies Chest pain, worsening SOB, DOE, wheezing, orthopnea, PND, worsening LE edema, palpitations, dizziness or syncope.  Pt denies neurological change such as new headache, facial or extremity weakness.  Pt denies polydipsia, polyuria, or low sugar symptoms. Pt states overall good compliance with treatment and medications, good tolerability, and has been trying to follow appropriate diet.  Pt denies worsening depressive symptoms, suicidal ideation or panic. No fever, night sweats, wt loss, loss of appetite, or other constitutional symptoms.  Pt states good ability with ADL's, has low fall risk, home safety reviewed and adequate, no other significant changes in hearing or vision, and only occasionally active with exercise. Wt Readings from Last 3 Encounters:  08/01/15 155 lb (70.308 kg)  05/10/15 153 lb 3.2 oz (69.491 kg)  10/24/14 152 lb (68.947 kg)   BP Readings from Last 3 Encounters:  08/01/15 130/80  05/10/15 135/52  10/24/14 152/68  Due for DXA., to see GYN in sept 2017 as well.   Past Medical History  Diagnosis Date  . ECZEMA   . ANXIETY   . GERD   . OA (osteoarthritis)   . Abnormal mammogram 05/2013    L breast  . Full dentures   . Wears contact lenses     also wears glasses  . Breast cancer ([place])     left breast  . Radiation 10/24/13-11/30/13    left chest wall and axilla  . Hypertension     Pt states she takes Nadolol for Anxiety, not BP   Past Surgical History  Procedure Laterality Date  . Kidney repair      [phone] for a congenital malformation, had a second breast biopsy (R) in 2007  . Breast lumpectomy  1982    rt and lt benign  . Breast reconstruction with placement of tissue expander and flex hd (acellular hydrated dermis) Left 08/04/2013    Procedure: BREAST RECONSTRUCTION WITH PLACEMENT OF TISSUE EXPANDER AND   POSSIBLE FLEX HD (ACELLULAR HYDRATED DERMIS);  Surgeon: [name], MD;  Location: [facility];  Service: Plastics;  Laterality: Left;  . Incision and drainage of wound Left 08/30/2013    Procedure: DEBRIDEMENT OF LEFT BREAST;  Surgeon: [name], MD;  Location: [facility];  Service: Plastics;  Laterality: Left;  Marland Kitchen Mass excision Right 05/30/2014    Procedure: EXCISION OF RIGHT AXILLARY LIPOMA;  Surgeon: [name], MD;  Location: [place] [facility];  Service: Plastics;  Laterality: Right;  . Removal of tissue expander and placement of implant Left 05/30/2014    Procedure: REMOVAL OF LEFT TISSUE EXPANDER AND PLACEMENT OF IMPLANT;  Surgeon: [name], MD;  Location: [place];  Service: Plastics;  Laterality: Left;  . Liposuction with lipofilling Left 05/30/2014    Procedure: LIPOFILLING TO LEFT CHEST;  Surgeon: [name], MD;  Location: [place];  Service: Plastics;  Laterality: Left;  Marland Kitchen Mastopexy Right 05/30/2014    Procedure: RIGHT MASTOPEXY FOR SYMMETRY;  Surgeon: [name], MD;  Location: [place];  Service: Plastics;  Laterality: Right;  . Breast implant removal Left 10/24/2014    Procedure: REMOVAL LEFT BREAST IMPLANT, Excision of Nipple Areola Left Breast;  Surgeon: [name], MD;  Location: [place];  Service: Plastics;  Laterality: Left;  reports that she has never smoked. She does not have any smokeless tobacco history on file. She reports that she drinks alcohol. She reports that she does not use illicit drugs. family history includes Arthritis in her other; Breast cancer in her mother; Dementia in her mother; Hyperlipidemia in her other; Hypertension in her other; Kidney disease in her other. Allergies  Allergen Reactions  . Morphine And Related Shortness Of Breath  . Sulfonamide Derivatives Hives and Itching  . Banana Other (See Comments)      Pain  . Penicillins Other (See Comments)    Dr. Sherolyn Buba told her she was allergic to PCN.   Current Outpatient Prescriptions on File Prior to Visit  Medication Sig Dispense Refill  . esomeprazole (NEXIUM) 40 MG capsule Take 40 mg by mouth as needed.     . hydrochlorothiazide (HYDRODIURIL) 25 MG tablet Take 1 tablet (25 mg total) by mouth daily. Yearly physical due in July must see md for refills 90 tablet 0  . nadolol (CORGARD) 40 MG tablet Take 0.5 tablets (20 mg total) by mouth daily. Overdue for appt must see MD for future refills 45 tablet 0  . non-metallic deodorant (ALRA) MISC Apply 1 application topically daily as needed.    . potassium chloride (K-DUR) 10 MEQ tablet Take 2 tablets (20 mEq total) by mouth daily. 60 tablet 4  . tamoxifen (NOLVADEX) 20 MG tablet Take 1 tablet (20 mg total) by mouth daily. 90 tablet 4  . valACYclovir (VALTREX) 1000 MG tablet Take 1 tablet (1,000 mg total) by mouth daily as needed. 30 tablet 0  . vitamin B-12 (CYANOCOBALAMIN) 100 MCG tablet Take 50 mcg by mouth as needed.      No current facility-administered medications on file prior to visit.   Review of Systems Constitutional: Negative for increased diaphoresis, or other activity, appetite or siginficant weight change other than noted HENT: Negative for worsening hearing loss, ear pain, facial swelling, mouth sores and neck stiffness.   Eyes: Negative for other worsening pain, redness or visual disturbance.  Respiratory: Negative for choking or stridor Cardiovascular: Negative for other chest pain and palpitations.  Gastrointestinal: Negative for worsening diarrhea, blood in stool, or abdominal distention Genitourinary: Negative for hematuria, flank pain or change in urine volume.  Musculoskeletal: Negative for myalgias or other joint complaints.  Skin: Negative for other color change and wound or drainage.  Neurological: Negative for syncope and numbness. other than noted Hematological:  Negative for adenopathy. or other swelling Psychiatric/Behavioral: Negative for hallucinations, SI, self-injury, decreased concentration or other worsening agitation.      Objective:   Physical Exam BP 130/80 mmHg  Pulse 64  Temp(Src) 98.3 F (36.8 C) (Oral)  Resp 20  Wt 155 lb (70.308 kg)  SpO2 97% VS noted,  Constitutional: Pt is oriented to person, place, and time. Appears well-developed and well-nourished, in no significant distress Head: Normocephalic and atraumatic  Eyes: Conjunctivae and EOM are normal. Pupils are equal, round, and reactive to light Right Ear: External ear normal.  Left Ear: External ear normal Nose: Nose normal.  Mouth/Throat: Oropharynx is clear and moist  Neck: Normal range of motion. Neck supple. No JVD present. No tracheal deviation present or significant neck LA or mass Has left breast prosthesis Cardiovascular: Normal rate, regular rhythm, normal heart sounds and intact distal pulses.   Pulmonary/Chest: Effort normal and breath sounds without rales or wheezing  Abdominal: Soft. Bowel sounds are normal. NT. No HSM  Musculoskeletal: Normal range of motion. Exhibits  no edema Lymphadenopathy: Has no cervical adenopathy.  Neurological: Pt is alert and oriented to person, place, and time. Pt has normal reflexes. No cranial nerve deficit. Motor grossly intact Skin: Skin is warm and dry. No rash noted or new ulcers Psychiatric:  Has normal mood and affect. Behavior is normal.     Assessment & Plan:

## 2015-08-01 NOTE — Progress Notes (Signed)
Pre visit review using our clinic review tool, if applicable. No additional management support is needed unless otherwise documented below in the visit note. 

## 2015-08-01 NOTE — Patient Instructions (Addendum)
Please continue all other medications as before, and refills have been done if requested.  Please have the pharmacy call with any other refills you may need.  Please continue your efforts at being more active, low cholesterol diet, and weight control.  You are otherwise up to date with prevention measures today.  Please keep your appointments with your specialists as you may have planned  Please schedule the bone density test before leaving today at the scheduling desk (where you check out)  Please return in 1 year for your yearly visit, or sooner if needed, with Lab testing done 3-5 days before   

## 2015-08-01 NOTE — Assessment & Plan Note (Signed)
stable overall by history and exam, recent data reviewed with pt, and pt to continue medical treatment as before,  to f/u any worsening symptoms or concerns BP Readings from Last 3 Encounters:  08/01/15 130/80  05/10/15 135/52  10/24/14 152/68

## 2015-08-01 NOTE — Assessment & Plan Note (Signed)

## 2015-08-20 ENCOUNTER — Other Ambulatory Visit: Payer: Self-pay | Admitting: Oncology

## 2015-08-20 DIAGNOSIS — C50412 Malignant neoplasm of upper-outer quadrant of left female breast: Secondary | ICD-10-CM

## 2015-08-20 DIAGNOSIS — E876 Hypokalemia: Secondary | ICD-10-CM

## 2015-08-21 ENCOUNTER — Other Ambulatory Visit: Payer: Self-pay | Admitting: Oncology

## 2015-08-21 DIAGNOSIS — C50412 Malignant neoplasm of upper-outer quadrant of left female breast: Secondary | ICD-10-CM

## 2015-08-21 DIAGNOSIS — E876 Hypokalemia: Secondary | ICD-10-CM

## 2015-09-17 ENCOUNTER — Other Ambulatory Visit: Payer: Self-pay | Admitting: Oncology

## 2015-09-24 ENCOUNTER — Other Ambulatory Visit: Payer: Self-pay | Admitting: Internal Medicine

## 2015-10-01 ENCOUNTER — Other Ambulatory Visit: Payer: Self-pay | Admitting: Internal Medicine

## 2015-10-11 ENCOUNTER — Ambulatory Visit (INDEPENDENT_AMBULATORY_CARE_PROVIDER_SITE_OTHER): Payer: BLUE CROSS/BLUE SHIELD

## 2015-10-11 DIAGNOSIS — Z23 Encounter for immunization: Secondary | ICD-10-CM | POA: Diagnosis not present

## 2015-10-28 ENCOUNTER — Other Ambulatory Visit: Payer: Self-pay | Admitting: Internal Medicine

## 2015-11-14 ENCOUNTER — Other Ambulatory Visit: Payer: Self-pay | Admitting: Obstetrics and Gynecology

## 2015-11-21 ENCOUNTER — Encounter: Payer: Self-pay | Admitting: Oncology

## 2016-01-10 ENCOUNTER — Other Ambulatory Visit: Payer: Self-pay | Admitting: *Deleted

## 2016-01-10 DIAGNOSIS — C50412 Malignant neoplasm of upper-outer quadrant of left female breast: Secondary | ICD-10-CM

## 2016-01-10 DIAGNOSIS — E876 Hypokalemia: Secondary | ICD-10-CM

## 2016-01-10 MED ORDER — POTASSIUM CHLORIDE ER 10 MEQ PO TBCR: 20.0000 meq | EXTENDED_RELEASE_TABLET | Freq: Every day | ORAL | 1 refills | Status: DC

## 2016-03-17 ENCOUNTER — Other Ambulatory Visit: Payer: Self-pay | Admitting: Emergency Medicine

## 2016-03-17 DIAGNOSIS — E876 Hypokalemia: Secondary | ICD-10-CM

## 2016-03-17 DIAGNOSIS — C50412 Malignant neoplasm of upper-outer quadrant of left female breast: Secondary | ICD-10-CM

## 2016-03-17 MED ORDER — POTASSIUM CHLORIDE ER 10 MEQ PO TBCR: 20.0000 meq | EXTENDED_RELEASE_TABLET | Freq: Every day | ORAL | 1 refills | Status: DC

## 2016-03-18 ENCOUNTER — Other Ambulatory Visit: Payer: Self-pay | Admitting: Oncology

## 2016-03-18 DIAGNOSIS — C50412 Malignant neoplasm of upper-outer quadrant of left female breast: Secondary | ICD-10-CM

## 2016-03-18 DIAGNOSIS — E876 Hypokalemia: Secondary | ICD-10-CM

## 2016-04-16 ENCOUNTER — Other Ambulatory Visit: Payer: Self-pay | Admitting: Oncology

## 2016-04-16 ENCOUNTER — Other Ambulatory Visit: Payer: Self-pay | Admitting: Internal Medicine

## 2016-04-16 DIAGNOSIS — Z9012 Acquired absence of left breast and nipple: Secondary | ICD-10-CM

## 2016-04-16 DIAGNOSIS — Z1231 Encounter for screening mammogram for malignant neoplasm of breast: Secondary | ICD-10-CM

## 2016-04-30 ENCOUNTER — Other Ambulatory Visit: Payer: Self-pay | Admitting: Internal Medicine

## 2016-05-02 ENCOUNTER — Telehealth: Payer: Self-pay | Admitting: Internal Medicine

## 2016-05-02 NOTE — Telephone Encounter (Signed)
Patient called in saying she was thinking about going with [facility] mail order for her rx hydrochlorothiazide 25mg  for May because they informed her if she got 90 day supply it could be free for her. I informed patient it looked like a 90 day was sent in on 4/4 [facility]. She states she only got a 30day. She is going to follow up with her pharmacy because she is going to need an rx for May from what she is saying. And if you could follow up and make sure I did not miss something here. Patient was informed Dr. Jenny Reichmann would not be back into office until Tuesday and she most likely will not here anything until then. Thank you.

## 2016-05-02 NOTE — Telephone Encounter (Signed)
Called pt but I had to leave a detailed VM. I informed her that I checked with the [facility] about the hydrochlorothiazide and they did receive the 90 day supply that was sent in on 4/4 but her insurance will only cover 30 days at a time, however, she does have 2 refills. The 2 refills will cover the next 2 months for it to total 90 pills at the end. I also mentioned that I added the [facility] pharmacy to her chart and that any other med refills she would need would start being sent there as requested. I also reiterated that if she did call back with any concerns that it wouldn't be until Tuesday that she would get a response.

## 2016-05-15 ENCOUNTER — Other Ambulatory Visit: Payer: Self-pay | Admitting: Oncology

## 2016-05-15 DIAGNOSIS — C50412 Malignant neoplasm of upper-outer quadrant of left female breast: Secondary | ICD-10-CM

## 2016-05-15 DIAGNOSIS — E876 Hypokalemia: Secondary | ICD-10-CM

## 2016-05-21 ENCOUNTER — Other Ambulatory Visit: Payer: Self-pay | Admitting: *Deleted

## 2016-05-21 MED ORDER — HYDROCHLOROTHIAZIDE 25 MG PO TABS: 25.0000 mg | ORAL_TABLET | Freq: Every day | ORAL | 0 refills | Status: DC

## 2016-05-26 ENCOUNTER — Ambulatory Visit (INDEPENDENT_AMBULATORY_CARE_PROVIDER_SITE_OTHER)
Admission: RE | Admit: 2016-05-26 | Discharge: 2016-05-26 | Disposition: A | Discharge status: Home / Self Care | Payer: BLUE CROSS/BLUE SHIELD | Source: Ambulatory Visit | Attending: Internal Medicine | Admitting: Internal Medicine

## 2016-05-26 DIAGNOSIS — Z13820 Encounter for screening for osteoporosis: Secondary | ICD-10-CM | POA: Diagnosis not present

## 2016-05-27 DIAGNOSIS — Z13820 Encounter for screening for osteoporosis: Secondary | ICD-10-CM | POA: Diagnosis not present

## 2016-06-02 ENCOUNTER — Other Ambulatory Visit (HOSPITAL_BASED_OUTPATIENT_CLINIC_OR_DEPARTMENT_OTHER): Payer: BLUE CROSS/BLUE SHIELD

## 2016-06-02 DIAGNOSIS — C50412 Malignant neoplasm of upper-outer quadrant of left female breast: Secondary | ICD-10-CM

## 2016-06-02 DIAGNOSIS — Z853 Personal history of malignant neoplasm of breast: Secondary | ICD-10-CM

## 2016-06-02 LAB — COMPREHENSIVE METABOLIC PANEL
ALBUMIN: 3.5 g/dL (ref 3.5–5.0)
ALK PHOS: 96 U/L (ref 40–150)
ALT: 14 U/L (ref 0–55)
AST: 20 U/L (ref 5–34)
Anion Gap: 7 mEq/L (ref 3–11)
BILIRUBIN TOTAL: 1.07 mg/dL (ref 0.20–1.20)
BUN: 12.7 mg/dL (ref 7.0–26.0)
CALCIUM: 9.6 mg/dL (ref 8.4–10.4)
CO2: 27 mEq/L (ref 22–29)
CREATININE: 0.8 mg/dL (ref 0.6–1.1)
Chloride: 109 mEq/L (ref 98–109)
EGFR: 81 mL/min/{1.73_m2} — ABNORMAL LOW (ref >90)
GLUCOSE: 98 mg/dL (ref 70–140)
POTASSIUM: 4.1 meq/L (ref 3.5–5.1)
SODIUM: 143 meq/L (ref 136–145)
TOTAL PROTEIN: 6.6 g/dL (ref 6.4–8.3)

## 2016-06-02 LAB — CBC WITH DIFFERENTIAL/PLATELET
BASO%: 0.3 % (ref 0.0–2.0)
BASOS ABS: 0 10*3/uL (ref 0.0–0.1)
EOS%: 2.8 % (ref 0.0–7.0)
Eosinophils Absolute: 0.2 10*3/uL (ref 0.0–0.5)
HCT: 38.2 % (ref 34.8–46.6)
HGB: 12.6 g/dL (ref 11.6–15.9)
LYMPH%: 22.3 % (ref 14.0–49.7)
MCH: 29.4 pg (ref 25.1–34.0)
MCHC: 33 g/dL (ref 31.5–36.0)
MCV: 89.3 fL (ref 79.5–101.0)
MONO#: 0.7 10*3/uL (ref 0.1–0.9)
MONO%: 11.5 % (ref 0.0–14.0)
NEUT%: 63.1 % (ref 38.4–76.8)
NEUTROS ABS: 4 10*3/uL (ref 1.5–6.5)
Platelets: 171 10*3/uL (ref 145–400)
RBC: 4.28 10*6/uL (ref 3.70–5.45)
RDW: 12.8 % (ref 11.2–14.5)
WBC: 6.4 10*3/uL (ref 3.9–10.3)
lymph#: 1.4 10*3/uL (ref 0.9–3.3)

## 2016-06-03 ENCOUNTER — Ambulatory Visit
Admission: RE | Admit: 2016-06-03 | Discharge: 2016-06-03 | Disposition: A | Discharge status: Home / Self Care | Payer: BLUE CROSS/BLUE SHIELD | Source: Ambulatory Visit | Attending: Oncology | Admitting: Oncology

## 2016-06-03 ENCOUNTER — Other Ambulatory Visit: Payer: Self-pay | Admitting: Oncology

## 2016-06-03 DIAGNOSIS — Z1231 Encounter for screening mammogram for malignant neoplasm of breast: Secondary | ICD-10-CM

## 2016-06-03 DIAGNOSIS — R921 Mammographic calcification found on diagnostic imaging of breast: Secondary | ICD-10-CM

## 2016-06-03 DIAGNOSIS — Z9012 Acquired absence of left breast and nipple: Secondary | ICD-10-CM

## 2016-06-04 ENCOUNTER — Telehealth: Payer: Self-pay

## 2016-06-04 ENCOUNTER — Other Ambulatory Visit: Payer: Self-pay | Admitting: Oncology

## 2016-06-04 ENCOUNTER — Other Ambulatory Visit: Payer: Self-pay | Admitting: Internal Medicine

## 2016-06-04 DIAGNOSIS — R928 Other abnormal and inconclusive findings on diagnostic imaging of breast: Secondary | ICD-10-CM

## 2016-06-04 MED ORDER — ALENDRONATE SODIUM 70 MG PO TABS: 70.0000 mg | ORAL_TABLET | ORAL | 3 refills | Status: DC

## 2016-06-04 NOTE — Telephone Encounter (Signed)
-----   Message from Biagio Borg, MD sent at 06/04/2016  1:11 PM EDT ----- Left message on MyChart, pt to cont same tx except  The test results show that your current treatment is OK, except the bone density is very close to osteoporosis.  We should.start fosamax 70 weekly to help reduce the risk of falls with bone fractures.  A new prescription will be sent, and you should hear from the office about this as well.    Shirron to please inform pt, I will do rx

## 2016-06-04 NOTE — Telephone Encounter (Signed)
Informed pt and she expressed understanding.  

## 2016-06-06 ENCOUNTER — Telehealth: Payer: Self-pay | Admitting: *Deleted

## 2016-06-06 MED ORDER — HYDROCHLOROTHIAZIDE 25 MG PO TABS
25.0000 mg | ORAL_TABLET | Freq: Every day | ORAL | 0 refills | Status: DC
Start: 2016-06-06 — End: 2017-02-04

## 2016-06-06 NOTE — Telephone Encounter (Signed)
Rec'd call pt states she has not receive mail order yet for her HCTZ only have 2 pills left. Wanting to see if a short term [facility]. Inform pt will send 2 week supply to pharmacy...Deanna Buck

## 2016-06-09 ENCOUNTER — Ambulatory Visit: Payer: BLUE CROSS/BLUE SHIELD | Admitting: Oncology

## 2016-06-09 ENCOUNTER — Other Ambulatory Visit: Payer: BLUE CROSS/BLUE SHIELD

## 2016-06-10 ENCOUNTER — Ambulatory Visit
Admission: RE | Admit: 2016-06-10 | Discharge: 2016-06-10 | Disposition: A | Discharge status: Home / Self Care | Payer: BLUE CROSS/BLUE SHIELD | Source: Ambulatory Visit | Attending: Oncology | Admitting: Oncology

## 2016-06-10 ENCOUNTER — Other Ambulatory Visit: Payer: Self-pay | Admitting: Oncology

## 2016-06-10 DIAGNOSIS — R921 Mammographic calcification found on diagnostic imaging of breast: Secondary | ICD-10-CM

## 2016-06-10 DIAGNOSIS — R928 Other abnormal and inconclusive findings on diagnostic imaging of breast: Secondary | ICD-10-CM

## 2016-06-13 ENCOUNTER — Other Ambulatory Visit: Payer: Self-pay | Admitting: Oncology

## 2016-06-13 ENCOUNTER — Ambulatory Visit
Admission: RE | Admit: 2016-06-13 | Discharge: 2016-06-13 | Disposition: A | Discharge status: Home / Self Care | Payer: BLUE CROSS/BLUE SHIELD | Source: Ambulatory Visit | Attending: Oncology | Admitting: Oncology

## 2016-06-13 DIAGNOSIS — R921 Mammographic calcification found on diagnostic imaging of breast: Secondary | ICD-10-CM

## 2016-06-16 ENCOUNTER — Other Ambulatory Visit: Payer: Self-pay | Admitting: Oncology

## 2016-06-16 DIAGNOSIS — C50412 Malignant neoplasm of upper-outer quadrant of left female breast: Secondary | ICD-10-CM

## 2016-06-16 DIAGNOSIS — E876 Hypokalemia: Secondary | ICD-10-CM

## 2016-06-17 ENCOUNTER — Ambulatory Visit (HOSPITAL_BASED_OUTPATIENT_CLINIC_OR_DEPARTMENT_OTHER): Payer: BLUE CROSS/BLUE SHIELD | Admitting: Adult Health

## 2016-06-17 ENCOUNTER — Encounter: Payer: Self-pay | Admitting: Adult Health

## 2016-06-17 VITALS — BP 138/64 | HR 64 | Temp 98.3°F | Resp 18 | Ht 64.0 in | Wt 159.9 lb

## 2016-06-17 DIAGNOSIS — Z170 Estrogen receptor positive status [ER+]: Secondary | ICD-10-CM

## 2016-06-17 DIAGNOSIS — C50412 Malignant neoplasm of upper-outer quadrant of left female breast: Secondary | ICD-10-CM

## 2016-06-17 DIAGNOSIS — Z853 Personal history of malignant neoplasm of breast: Secondary | ICD-10-CM

## 2016-06-17 DIAGNOSIS — M8580 Other specified disorders of bone density and structure, unspecified site: Secondary | ICD-10-CM | POA: Diagnosis not present

## 2016-06-17 DIAGNOSIS — E876 Hypokalemia: Secondary | ICD-10-CM

## 2016-06-17 MED ORDER — TAMOXIFEN CITRATE 20 MG PO TABS: 20.0000 mg | ORAL_TABLET | Freq: Every day | ORAL | 4 refills | Status: DC

## 2016-06-17 MED ORDER — POTASSIUM CHLORIDE ER 10 MEQ PO TBCR: 20.0000 meq | EXTENDED_RELEASE_TABLET | Freq: Every day | ORAL | 0 refills | Status: DC

## 2016-06-17 NOTE — Progress Notes (Signed)
[facility]  Telephone:([phone] [phone]) [phone]     ID: Deanna Buck DOB: Sep 19, 1945  MR#: [id number]  DXI#:[id number]  Patient Care Team: Biagio Borg, MD as PCP - General (Internal Medicine) Bobbye Charleston, MD as Consulting Physician (Obstetrics and Gynecology) Jarome Matin, MD as Consulting Physician (Dermatology) Magrinat, Virgie Dad, MD as Consulting Physician (Oncology) Rolm Bookbinder M.D., Gery Pray, Arnoldo Hooker [place]  CHIEF COMPLAINT: Early stage multifocal breast cancer  CURRENT TREATMENT:  tamoxifen    BREAST CANCER HISTORY: From the original intake note:  Deanna Buck had routine yearly screening mammography with tomography 05/27/2013 at the [facility] Center, showing a possible area of distortion in the left breast. Left diagnostic mammography and ultrasonography 06/13/2013 showed the breast density to be category C. In the upper outer quadrant of the left breast there was a mass measuring approximately 1.8 cm. It was not palpable on exam. Ultrasound showed a hypoechoic irregular mass measuring 1.3 cm correlating with the mammographic finding. In addition, a smaller, 1.0 cm mass was detected approximately 3 cm from the index lesion. The left axilla was negative.  Biopsy of both masses in the left breast 06/22/2013 showed ([id number]) morphologically similar invasive mammary carcinoma, grade 1 or 2, estrogen receptor positive at 93%, progesterone receptor positive at 91%, both with strong staining intensity, with an MIB-1 of 11%, and no HER-2 amplification, the signals ratio being 1.03 and the number per cell 1.55.  On 06/29/2013 the patient underwent bilateral breast MRI at [facility]. By MRI the larger left breast mass measured 3.3 cm. The second, smaller mass measured 1.1 cm. Combined the masses measures 3.9 cm maximally. [By mammography, the masses were 2.4 cm apart with a combined diameter of 4.9 cm.]  The patient's subsequent history is as  detailed below  INTERVAL HISTORY: Deanna Buck is doing well today.  She is taking Tamoxifen daily.  She is not having any difficulties with taking this.  She has not had hysterectomy.  She has no vaginal bleeding.  She was prescribed Fosamax by Dr. Jenny Reichmann for osteopenia and is not yet ready to take it due to the side effects.  She does not want to take Anastrozole due to the effects on the bones and likelihood of needing bisphosphanate therapy.  She sees a gynecologist annually.    REVIEW OF SYSTEMS: A detailed ROS was conducted and is non contributory unless noted above.    PAST MEDICAL HISTORY: Past Medical History:  Diagnosis Date  . Abnormal mammogram 05/2013   L breast  . ANXIETY   . Breast cancer ([place])    left breast  . ECZEMA   . Full dentures   . GERD   . Hypertension    Pt states she takes Nadolol for Anxiety, not BP  . OA (osteoarthritis)   . Radiation 10/24/13-11/30/13   left chest wall and axilla  . Wears contact lenses    also wears glasses    PAST SURGICAL HISTORY: Past Surgical History:  Procedure Laterality Date  . BREAST IMPLANT REMOVAL Left 10/24/2014   Procedure: REMOVAL LEFT BREAST IMPLANT, Excision of Nipple Areola Left Breast;  Surgeon: [name], MD;  Location: [facility];  Service: Plastics;  Laterality: Left;  . BREAST LUMPECTOMY  1982   rt and lt benign  . BREAST RECONSTRUCTION WITH PLACEMENT OF TISSUE EXPANDER AND FLEX HD (ACELLULAR HYDRATED DERMIS) Left 08/04/2013   Procedure: BREAST RECONSTRUCTION WITH PLACEMENT OF TISSUE EXPANDER AND  POSSIBLE FLEX HD (ACELLULAR HYDRATED DERMIS);  Surgeon: Arnoldo Hooker  Iran Planas, MD;  Location: [facility];  Service: Plastics;  Laterality: Left;  . INCISION AND DRAINAGE OF WOUND Left 08/30/2013   Procedure: DEBRIDEMENT OF LEFT BREAST;  Surgeon: [name], MD;  Location: [facility];  Service: Plastics;  Laterality: Left;  . 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 LIPOSUCTION WITH LIPOFILLING Left 05/30/2014   Procedure: LIPOFILLING TO LEFT CHEST;  Surgeon: [name], MD;  Location: [facility];  Service: Plastics;  Laterality: Left;  Marland Kitchen MASS EXCISION Right 05/30/2014   Procedure: EXCISION OF RIGHT AXILLARY LIPOMA;  Surgeon: [name], MD;  Location: [facility];  Service: Plastics;  Laterality: Right;  . MASTECTOMY Left   . MASTOPEXY Right 05/30/2014   Procedure: RIGHT MASTOPEXY FOR SYMMETRY;  Surgeon: [name], MD;  Location: [facility];  Service: Plastics;  Laterality: Right;  . REDUCTION MAMMAPLASTY Right 1980  . REMOVAL OF TISSUE EXPANDER AND PLACEMENT OF IMPLANT Left 05/30/2014   Procedure: REMOVAL OF LEFT TISSUE EXPANDER AND PLACEMENT OF IMPLANT;  Surgeon: [name], MD;  Location: [place];  Service: Plastics;  Laterality: Left;    FAMILY HISTORY Family History  Problem Relation Age of Onset  . Breast cancer Mother   . Dementia Mother   . Arthritis Other   . Hypertension Other        parent  . Hyperlipidemia Other        parent  . Kidney disease Other        parent   the patient's father died at the age of 1 with congestive heart failure. The patient's mother died at the age of 30. She was diagnosed with breast cancer at the age of 3. The only other breast cancer in the family was on the opposite, father's side, a first cousin diagnosed in her 43s. The patient had one brother who died from a myocardial infarction. She had no sisters.  GYNECOLOGIC HISTORY:  No LMP recorded. Patient is postmenopausal. Menarche age 28, first live birth age 49, the patient is [place] P2. She underwent menopause approximately 2002. She did not take hormone replacement. She did take birth control pills remotely for approximately 13 years, with no complications.  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 Her  children from a prior marriage are Deanna Buck, who is an Optometrist, and Deanna Buck, who is also an Optometrist. Both live in [place]. The patient's husband Deanna Buck work for [employer]. The patient has 2 grandchildren. She is not a Ambulance person.    ADVANCED DIRECTIVES: In place   HEALTH MAINTENANCE: Social History  Substance Use Topics  . Smoking status: Never Smoker  . Smokeless tobacco: Not on file     Comment: Married, but in stressful relationship with spouse  . Alcohol use Yes     Comment:  rare mixed drink     Colonoscopy: declines  PAP:  Bone density: osteopenia per patient report  Lipid panel:  Allergies  Allergen Reactions  . Morphine And Related Shortness Of Breath  . Sulfonamide Derivatives Hives and Itching  . Banana Other (See Comments)    Pain  . Penicillins Other (See Comments)    Dr. Sherolyn Buba told her she was allergic to PCN.    Current Outpatient Prescriptions  Medication Sig Dispense Refill  . Cholecalciferol (VITAMIN D) 2000 units tablet Take 2,000 Units by mouth  daily.    . esomeprazole (NEXIUM) 40 MG capsule Take 40 mg by mouth as needed.     . hydrochlorothiazide (HYDRODIURIL) 25 MG tablet Take 1 tablet (25 mg total) by mouth daily. 14 tablet 0  . nadolol (CORGARD) 40 MG tablet Take 0.5 tablets (20 mg total) by mouth daily. 45 tablet 2  . potassium chloride (K-DUR) 10 MEQ tablet TAKE 2 TABLETS BY MOUTH DAILY 60 tablet 0  . tamoxifen (NOLVADEX) 20 MG tablet TAKE 1 TABLET DAILY 90 tablet 4  . alendronate (FOSAMAX) 70 MG tablet Take 1 tablet (70 mg total) by mouth every 7 (seven) days. Take with a full glass of water on an empty stomach. (Patient not taking: Reported on 06/17/2016) 12 tablet 3  . non-metallic deodorant (ALRA) MISC Apply 1 application topically daily as needed.    . valACYclovir (VALTREX) 1000 MG tablet Take 1 tablet (1,000 mg total) by mouth daily as needed. (Patient not taking: Reported on 06/17/2016) 30 tablet 0  .  vitamin B-12 (CYANOCOBALAMIN) 100 MCG tablet Take 50 mcg by mouth as needed.      No current facility-administered medications for this visit.     OBJECTIVE:  Vitals:   06/17/16 0928  BP: (!) 165/62  Pulse: 64  Resp: 18  Temp: 98.3 F (36.8 C)     Body mass index is 27.45 kg/m.    ECOG FS:1 - Symptomatic but completely ambulatory GENERAL: Patient is a well appearing female in no acute distress HEENT:  Sclerae anicteric.  Oropharynx clear and moist. No ulcerations or evidence of oropharyngeal candidiasis. Neck is supple.  NODES:  No cervical, supraclavicular, or axillary lymphadenopathy palpated.  BREAST EXAM:  Left breast is surgically absent, no nodularities, skin changes or masses, right breast s/p mammoplasty, no nodules, masses, skin or nipple changes, benign bilateral breast exam LUNGS:  Clear to auscultation bilaterally.  No wheezes or rhonchi. HEART:  Regular rate and rhythm. No murmur appreciated. ABDOMEN:  Soft, nontender.  Positive, normoactive bowel sounds. No organomegaly palpated. MSK:  No focal spinal tenderness to palpation. Full range of motion bilaterally in the upper extremities. EXTREMITIES:  No peripheral edema.   SKIN:  Clear with no obvious rashes or skin changes. No nail dyscrasia. NEURO:  Nonfocal. Well oriented.  Appropriate affect.       LAB RESULTS:  CMP     Component Value Date/Time   NA 143 06/02/2016 1104   K 4.1 06/02/2016 1104   CL 105 07/30/2015 1016   CO2 27 06/02/2016 1104   GLUCOSE 98 06/02/2016 1104   BUN 12.7 06/02/2016 1104   CREATININE 0.8 06/02/2016 1104   CALCIUM 9.6 06/02/2016 1104   PROT 6.6 06/02/2016 1104   ALBUMIN 3.5 06/02/2016 1104   AST 20 06/02/2016 1104   ALT 14 06/02/2016 1104   ALKPHOS 96 06/02/2016 1104   BILITOT 1.07 06/02/2016 1104   GFRNONAA >60 05/26/2014 1100   GFRAA >60 05/26/2014 1100    I No results found for: SPEP  Lab Results  Component Value Date   WBC 6.4 06/02/2016   NEUTROABS 4.0  06/02/2016   HGB 12.6 06/02/2016   HCT 38.2 06/02/2016   MCV 89.3 06/02/2016   PLT 171 06/02/2016      Chemistry      Component Value Date/Time   NA 143 06/02/2016 1104   K 4.1 06/02/2016 1104   CL 105 07/30/2015 1016   CO2 27 06/02/2016 1104   BUN 12.7 06/02/2016 1104   CREATININE 0.8  06/02/2016 1104      Component Value Date/Time   CALCIUM 9.6 06/02/2016 1104   ALKPHOS 96 06/02/2016 1104   AST 20 06/02/2016 1104   ALT 14 06/02/2016 1104   BILITOT 1.07 06/02/2016 1104       No results found for: LABCA2  No components found for: LABCA125  No results for input(s): INR in the last 168 hours.  Urinalysis    Component Value Date/Time   COLORURINE YELLOW 07/30/2015 1016   APPEARANCEUR CLEAR 07/30/2015 1016   LABSPEC 1.010 07/30/2015 1016   PHURINE 6.0 07/30/2015 1016   GLUCOSEU NEGATIVE 07/30/2015 1016   HGBUR NEGATIVE 07/30/2015 1016   HGBUR negative 09/11/2009 1413   BILIRUBINUR NEGATIVE 07/30/2015 1016   BILIRUBINUR neg 02/17/2011 1625   KETONESUR NEGATIVE 07/30/2015 1016   PROTEINUR neg 02/17/2011 1625   PROTEINUR NEGATIVE 12/26/2008 1615   UROBILINOGEN 0.2 07/30/2015 1016   NITRITE NEGATIVE 07/30/2015 1016   LEUKOCYTESUR NEGATIVE 07/30/2015 1016    STUDIES:repeat mammography scheduled for 05/30/2015 As well as ASSESSMENT: 71 y.o. Deanna Buck woman status post biopsy of 2 separate left breast masses 06/22/2013 for a clinical mT1c N0, stage IA carcinoma, grade 1 or 2, estrogen receptor 93% positive, progesterone receptor 91% positive, with an MIB-1 of 11%, and no HER-2 amplification  (1) status post left mastectomy and sentinel lymph node sampling 08/04/2013 for a pT2 pN1, stage IIB invasive ductal carcinoma, grade 2, with negative margins, and repeat HER-2 again negative  (2) Oncotype score of 13 predicts a risk of recurrence or mortality within 5 years of 10% with tamoxifen alone, and 11% with tamoxifen plus chemotherapy; the patient was offered chemotherapy  vs. participation in S007, but decided against both  (3) tamoxifen started 09/22/2013  (4) radiation  completed 11/30/2013  Left chest wall/reconstructed area/axilla -45 gray in 25 fraction  Additional boost to the left chest wall/reconstructed area 5.4 gray for chemotherapy dose of 50.4 gray  (5)  Completed reconstruction 05/30/2014, namely: 1. Removal left tissue expander and placement silicone implant 2. Lipofilling to left chest 3. Acellular dermis for breast reconstruction left, total 50 cm2 4. Right mastopexy for symmetry 5. Excision right axillary lipoma 6 cm  PLAN:  Deanna Buck is doing well today.  She and I reviewed risks and benefits of Tamoxifen versus Anastrozole and due to bone density issues and her osteopenia she wants to stay on the Tamoxifen.  She does see a gynecologist annually and will continue to do so.  I sent off another year of Tamoxifen to her pharmacy.    We reviewed healthy diet, exercise in detail today.    She has not yet seen her surgeon, so I will request that he follow up with her in about 6 months.  She will return in one year to see Dr. Jana Hakim. She knows to call for any problems that may develop before that visit  A total of (30) minutes of face-to-face time was spent with this patient with greater than 50% of that time in counseling and care-coordination.   Scot Dock, NP   06/17/2016 9:43 AM

## 2016-06-23 ENCOUNTER — Other Ambulatory Visit: Payer: Self-pay | Admitting: Oncology

## 2016-06-23 ENCOUNTER — Encounter: Payer: Self-pay | Admitting: Oncology

## 2016-07-21 ENCOUNTER — Other Ambulatory Visit: Payer: Self-pay | Admitting: Oncology

## 2016-07-21 DIAGNOSIS — E876 Hypokalemia: Secondary | ICD-10-CM

## 2016-07-21 DIAGNOSIS — C50412 Malignant neoplasm of upper-outer quadrant of left female breast: Secondary | ICD-10-CM

## 2016-07-22 ENCOUNTER — Other Ambulatory Visit: Payer: Self-pay | Admitting: Oncology

## 2016-07-22 ENCOUNTER — Other Ambulatory Visit: Payer: Self-pay | Admitting: Internal Medicine

## 2016-07-22 DIAGNOSIS — C50412 Malignant neoplasm of upper-outer quadrant of left female breast: Secondary | ICD-10-CM

## 2016-07-22 DIAGNOSIS — E876 Hypokalemia: Secondary | ICD-10-CM

## 2016-07-31 ENCOUNTER — Other Ambulatory Visit: Payer: BLUE CROSS/BLUE SHIELD

## 2016-08-05 ENCOUNTER — Encounter: Payer: Self-pay | Admitting: Internal Medicine

## 2016-08-05 ENCOUNTER — Ambulatory Visit (INDEPENDENT_AMBULATORY_CARE_PROVIDER_SITE_OTHER): Payer: BLUE CROSS/BLUE SHIELD | Admitting: Internal Medicine

## 2016-08-05 ENCOUNTER — Other Ambulatory Visit (INDEPENDENT_AMBULATORY_CARE_PROVIDER_SITE_OTHER): Payer: BLUE CROSS/BLUE SHIELD

## 2016-08-05 VITALS — BP 146/86 | HR 63 | Ht 64.0 in | Wt 160.0 lb

## 2016-08-05 DIAGNOSIS — E876 Hypokalemia: Secondary | ICD-10-CM | POA: Diagnosis not present

## 2016-08-05 DIAGNOSIS — R7989 Other specified abnormal findings of blood chemistry: Secondary | ICD-10-CM | POA: Diagnosis not present

## 2016-08-05 DIAGNOSIS — I10 Essential (primary) hypertension: Secondary | ICD-10-CM | POA: Diagnosis not present

## 2016-08-05 DIAGNOSIS — Z0000 Encounter for general adult medical examination without abnormal findings: Secondary | ICD-10-CM

## 2016-08-05 LAB — CBC WITH DIFFERENTIAL/PLATELET
BASOS ABS: 0 10*3/uL (ref 0.0–0.1)
BASOS PCT: 0.6 % (ref 0.0–3.0)
EOS ABS: 0.1 10*3/uL (ref 0.0–0.7)
Eosinophils Relative: 1.8 % (ref 0.0–5.0)
HEMATOCRIT: 39.5 % (ref 36.0–46.0)
HEMOGLOBIN: 13.3 g/dL (ref 12.0–15.0)
LYMPHS PCT: 17.2 % (ref 12.0–46.0)
Lymphs Abs: 1.4 10*3/uL (ref 0.7–4.0)
MCHC: 33.7 g/dL (ref 30.0–36.0)
MCV: 88.5 fl (ref 78.0–100.0)
Monocytes Absolute: 1 10*3/uL (ref 0.1–1.0)
Monocytes Relative: 12.4 % — ABNORMAL HIGH (ref 3.0–12.0)
Neutro Abs: 5.4 10*3/uL (ref 1.4–7.7)
Neutrophils Relative %: 68 % (ref 43.0–77.0)
Platelets: 210 10*3/uL (ref 150.0–400.0)
RBC: 4.46 Mil/uL (ref 3.87–5.11)
RDW: 12.6 % (ref 11.5–15.5)
WBC: 8 10*3/uL (ref 4.0–10.5)

## 2016-08-05 LAB — LIPID PANEL
CHOL/HDL RATIO: 3
CHOLESTEROL: 219 mg/dL — AB (ref 0–200)
HDL: 63.2 mg/dL (ref >39.00)
NonHDL: 155.52
TRIGLYCERIDES: 218 mg/dL — AB (ref 0.0–149.0)
VLDL: 43.6 mg/dL — ABNORMAL HIGH (ref 0.0–40.0)

## 2016-08-05 LAB — HEPATIC FUNCTION PANEL
ALBUMIN: 3.9 g/dL (ref 3.5–5.2)
ALT: 11 U/L (ref 0–35)
AST: 17 U/L (ref 0–37)
Alkaline Phosphatase: 83 U/L (ref 39–117)
BILIRUBIN TOTAL: 0.9 mg/dL (ref 0.2–1.2)
Bilirubin, Direct: 0.1 mg/dL (ref 0.0–0.3)
TOTAL PROTEIN: 7 g/dL (ref 6.0–8.3)

## 2016-08-05 LAB — LDL CHOLESTEROL, DIRECT: LDL DIRECT: 120 mg/dL

## 2016-08-05 LAB — URINALYSIS, ROUTINE W REFLEX MICROSCOPIC
Bilirubin Urine: NEGATIVE
HGB URINE DIPSTICK: NEGATIVE
KETONES UR: NEGATIVE
NITRITE: NEGATIVE
RBC / HPF: NONE SEEN (ref 0–?)
Specific Gravity, Urine: 1.01 (ref 1.000–1.030)
TOTAL PROTEIN, URINE-UPE24: NEGATIVE
URINE GLUCOSE: NEGATIVE
UROBILINOGEN UA: 0.2 (ref 0.0–1.0)
pH: 7 (ref 5.0–8.0)

## 2016-08-05 LAB — BASIC METABOLIC PANEL
BUN: 12 mg/dL (ref 6–23)
CHLORIDE: 105 meq/L (ref 96–112)
CO2: 29 mEq/L (ref 19–32)
CREATININE: 0.7 mg/dL (ref 0.40–1.20)
Calcium: 10.1 mg/dL (ref 8.4–10.5)
GFR: 87.82 mL/min (ref >60.00)
GLUCOSE: 101 mg/dL — AB (ref 70–99)
Potassium: 3.9 mEq/L (ref 3.5–5.1)
Sodium: 140 mEq/L (ref 135–145)

## 2016-08-05 LAB — TSH: TSH: 1.39 u[IU]/mL (ref 0.35–4.50)

## 2016-08-05 MED ORDER — POTASSIUM CHLORIDE ER 10 MEQ PO TBCR: 20.0000 meq | EXTENDED_RELEASE_TABLET | Freq: Every day | ORAL | 3 refills | Status: DC

## 2016-08-05 MED ORDER — TRIAMCINOLONE ACETONIDE 0.1 % EX CREA: 1.0000 "application " | TOPICAL_CREAM | Freq: Two times a day (BID) | CUTANEOUS | 1 refills | Status: DC

## 2016-08-05 NOTE — Progress Notes (Signed)
Subjective:    Patient ID: Deanna Buck, female    DOB: August 15, 1945, 71 y.o.   MRN: [id number]  HPI  Here for wellness and f/u;  Overall doing ok;  Pt denies Chest pain, worsening SOB, DOE, wheezing, orthopnea, PND, worsening LE edema, palpitations, dizziness or syncope.  Pt denies neurological change such as new headache, facial or extremity weakness.  Pt denies polydipsia, polyuria, or low sugar symptoms. Pt states overall good compliance with treatment and medications, good tolerability, and has been trying to follow appropriate diet.  Pt denies worsening depressive symptoms, suicidal ideation or panic. No fever, night sweats, wt loss, loss of appetite, or other constitutional symptoms.  Pt states good ability with ADL's, has low fall risk, home safety reviewed and adequate, no other significant changes in hearing or vision, and only occasionally active with exercise  BP Readings from Last 3 Encounters:  08/05/16 (!) 146/86  06/17/16 138/64  08/01/15 130/80   Wt Readings from Last 3 Encounters:  08/05/16 160 lb (72.6 kg)  06/17/16 159 lb 14.4 oz (72.5 kg)  08/01/15 155 lb (70.3 kg)  Declines to take fosamax after reading online about the risk.  Tamoxifen may have helped with the density results, seems some improved.   Has fu appt with GYN in October.  S/p right breast bx neg in June 2018  Does also have eczema to left dorsal foot with itching, asks for tx Past Medical History:  Diagnosis Date  . Abnormal mammogram 05/2013   L breast  . ANXIETY   . Breast cancer ([place])    left breast  . ECZEMA   . Full dentures   . GERD   . Hypertension    Pt states she takes Nadolol for Anxiety, not BP  . OA (osteoarthritis)   . Radiation 10/24/13-11/30/13   left chest wall and axilla  . Wears contact lenses    also wears glasses   Past Surgical History:  Procedure Laterality Date  . BREAST IMPLANT REMOVAL Left 10/24/2014   Procedure: REMOVAL LEFT BREAST IMPLANT, Excision of Nipple Areola Left  Breast;  Surgeon: [name], MD;  Location: [place];  Service: Plastics;  Laterality: Left;  . BREAST LUMPECTOMY  1982   rt and lt benign  . BREAST RECONSTRUCTION WITH PLACEMENT OF TISSUE EXPANDER AND FLEX HD (ACELLULAR HYDRATED DERMIS) Left 08/04/2013   Procedure: BREAST RECONSTRUCTION WITH PLACEMENT OF TISSUE EXPANDER AND  POSSIBLE FLEX HD (ACELLULAR HYDRATED DERMIS);  Surgeon: [name], MD;  Location: [place];  Service: Plastics;  Laterality: Left;  . INCISION AND DRAINAGE OF WOUND Left 08/30/2013   Procedure: DEBRIDEMENT OF LEFT BREAST;  Surgeon: [name], MD;  Location: [place];  Service: Plastics;  Laterality: Left;  . Kidney repair     [phone] for a congenital malformation, had a second breast biopsy (R) in 2007  . LIPOSUCTION WITH LIPOFILLING Left 05/30/2014   Procedure: LIPOFILLING TO LEFT CHEST;  Surgeon: [name], MD;  Location: [place];  Service: Plastics;  Laterality: Left;  Marland Kitchen MASS EXCISION Right 05/30/2014   Procedure: EXCISION OF RIGHT AXILLARY LIPOMA;  Surgeon: [name], MD;  Location: [place];  Service: Plastics;  Laterality: Right;  . MASTECTOMY Left   . MASTOPEXY Right 05/30/2014   Procedure: RIGHT MASTOPEXY FOR SYMMETRY;  Surgeon: [name], MD;  Location: [place];  Service: Plastics;  Laterality: Right;  . REDUCTION MAMMAPLASTY Right 1980  . REMOVAL OF  TISSUE EXPANDER AND PLACEMENT OF IMPLANT Left 05/30/2014   Procedure: REMOVAL OF LEFT TISSUE EXPANDER AND PLACEMENT OF IMPLANT;  Surgeon: [name], MD;  Location: [place];  Service: Plastics;  Laterality: Left;    reports that she has never smoked. She has never used smokeless tobacco. She reports that she drinks alcohol. She reports that she does not use drugs. family history includes Arthritis in her other; Breast cancer in her mother; Dementia in her  mother; Hyperlipidemia in her other; Hypertension in her other; Kidney disease in her other. Allergies  Allergen Reactions  . Morphine And Related Shortness Of Breath  . Sulfonamide Derivatives Hives and Itching  . Banana Other (See Comments)    Pain  . Penicillins Other (See Comments)    Dr. Sherolyn Buba told her she was allergic to PCN.   Current Outpatient Prescriptions on File Prior to Visit  Medication Sig Dispense Refill  . Cholecalciferol (VITAMIN D) 2000 units tablet Take 2,000 Units by mouth daily.    . hydrochlorothiazide (HYDRODIURIL) 25 MG tablet Take 1 tablet (25 mg total) by mouth daily. 14 tablet 0  . nadolol (CORGARD) 40 MG tablet Take 0.5 tablets (20 mg total) by mouth daily. 45 tablet 2  . non-metallic deodorant (ALRA) MISC Apply 1 application topically daily as needed.    . tamoxifen (NOLVADEX) 20 MG tablet Take 1 tablet (20 mg total) by mouth daily. 90 tablet 4  . valACYclovir (VALTREX) 1000 MG tablet Take 1 tablet (1,000 mg total) by mouth daily as needed. 30 tablet 0  . vitamin B-12 (CYANOCOBALAMIN) 100 MCG tablet Take 50 mcg by mouth as needed.      No current facility-administered medications on file prior to visit.    Review of Systems Constitutional: Negative for other unusual diaphoresis, sweats, appetite or weight changes HENT: Negative for other worsening hearing loss, ear pain, facial swelling, mouth sores or neck stiffness.   Eyes: Negative for other worsening pain, redness or other visual disturbance.  Respiratory: Negative for other stridor or swelling Cardiovascular: Negative for other palpitations or other chest pain  Gastrointestinal: Negative for worsening diarrhea or loose stools, blood in stool, distention or other pain Genitourinary: Negative for hematuria, flank pain or other change in urine volume.  Musculoskeletal: Negative for myalgias or other joint swelling.  Skin: Negative for other color change, or other wound or worsening drainage.    Neurological: Negative for other syncope or numbness. Hematological: Negative for other adenopathy or swelling Psychiatric/Behavioral: Negative for hallucinations, other worsening agitation, SI, self-injury, or new decreased concentration All other system neg per pt    Objective:   Physical Exam BP (!) 146/86   Pulse 63   Ht 5\' 4"  (1.626 m)   Wt 160 lb (72.6 kg)   SpO2 98%   BMI 27.46 kg/m  VS noted,  Constitutional: Pt is oriented to person, place, and time. Appears well-developed and well-nourished, in no significant distress and comfortable Head: Normocephalic and atraumatic  Eyes: Conjunctivae and EOM are normal. Pupils are equal, round, and reactive to light Right Ear: External ear normal without discharge Left Ear: External ear normal without discharge Nose: Nose without discharge or deformity Mouth/Throat: Oropharynx is without other ulcerations and moist  Neck: Normal range of motion. Neck supple. No JVD present. No tracheal deviation present or significant neck LA or mass Cardiovascular: Normal rate, regular rhythm, normal heart sounds and intact distal pulses.   Pulmonary/Chest: WOB normal and breath sounds without rales or wheezing  Abdominal: Soft. Bowel sounds are normal. NT. No HSM  Musculoskeletal: Normal range of motion. Exhibits no edema Lymphadenopathy: Has no other cervical adenopathy.  Neurological: Pt is alert and oriented to person, place, and time. Pt has normal reflexes. No cranial nerve deficit. Motor grossly intact, Gait intact Skin: Skin is warm and dry. _+ left dorsal foot 2 cm area eczema type rash noted or new ulcerations Psychiatric:  Has normal mood and affect. Behavior is normal without agitation No other exam findings  Lab Results  Component Value Date   WBC 8.0 08/05/2016   HGB 13.3 08/05/2016   HCT 39.5 08/05/2016   PLT 210.0 08/05/2016   GLUCOSE 101 (H) 08/05/2016   CHOL 219 (H) 08/05/2016   TRIG 218.0 (H) 08/05/2016   HDL 63.20  08/05/2016   LDLDIRECT 120.0 08/05/2016   LDLCALC 92 07/30/2015   ALT 11 08/05/2016   AST 17 08/05/2016   NA 140 08/05/2016   K 3.9 08/05/2016   CL 105 08/05/2016   CREATININE 0.70 08/05/2016   BUN 12 08/05/2016   CO2 29 08/05/2016   TSH 1.39 08/05/2016        Assessment & Plan:

## 2016-08-05 NOTE — Patient Instructions (Addendum)
We will refer you for the Cologuard.   Please continue all other medications as before, and refills have been done if requested.  Please have the pharmacy call with any other refills you may need.  Please continue your efforts at being more active, low cholesterol diet, and weight control.  You are otherwise up to date with prevention measures today.  Please keep your appointments with your specialists as you may have planned  Please go to the LAB in the Basement (turn left off the elevator) for the tests to be done today  You will be contacted by phone if any changes need to be made immediately.  Otherwise, you will receive a letter about your results with an explanation, but please check with MyChart first.  Please remember to sign up for MyChart if you have not done so, as this will be important to you in the future with finding out test results, communicating by private email, and scheduling acute appointments online when needed.  Please return in 1 year for your yearly visit, or sooner if needed, with Lab testing done 3-5 days before

## 2016-08-06 NOTE — Assessment & Plan Note (Signed)
Lab Results  Component Value Date   K 3.9 08/05/2016  improved,  to f/u any worsening symptoms or concerns

## 2016-08-06 NOTE — Assessment & Plan Note (Signed)

## 2016-08-06 NOTE — Assessment & Plan Note (Signed)
Mild elevated , pt declines change in tx, o/w stable overall by history and exam, recent data reviewed with pt, and pt to continue medical treatment as before,  to f/u any worsening symptoms or concerns BP Readings from Last 3 Encounters:  08/05/16 (!) 146/86  06/17/16 138/64  08/01/15 130/80

## 2016-08-08 ENCOUNTER — Other Ambulatory Visit: Payer: Self-pay | Admitting: Internal Medicine

## 2016-08-17 LAB — COLOGUARD: Cologuard: NEGATIVE

## 2016-08-28 ENCOUNTER — Encounter: Payer: Self-pay | Admitting: Internal Medicine

## 2016-08-28 NOTE — Progress Notes (Signed)
Abstracted and sent to scan  

## 2016-09-03 ENCOUNTER — Encounter: Payer: Self-pay | Admitting: Internal Medicine

## 2016-09-03 DIAGNOSIS — Z1159 Encounter for screening for other viral diseases: Secondary | ICD-10-CM

## 2016-09-29 ENCOUNTER — Ambulatory Visit (INDEPENDENT_AMBULATORY_CARE_PROVIDER_SITE_OTHER): Payer: BLUE CROSS/BLUE SHIELD

## 2016-09-29 DIAGNOSIS — Z23 Encounter for immunization: Secondary | ICD-10-CM | POA: Diagnosis not present

## 2017-02-04 ENCOUNTER — Ambulatory Visit: Payer: BLUE CROSS/BLUE SHIELD | Admitting: Internal Medicine

## 2017-02-04 ENCOUNTER — Encounter: Payer: Self-pay | Admitting: Internal Medicine

## 2017-02-04 VITALS — BP 136/86 | HR 69 | Temp 97.6°F | Ht 64.0 in | Wt 160.0 lb

## 2017-02-04 DIAGNOSIS — F411 Generalized anxiety disorder: Secondary | ICD-10-CM

## 2017-02-04 DIAGNOSIS — R197 Diarrhea, unspecified: Secondary | ICD-10-CM | POA: Diagnosis not present

## 2017-02-04 DIAGNOSIS — I10 Essential (primary) hypertension: Secondary | ICD-10-CM | POA: Diagnosis not present

## 2017-02-04 NOTE — Patient Instructions (Signed)
OK to try Imodium as needed for diarrhea  Please call if gets worse again, for stool tests to be done  Please continue all other medications as before, and refills have been done if requested.  Please have the pharmacy call with any other refills you may need.  Please keep your appointments with your specialists as you may have planned

## 2017-02-04 NOTE — Progress Notes (Signed)
Subjective:    Patient ID: Deanna Buck, female    DOB: 1945-08-17, 72 y.o.   MRN: [id number]  HPI  Here with somewhat difficult historian as she feels embarrased and uncomfortable to discuss recent onset 1 month diarrheal stools, watery non bloody up to 4 -5 times per day (but more recently down to twice per day, no longer watery but soft with color ok, No blood, no pain, no fever, no HA, itching, n/v or bloating, no excessive gas symptoms, no recent change in diet, no recent meds changed, has even tried gluten free diet,  Mentions no real change in diet or lack of excercise, denies worsening anxiety or depressive symptoms, suicidal ideation, or panic.  Denies worsening reflux, abd pain, dysphagia.   Past Medical History:  Diagnosis Date  . Abnormal mammogram 05/2013   L breast  . ANXIETY   . Breast cancer ([place])    left breast  . ECZEMA   . Full dentures   . GERD   . Hypertension    Pt states she takes Nadolol for Anxiety, not BP  . OA (osteoarthritis)   . Radiation 10/24/13-11/30/13   left chest wall and axilla  . Wears contact lenses    also wears glasses   Past Surgical History:  Procedure Laterality Date  . BREAST IMPLANT REMOVAL Left 10/24/2014   Procedure: REMOVAL LEFT BREAST IMPLANT, Excision of Nipple Areola Left Breast;  Surgeon: [name], MD;  Location: [facility];  Service: Plastics;  Laterality: Left;  . BREAST LUMPECTOMY  1982   rt and lt benign  . BREAST RECONSTRUCTION WITH PLACEMENT OF TISSUE EXPANDER AND FLEX HD (ACELLULAR HYDRATED DERMIS) Left 08/04/2013   Procedure: BREAST RECONSTRUCTION WITH PLACEMENT OF TISSUE EXPANDER AND  POSSIBLE FLEX HD (ACELLULAR HYDRATED DERMIS);  Surgeon: [name], MD;  Location: [place];  Service: Plastics;  Laterality: Left;  . INCISION AND DRAINAGE OF WOUND Left 08/30/2013   Procedure: DEBRIDEMENT OF LEFT BREAST;  Surgeon: [name], MD;  Location: [facility];  Service:  Plastics;  Laterality: Left;  . 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 LIPOSUCTION WITH LIPOFILLING Left 05/30/2014   Procedure: LIPOFILLING TO LEFT CHEST;  Surgeon: [name], MD;  Location: [place];  Service: Plastics;  Laterality: Left;  Marland Kitchen MASS EXCISION Right 05/30/2014   Procedure: EXCISION OF RIGHT AXILLARY LIPOMA;  Surgeon: [name], MD;  Location: [place];  Service: Plastics;  Laterality: Right;  . MASTECTOMY Left   . MASTOPEXY Right 05/30/2014   Procedure: RIGHT MASTOPEXY FOR SYMMETRY;  Surgeon: [name], MD;  Location: [place];  Service: Plastics;  Laterality: Right;  . REDUCTION MAMMAPLASTY Right 1980  . REMOVAL OF TISSUE EXPANDER AND PLACEMENT OF IMPLANT Left 05/30/2014   Procedure: REMOVAL OF LEFT TISSUE EXPANDER AND PLACEMENT OF IMPLANT;  Surgeon: [name], MD;  Location: [place];  Service: Plastics;  Laterality: Left;    reports that  has never smoked. she has never used smokeless tobacco. She reports that she drinks alcohol. She reports that she does not use drugs. family history includes Arthritis in her other; Breast cancer in her mother; Dementia in her mother; Hyperlipidemia in her other; Hypertension in her other; Kidney disease in her other. Allergies  Allergen Reactions  . Morphine And Related Shortness Of Breath  . Sulfonamide Derivatives Hives and Itching  .  Banana Other (See Comments)    Pain  . Penicillins Other (See Comments)    Dr. Sherolyn Buba told her she was allergic to PCN.   Current Outpatient Medications on File Prior to Visit  Medication Sig Dispense Refill  . Cholecalciferol (VITAMIN D) 2000 units tablet Take 2,000 Units by mouth daily.    . hydrochlorothiazide (HYDRODIURIL) 25 MG tablet Take 1 tablet (25 mg total) by mouth daily. 90 tablet 3  . nadolol (CORGARD) 40 MG tablet TAKE ONE-HALF (1/2)  TABLET DAILY 45 tablet 3  . non-metallic deodorant (ALRA) MISC Apply 1 application topically daily as needed.    . potassium chloride (K-DUR) 10 MEQ tablet Take 2 tablets (20 mEq total) by mouth daily. 180 tablet 3  . tamoxifen (NOLVADEX) 20 MG tablet Take 1 tablet (20 mg total) by mouth daily. 90 tablet 4  . triamcinolone cream (KENALOG) 0.1 % Apply 1 application topically 2 (two) times daily. 80 g 1  . valACYclovir (VALTREX) 1000 MG tablet Take 1 tablet (1,000 mg total) by mouth daily as needed. 30 tablet 0  . vitamin B-12 (CYANOCOBALAMIN) 100 MCG tablet Take 50 mcg by mouth as needed.      No current facility-administered medications on file prior to visit.    Review of Systems All otherwise neg per pt    Objective:   Physical Exam BP 136/86   Pulse 69   Temp 97.6 F (36.4 C) (Oral)   Ht 5\' 4"  (1.626 m)   Wt 160 lb (72.6 kg)   SpO2 98%   BMI 27.46 kg/m  VS noted,  Constitutional: Pt appears in NAD HENT: Head: NCAT.  Right Ear: External ear normal.  Left Ear: External ear normal.  Eyes: . Pupils are equal, round, and reactive to light. Conjunctivae and EOM are normal Nose: without d/c or deformity Neck: Neck supple. Gross normal ROM Cardiovascular: Normal rate and regular rhythm.   Pulmonary/Chest: Effort normal and breath sounds without rales or wheezing.  Abd:  Soft, NT, ND, + BS, no organomegaly Neurological: Pt is alert. At baseline orientation, motor grossly intact Skin: Skin is warm. No rashes, other new lesions, no LE edema Psychiatric: Pt behavior is normal without agitation  No other exam findings Lab Results  Component Value Date   WBC 8.0 08/05/2016   HGB 13.3 08/05/2016   HCT 39.5 08/05/2016   PLT 210.0 08/05/2016   GLUCOSE 101 (H) 08/05/2016   CHOL 219 (H) 08/05/2016   TRIG 218.0 (H) 08/05/2016   HDL 63.20 08/05/2016   LDLDIRECT 120.0 08/05/2016   LDLCALC 92 07/30/2015   ALT 11 08/05/2016   AST 17 08/05/2016   NA 140 08/05/2016   K 3.9 08/05/2016     CL 105 08/05/2016   CREATININE 0.70 08/05/2016   BUN 12 08/05/2016   CO2 29 08/05/2016   TSH 1.39 08/05/2016       Assessment & Plan:

## 2017-02-06 DIAGNOSIS — C50919 Malignant neoplasm of unspecified site of unspecified female breast: Secondary | ICD-10-CM | POA: Diagnosis not present

## 2017-02-07 NOTE — Assessment & Plan Note (Signed)
stable overall by history and exam, recent data reviewed with pt, and pt to continue medical treatment as before,  to f/u any worsening symptoms or concerns BP Readings from Last 3 Encounters:  02/04/17 136/86  08/05/16 (!) 146/86  06/17/16 138/64

## 2017-02-07 NOTE — Assessment & Plan Note (Signed)
Overall stable, doubt is significant element of recent symptoms, to cont same tx

## 2017-02-07 NOTE — Assessment & Plan Note (Signed)
Recent acute now improving symptoms, afeb, no pain or blood, exam benign, overall occurring less than 4 wks, likely infectious related it seems now improving; d/w pt, will hold on GI panel for now, for immodium prn but may not need at this point, and consider further studies or tx or GI referral if symptoms recur

## 2017-02-20 ENCOUNTER — Other Ambulatory Visit: Payer: Self-pay | Admitting: Internal Medicine

## 2017-05-18 ENCOUNTER — Other Ambulatory Visit: Payer: Self-pay | Admitting: Oncology

## 2017-05-18 DIAGNOSIS — Z1231 Encounter for screening mammogram for malignant neoplasm of breast: Secondary | ICD-10-CM

## 2017-06-10 ENCOUNTER — Ambulatory Visit
Admission: RE | Admit: 2017-06-10 | Discharge: 2017-06-10 | Disposition: A | Discharge status: Home / Self Care | Payer: BLUE CROSS/BLUE SHIELD | Source: Ambulatory Visit | Attending: Oncology | Admitting: Oncology

## 2017-06-10 DIAGNOSIS — Z1231 Encounter for screening mammogram for malignant neoplasm of breast: Secondary | ICD-10-CM

## 2017-06-17 ENCOUNTER — Inpatient Hospital Stay: Payer: BLUE CROSS/BLUE SHIELD | Attending: Oncology

## 2017-06-17 DIAGNOSIS — Z9012 Acquired absence of left breast and nipple: Secondary | ICD-10-CM | POA: Insufficient documentation

## 2017-06-17 DIAGNOSIS — Z79810 Long term (current) use of selective estrogen receptor modulators (SERMs): Secondary | ICD-10-CM | POA: Diagnosis not present

## 2017-06-17 DIAGNOSIS — K219 Gastro-esophageal reflux disease without esophagitis: Secondary | ICD-10-CM | POA: Insufficient documentation

## 2017-06-17 DIAGNOSIS — Z170 Estrogen receptor positive status [ER+]: Secondary | ICD-10-CM | POA: Diagnosis not present

## 2017-06-17 DIAGNOSIS — Z923 Personal history of irradiation: Secondary | ICD-10-CM | POA: Insufficient documentation

## 2017-06-17 DIAGNOSIS — Z803 Family history of malignant neoplasm of breast: Secondary | ICD-10-CM | POA: Insufficient documentation

## 2017-06-17 DIAGNOSIS — Z79899 Other long term (current) drug therapy: Secondary | ICD-10-CM | POA: Insufficient documentation

## 2017-06-17 DIAGNOSIS — C50412 Malignant neoplasm of upper-outer quadrant of left female breast: Secondary | ICD-10-CM | POA: Diagnosis not present

## 2017-06-17 DIAGNOSIS — I10 Essential (primary) hypertension: Secondary | ICD-10-CM | POA: Diagnosis not present

## 2017-06-17 DIAGNOSIS — M1990 Unspecified osteoarthritis, unspecified site: Secondary | ICD-10-CM | POA: Insufficient documentation

## 2017-06-17 LAB — CBC WITH DIFFERENTIAL/PLATELET
BASOS ABS: 0.1 10*3/uL (ref 0.0–0.1)
BASOS PCT: 1 %
EOS PCT: 3 %
Eosinophils Absolute: 0.2 10*3/uL (ref 0.0–0.5)
HCT: 38 % (ref 34.8–46.6)
Hemoglobin: 12.7 g/dL (ref 11.6–15.9)
Lymphocytes Relative: 24 %
Lymphs Abs: 1.4 10*3/uL (ref 0.9–3.3)
MCH: 29.6 pg (ref 25.1–34.0)
MCHC: 33.4 g/dL (ref 31.5–36.0)
MCV: 88.6 fL (ref 79.5–101.0)
MONO ABS: 0.6 10*3/uL (ref 0.1–0.9)
Monocytes Relative: 11 %
Neutro Abs: 3.6 10*3/uL (ref 1.5–6.5)
Neutrophils Relative %: 61 %
PLATELETS: 172 10*3/uL (ref 145–400)
RBC: 4.29 MIL/uL (ref 3.70–5.45)
RDW: 12.6 % (ref 11.2–14.5)
WBC: 5.8 10*3/uL (ref 3.9–10.3)

## 2017-06-17 LAB — COMPREHENSIVE METABOLIC PANEL
ALBUMIN: 3.7 g/dL (ref 3.5–5.0)
ALT: 9 U/L (ref 0–55)
AST: 18 U/L (ref 5–34)
Alkaline Phosphatase: 113 U/L (ref 40–150)
Anion gap: 7 (ref 3–11)
BUN: 13 mg/dL (ref 7–26)
CHLORIDE: 105 mmol/L (ref 98–109)
CO2: 28 mmol/L (ref 22–29)
Calcium: 9.8 mg/dL (ref 8.4–10.4)
Creatinine, Ser: 0.79 mg/dL (ref 0.60–1.10)
GFR calc Af Amer: >60 mL/min (ref >60)
GLUCOSE: 86 mg/dL (ref 70–140)
POTASSIUM: 3.7 mmol/L (ref 3.5–5.1)
Sodium: 140 mmol/L (ref 136–145)
Total Bilirubin: 0.8 mg/dL (ref 0.2–1.2)
Total Protein: 7 g/dL (ref 6.4–8.3)

## 2017-06-24 ENCOUNTER — Telehealth: Payer: Self-pay | Admitting: Adult Health

## 2017-06-24 ENCOUNTER — Encounter: Payer: Self-pay | Admitting: Adult Health

## 2017-06-24 ENCOUNTER — Inpatient Hospital Stay: Payer: BLUE CROSS/BLUE SHIELD | Admitting: Adult Health

## 2017-06-24 VITALS — BP 146/78 | HR 69 | Temp 98.6°F | Resp 18 | Ht 64.0 in | Wt 159.1 lb

## 2017-06-24 DIAGNOSIS — I10 Essential (primary) hypertension: Secondary | ICD-10-CM | POA: Diagnosis not present

## 2017-06-24 DIAGNOSIS — Z803 Family history of malignant neoplasm of breast: Secondary | ICD-10-CM | POA: Diagnosis not present

## 2017-06-24 DIAGNOSIS — C50412 Malignant neoplasm of upper-outer quadrant of left female breast: Secondary | ICD-10-CM | POA: Diagnosis not present

## 2017-06-24 DIAGNOSIS — Z923 Personal history of irradiation: Secondary | ICD-10-CM

## 2017-06-24 DIAGNOSIS — Z9012 Acquired absence of left breast and nipple: Secondary | ICD-10-CM

## 2017-06-24 DIAGNOSIS — Z79810 Long term (current) use of selective estrogen receptor modulators (SERMs): Secondary | ICD-10-CM

## 2017-06-24 DIAGNOSIS — Z170 Estrogen receptor positive status [ER+]: Secondary | ICD-10-CM

## 2017-06-24 DIAGNOSIS — M1990 Unspecified osteoarthritis, unspecified site: Secondary | ICD-10-CM | POA: Diagnosis not present

## 2017-06-24 DIAGNOSIS — K219 Gastro-esophageal reflux disease without esophagitis: Secondary | ICD-10-CM

## 2017-06-24 DIAGNOSIS — Z79899 Other long term (current) drug therapy: Secondary | ICD-10-CM

## 2017-06-24 NOTE — Telephone Encounter (Signed)
Gave patient avs and calendar of upcoming June 2020 appointments.  °

## 2017-06-24 NOTE — Progress Notes (Signed)
Deanna Buck  [phone] [phone]     ID: Deanna Buck DOB: 1945/11/26  MR#: [id number]  VHQ#:[id number]  Buck Care Team: Biagio Borg, MD as PCP - General (Internal Medicine) Bobbye Charleston, MD as Consulting Physician (Obstetrics and Gynecology) Jarome Matin, MD as Consulting Physician (Dermatology) Magrinat, Virgie Dad, MD as Consulting Physician (Oncology) Rolm Bookbinder M.D., Gery Pray, Arnoldo Hooker Th[place]mappa  CHIEF COMPLAINT: Early stage multifocal breast cancer  CURRENT TREATMENT:  tamoxifen    BREAST CANCER HISTORY: From Deanna original intake note:  Deanna Buck had routine yearly screening mammography with tomography [facility]27[facility]2013 [facility], showing a possible area of distortion in Deanna left breast. Left diagnostic mammography and ultrasonography 06/13/2013 showed Deanna breast density to be category C. In Deanna upper outer quadrant of Deanna left breast there was a mass measuring approximately 1.8 cm. It was not palpable on exam. Ultrasound showed a hypoechoic irregular mass measuring 1.3 cm correlating with Deanna mammographic finding. In addition, a smaller, 1.0 cm mass was detected approximately 3 cm from Deanna index lesion. Deanna left axilla was negative.  Biopsy of both masses in Deanna left breast 06/22/2013 showed ([id number]) morphologically similar invasive mammary carcinoma, grade 1 or 2, estrogen receptor positive at 93%, progesterone receptor positive at 91%, both with strong staining intensity, with an MIB-1 of 11%, and no HER-2 amplification, Deanna signals ratio being 1.03 and Deanna number per cell 1.55.  On 06/29/2013 Deanna Buck underwent bilateral breast MRI at [facility]. By MRI Deanna larger left breast mass measured 3.3 cm. Deanna second, smaller mass measured 1.1 cm. Combined Deanna masses measures 3.9 cm maximally. [By mammography, Deanna masses were 2.4 cm apart with a combined diameter of 4.9 cm.]  Deanna Buck's subsequent history is as  detailed below  INTERVAL HISTORY: Deanna Buck is here today for follow up of her h/o breast cancer.  She is taking Tamoxifen daily.  She does note hot flashes, mainly at night.  She is tolerating these without any interventions.  She is seeing GYN regularly.  She is not exercising.  She is seeing her PCP regularly.    REVIEW OF SYSTEMS: Deanna Buck is doing well.  She denies any breast issues.  She denies vision issues, headaches, dysphagia, indigestion, new lymphadenopathy, chest pain, palpitations, shortness of breath, cough, abdominal pain, constipation, diarrhea, nausea, vomiting.  A detailed ROS was non contributory.    PAST MEDICAL HISTORY: Past Medical History:  Diagnosis Date  . Abnormal mammogram 05/2013   L breast  . ANXIETY   . Breast cancer ([place])    left breast  . ECZEMA   . Full dentures   . GERD   . Hypertension    Pt states she takes Nadolol for Anxiety, not BP  . OA (osteoarthritis)   . Radiation 10/24/13-11/30/13   left chest wall and axilla  . Wears contact lenses    also wears glasses    PAST SURGICAL HISTORY: Past Surgical History:  Procedure Laterality Date  . BREAST IMPLANT REMOVAL Left 10/24/2014   Procedure: REMOVAL LEFT BREAST IMPLANT, Excision of Nipple Areola Left Breast;  Surgeon: [name], MD;  Location: [place];  Service: Plastics;  Laterality: Left;  . BREAST LUMPECTOMY  1982   rt and lt benign  . BREAST RECONSTRUCTION WITH PLACEMENT OF TISSUE EXPANDER AND FLEX HD (ACELLULAR HYDRATED DERMIS) Left 08/04/2013   Procedure: BREAST RECONSTRUCTION WITH PLACEMENT OF TISSUE EXPANDER AND  POSSIBLE FLEX HD (ACELLULAR HYDRATED DERMIS);  Surgeon: [name],  MD;  Location: [place];  Service: Plastics;  Laterality: Left;  . INCISION AND DRAINAGE OF WOUND Left 08/30/2013   Procedure: DEBRIDEMENT OF LEFT BREAST;  Surgeon: [name], MD;  Location: [place];  Service: Plastics;  Laterality: Left;  .  Kidney repair     [id number] for a congenital malformation, had a second breast biopsy (R) in 2007  . LIPOSUCTION WITH LIPOFILLING Left 05/30/2014   Procedure: LIPOFILLING TO LEFT CHEST;  Surgeon: [name], MD;  Location: [place];  Service: Plastics;  Laterality: Left;  Marland Kitchen MASS EXCISION Right 05/30/2014   Procedure: EXCISION OF RIGHT AXILLARY LIPOMA;  Surgeon: [name], MD;  Location: [place];  Service: Plastics;  Laterality: Right;  . MASTECTOMY Left   . MASTOPEXY Right 05/30/2014   Procedure: RIGHT MASTOPEXY FOR SYMMETRY;  Surgeon: [name], MD;  Location: [place];  Service: Plastics;  Laterality: Right;  . REDUCTION MAMMAPLASTY Right 1980  . REMOVAL OF TISSUE EXPANDER AND PLACEMENT OF IMPLANT Left 05/30/2014   Procedure: REMOVAL OF LEFT TISSUE EXPANDER AND PLACEMENT OF IMPLANT;  Surgeon: [name], MD;  Location: [place];  Service: Plastics;  Laterality: Left;    FAMILY HISTORY Family History  Problem Relation Age of Onset  . Breast cancer Mother   . Dementia Mother   . Arthritis Other   . Hypertension Other        parent  . Hyperlipidemia Other        parent  . Kidney disease Other        parent   Deanna Buck's father died at Deanna age of 67 with congestive heart failure. Deanna Buck's mother died at Deanna age of 17. She was diagnosed with breast cancer at Deanna age of 34. Deanna only other breast cancer in Deanna family was on Deanna opposite, father's side, a first cousin diagnosed in her 28s. Deanna Buck had one brother who died from a myocardial infarction. She had no sisters.  GYNECOLOGIC HISTORY:  No LMP recorded. Buck is postmenopausal. Menarche age 89, first live birth age 34, Deanna Buck is [place] P2. She underwent menopause approximately 2002. She did not take hormone replacement. She did take birth control pills remotely for approximately 13 years, with no complications.  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 Her children from a prior marriage are Michaela Corner, who is an Optometrist, and Lavon Paganini, who is also an Optometrist. Both live in [place]. Deanna Buck's husband Karishma Unrein work for [employer]. Deanna Buck has 2 grandchildren. She is not a Ambulance person.    ADVANCED DIRECTIVES: In place   HEALTH MAINTENANCE: Social History   Tobacco Use  . Smoking status: Never Smoker  . Smokeless tobacco: Never Used  . Tobacco comment: Married, but in stressful relationship with spouse  Substance Use Topics  . Alcohol use: Yes    Comment:  rare mixed drink  . Drug use: No     Colonoscopy: declines  PAP:  Bone density: osteopenia per Buck report  Lipid panel:  Allergies  Allergen Reactions  . Morphine And Related Shortness Of Breath  . Sulfonamide Derivatives Hives and Itching  . Banana Other (See Comments)    Pain  . Penicillins Other (See Comments)    Dr. Sherolyn Buba told her she was allergic to PCN.    Current Outpatient Medications  Medication Sig Dispense Refill  . Cholecalciferol (VITAMIN D) 2000 units  tablet Take 2,000 Units by mouth daily.    . hydrochlorothiazide (HYDRODIURIL) 25 MG tablet Take 1 tablet (25 mg total) by mouth daily. 90 tablet 3  . nadolol (CORGARD) 40 MG tablet TAKE ONE-HALF (1/2) TABLET DAILY 45 tablet 3  . non-metallic deodorant (ALRA) MISC Apply 1 application topically daily as needed.    . potassium chloride (K-DUR) 10 MEQ tablet Take 2 tablets (20 mEq total) by mouth daily. 180 tablet 3  . tamoxifen (NOLVADEX) 20 MG tablet Take 1 tablet (20 mg total) by mouth daily. 90 tablet 4  . triamcinolone cream (KENALOG) 0.1 % APPLY TOPICALLY TO Deanna AFFECTED AREA TWICE DAILY 80 g 0  . valACYclovir (VALTREX) 1000 MG tablet Take 1 tablet (1,000 mg total) by mouth daily as needed. 30 tablet 0  . vitamin B-12 (CYANOCOBALAMIN) 100 MCG tablet Take 50 mcg by mouth as needed.      No current facility-administered  medications for this visit.     OBJECTIVE:  Vitals:   06/24/17 1103 06/24/17 1115  BP: (!) 166/76 (!) 146/78  Pulse: 69   Resp: 18   Temp: 98.6 F (37 C)   SpO2: 99%      Body mass index is 27.31 kg/m.    ECOG FS:1 - Symptomatic but completely ambulatory GENERAL: Buck is a well appearing female in no acute distress HEENT:  Sclerae anicteric.  Oropharynx clear and moist. No ulcerations or evidence of oropharyngeal candidiasis. Neck is supple.  NODES:  No cervical, supraclavicular, or axillary lymphadenopathy palpated.  BREAST EXAM:  Left breast is surgically absent, no nodularities, skin changes or masses, right breast s/p mammoplasty, no nodules, masses, skin or nipple changes, benign bilateral breast exam LUNGS:  Clear to auscultation bilaterally.  No wheezes or rhonchi. HEART:  Regular rate and rhythm. No murmur appreciated. ABDOMEN:  Soft, nontender.  Positive, normoactive bowel sounds. No organomegaly palpated. MSK:  No focal spinal tenderness to palpation. Full range of motion bilaterally in Deanna upper extremities. EXTREMITIES:  No peripheral edema.   SKIN:  Clear with no obvious rashes or skin changes. No nail dyscrasia. NEURO:  Nonfocal. Well oriented.  Appropriate affect.       LAB RESULTS:  CMP     Component Value Date/Time   NA 140 06/17/2017 1131   NA 143 06/02/2016 1104   K 3.7 06/17/2017 1131   K 4.1 06/02/2016 1104   CL 105 06/17/2017 1131   CO2 28 06/17/2017 1131   CO2 27 06/02/2016 1104   GLUCOSE 86 06/17/2017 1131   GLUCOSE 98 06/02/2016 1104   BUN 13 06/17/2017 1131   BUN 12.7 06/02/2016 1104   CREATININE 0.79 06/17/2017 1131   CREATININE 0.8 06/02/2016 1104   CALCIUM 9.8 06/17/2017 1131   CALCIUM 9.6 06/02/2016 1104   PROT 7.0 06/17/2017 1131   PROT 6.6 06/02/2016 1104   ALBUMIN 3.7 06/17/2017 1131   ALBUMIN 3.5 06/02/2016 1104   AST 18 06/17/2017 1131   AST 20 06/02/2016 1104   ALT 9 06/17/2017 1131   ALT 14 06/02/2016 1104   ALKPHOS  113 06/17/2017 1131   ALKPHOS 96 06/02/2016 1104   BILITOT 0.8 06/17/2017 1131   BILITOT 1.07 06/02/2016 1104   GFRNONAA >60 06/17/2017 1131   GFRAA >60 06/17/2017 1131    I No results found for: SPEP  Lab Results  Component Value Date   WBC 5.8 06/17/2017   NEUTROABS 3.6 06/17/2017   HGB 12.7 06/17/2017   HCT 38.0 06/17/2017   MCV  88.6 06/17/2017   PLT 172 06/17/2017      Chemistry      Component Value Date/Time   NA 140 06/17/2017 1131   NA 143 06/02/2016 1104   K 3.7 06/17/2017 1131   K 4.1 06/02/2016 1104   CL 105 06/17/2017 1131   CO2 28 06/17/2017 1131   CO2 27 06/02/2016 1104   BUN 13 06/17/2017 1131   BUN 12.7 06/02/2016 1104   CREATININE 0.79 06/17/2017 1131   CREATININE 0.8 06/02/2016 1104      Component Value Date/Time   CALCIUM 9.8 06/17/2017 1131   CALCIUM 9.6 06/02/2016 1104   ALKPHOS 113 06/17/2017 1131   ALKPHOS 96 06/02/2016 1104   AST 18 06/17/2017 1131   AST 20 06/02/2016 1104   ALT 9 06/17/2017 1131   ALT 14 06/02/2016 1104   BILITOT 0.8 06/17/2017 1131   BILITOT 1.07 06/02/2016 1104       No results found for: LABCA2  No components found for: LABCA125  No results for input(s): INR in Deanna last 168 hours.  Urinalysis    Component Value Date/Time   COLORURINE YELLOW 08/05/2016 1042   APPEARANCEUR CLEAR 08/05/2016 1042   LABSPEC 1.010 08/05/2016 1042   PHURINE 7.0 08/05/2016 1042   GLUCOSEU NEGATIVE 08/05/2016 1042   HGBUR NEGATIVE 08/05/2016 1042   HGBUR negative 09/11/2009 1413   BILIRUBINUR NEGATIVE 08/05/2016 1042   BILIRUBINUR neg 02/17/2011 1625   KETONESUR NEGATIVE 08/05/2016 1042   PROTEINUR neg 02/17/2011 1625   PROTEINUR NEGATIVE 12/26/2008 1615   UROBILINOGEN 0.2 08/05/2016 1042   NITRITE NEGATIVE 08/05/2016 1042   LEUKOCYTESUR TRACE (A) 08/05/2016 1042    STUDIES:     ASSESSMENT: 72 y.o. [place] woman status post biopsy of 2 separate left breast masses 06/22/2013 for a clinical mT1c N0, stage IA  carcinoma, grade 1 or 2, estrogen receptor 93% positive, progesterone receptor 91% positive, with an MIB-1 of 11%, and no HER-2 amplification  (1) status post left mastectomy and sentinel lymph node sampling 08/04/2013 for a pT2 pN1, stage IIB invasive ductal carcinoma, grade 2, with negative margins, and repeat HER-2 again negative  (2) Oncotype score of 13 predicts a risk of recurrence or mortality within 5 years of 10% with tamoxifen alone, and 11% with tamoxifen plus chemotherapy; Deanna Buck was offered chemotherapy vs. participation in S007, but decided against both  (3) tamoxifen started 09/22/2013  (4) radiation  completed 11/30/2013  Left chest wall/reconstructed area/axilla -45 gray in 25 fraction  Additional boost to Deanna left chest wall/reconstructed area 5.4 gray for chemotherapy dose of 50.4 gray  (5)  Completed reconstruction 05/30/2014, namely: 1. Removal left tissue expander and placement silicone implant 2. Lipofilling to left chest 3. Acellular dermis for breast reconstruction left, total 50 cm2 4. Right mastopexy for symmetry 5. Excision right axillary lipoma 6 cm  PLAN:  Dareen is doing well today.  She continues on Tamoxifen with good tolerance.  She has no clinical or radiographic sign of breast cancer recurrence.  She will continue annual mammograms.  I continued to recommend healthy lifestyle including: healthy diet and exercise, skin cancer screening, colon cancer screening, GYN eval annually, continued mammograms.  Deanna Buck is in agreement.  Deanna Buck will return in 1 year for f/u with Dr. Jana Hakim.  She knows to call for any questions that may arise between now and her next appointment.     Scot Dock, NP   06/24/2017 1:29 PM

## 2017-07-23 ENCOUNTER — Other Ambulatory Visit: Payer: Self-pay | Admitting: Adult Health

## 2017-07-23 DIAGNOSIS — C50412 Malignant neoplasm of upper-outer quadrant of left female breast: Secondary | ICD-10-CM

## 2017-07-23 DIAGNOSIS — Z170 Estrogen receptor positive status [ER+]: Principal | ICD-10-CM

## 2017-07-30 ENCOUNTER — Other Ambulatory Visit: Payer: Self-pay | Admitting: Internal Medicine

## 2017-07-30 DIAGNOSIS — E876 Hypokalemia: Secondary | ICD-10-CM

## 2017-08-03 ENCOUNTER — Other Ambulatory Visit: Payer: Self-pay | Admitting: Internal Medicine

## 2017-08-03 ENCOUNTER — Encounter: Payer: Self-pay | Admitting: Adult Health

## 2017-08-04 ENCOUNTER — Other Ambulatory Visit: Payer: Self-pay

## 2017-08-04 DIAGNOSIS — C50412 Malignant neoplasm of upper-outer quadrant of left female breast: Secondary | ICD-10-CM

## 2017-08-04 DIAGNOSIS — Z170 Estrogen receptor positive status [ER+]: Principal | ICD-10-CM

## 2017-08-04 MED ORDER — TAMOXIFEN CITRATE 20 MG PO TABS: 20.0000 mg | ORAL_TABLET | Freq: Every day | ORAL | 4 refills | Status: DC

## 2017-08-11 ENCOUNTER — Encounter: Payer: BLUE CROSS/BLUE SHIELD | Admitting: Internal Medicine

## 2017-08-11 ENCOUNTER — Encounter: Payer: Self-pay | Admitting: Internal Medicine

## 2017-08-11 ENCOUNTER — Ambulatory Visit (INDEPENDENT_AMBULATORY_CARE_PROVIDER_SITE_OTHER): Payer: BLUE CROSS/BLUE SHIELD | Admitting: Internal Medicine

## 2017-08-11 ENCOUNTER — Other Ambulatory Visit (INDEPENDENT_AMBULATORY_CARE_PROVIDER_SITE_OTHER): Payer: BLUE CROSS/BLUE SHIELD

## 2017-08-11 VITALS — BP 140/80 | HR 64 | Temp 98.9°F | Ht 64.0 in | Wt 160.0 lb

## 2017-08-11 DIAGNOSIS — Z1159 Encounter for screening for other viral diseases: Secondary | ICD-10-CM

## 2017-08-11 DIAGNOSIS — Z0000 Encounter for general adult medical examination without abnormal findings: Secondary | ICD-10-CM

## 2017-08-11 LAB — LIPID PANEL
Cholesterol: 210 mg/dL — ABNORMAL HIGH (ref 0–200)
HDL: 58.9 mg/dL (ref >39.00)
NONHDL: 150.73
TRIGLYCERIDES: 230 mg/dL — AB (ref 0.0–149.0)
Total CHOL/HDL Ratio: 4
VLDL: 46 mg/dL — ABNORMAL HIGH (ref 0.0–40.0)

## 2017-08-11 LAB — URINALYSIS, ROUTINE W REFLEX MICROSCOPIC
BILIRUBIN URINE: NEGATIVE
HGB URINE DIPSTICK: NEGATIVE
Ketones, ur: NEGATIVE
NITRITE: NEGATIVE
RBC / HPF: NONE SEEN (ref 0–?)
Specific Gravity, Urine: 1.015 (ref 1.000–1.030)
TOTAL PROTEIN, URINE-UPE24: NEGATIVE
Urine Glucose: NEGATIVE
Urobilinogen, UA: 0.2 (ref 0.0–1.0)
pH: 6.5 (ref 5.0–8.0)

## 2017-08-11 LAB — CBC WITH DIFFERENTIAL/PLATELET
BASOS PCT: 1.3 % (ref 0.0–3.0)
Basophils Absolute: 0.1 10*3/uL (ref 0.0–0.1)
EOS PCT: 2.3 % (ref 0.0–5.0)
Eosinophils Absolute: 0.2 10*3/uL (ref 0.0–0.7)
HCT: 38.4 % (ref 36.0–46.0)
HEMOGLOBIN: 13 g/dL (ref 12.0–15.0)
LYMPHS ABS: 1.5 10*3/uL (ref 0.7–4.0)
Lymphocytes Relative: 21.9 % (ref 12.0–46.0)
MCHC: 33.7 g/dL (ref 30.0–36.0)
MCV: 88 fl (ref 78.0–100.0)
MONOS PCT: 13.6 % — AB (ref 3.0–12.0)
Monocytes Absolute: 0.9 10*3/uL (ref 0.1–1.0)
Neutro Abs: 4.2 10*3/uL (ref 1.4–7.7)
Neutrophils Relative %: 60.9 % (ref 43.0–77.0)
Platelets: 194 10*3/uL (ref 150.0–400.0)
RBC: 4.37 Mil/uL (ref 3.87–5.11)
RDW: 13.2 % (ref 11.5–15.5)
WBC: 6.9 10*3/uL (ref 4.0–10.5)

## 2017-08-11 LAB — TSH: TSH: 1.88 u[IU]/mL (ref 0.35–4.50)

## 2017-08-11 LAB — LDL CHOLESTEROL, DIRECT: Direct LDL: 116 mg/dL

## 2017-08-11 MED ORDER — HYDROCHLOROTHIAZIDE 25 MG PO TABS: 25.0000 mg | ORAL_TABLET | Freq: Every day | ORAL | 3 refills | Status: DC

## 2017-08-11 NOTE — Assessment & Plan Note (Signed)

## 2017-08-11 NOTE — Patient Instructions (Signed)

## 2017-08-11 NOTE — Progress Notes (Signed)
Subjective:    Patient ID: Deanna Buck, female    DOB: December 01, 1945, 72 y.o.   MRN: [id number]  HPI    Here for wellness and f/u;  Overall doing ok;  Pt denies Chest pain, worsening SOB, DOE, wheezing, orthopnea, PND, worsening LE edema, palpitations, dizziness or syncope.  Pt denies neurological change such as new headache, facial or extremity weakness.  Pt denies polydipsia, polyuria, or low sugar symptoms. Pt states overall good compliance with treatment and medications, good tolerability, and has been trying to follow appropriate diet.  Pt denies worsening depressive symptoms, suicidal ideation or panic. No fever, night sweats, wt loss, loss of appetite, or other constitutional symptoms.  Pt states good ability with ADL's, has low fall risk, home safety reviewed and adequate, no other significant changes in hearing or vision, and only occasionally active with exercise. Never took the fosamax for osetopenia due to wariness of side effects, especialy since her mother took it.  No other new complaints  Past Medical History:  Diagnosis Date  . Abnormal mammogram 05/2013   L breast  . ANXIETY   . Breast cancer ([place])    left breast  . ECZEMA   . Full dentures   . GERD   . Hypertension    Pt states she takes Nadolol for Anxiety, not BP  . OA (osteoarthritis)   . Radiation 10/24/13-11/30/13   left chest wall and axilla  . Wears contact lenses    also wears glasses   Past Surgical History:  Procedure Laterality Date  . BREAST IMPLANT REMOVAL Left 10/24/2014   Procedure: REMOVAL LEFT BREAST IMPLANT, Excision of Nipple Areola Left Breast;  Surgeon: [name], MD;  Location: [place];  Service: Plastics;  Laterality: Left;  . BREAST LUMPECTOMY  1982   rt and lt benign  . BREAST RECONSTRUCTION WITH PLACEMENT OF TISSUE EXPANDER AND FLEX HD (ACELLULAR HYDRATED DERMIS) Left 08/04/2013   Procedure: BREAST RECONSTRUCTION WITH PLACEMENT OF TISSUE EXPANDER AND  POSSIBLE FLEX HD  (ACELLULAR HYDRATED DERMIS);  Surgeon: [name], MD;  Location: [place];  Service: Plastics;  Laterality: Left;  . INCISION AND DRAINAGE OF WOUND Left 08/30/2013   Procedure: DEBRIDEMENT OF LEFT BREAST;  Surgeon: [name], MD;  Location: [facility];  Service: Plastics;  Laterality: Left;  . 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 LIPOSUCTION WITH LIPOFILLING Left 05/30/2014   Procedure: LIPOFILLING TO LEFT CHEST;  Surgeon: [name], MD;  Location: [facility];  Service: Plastics;  Laterality: Left;  Marland Kitchen MASS EXCISION Right 05/30/2014   Procedure: EXCISION OF RIGHT AXILLARY LIPOMA;  Surgeon: [name], MD;  Location: [facility] [place];  Service: Plastics;  Laterality: Right;  . MASTECTOMY Left   . MASTOPEXY Right 05/30/2014   Procedure: RIGHT MASTOPEXY FOR SYMMETRY;  Surgeon: [name], MD;  Location: [place];  Service: Plastics;  Laterality: Right;  . REDUCTION MAMMAPLASTY Right 1980  . REMOVAL OF TISSUE EXPANDER AND PLACEMENT OF IMPLANT Left 05/30/2014   Procedure: REMOVAL OF LEFT TISSUE EXPANDER AND PLACEMENT OF IMPLANT;  Surgeon: [name], MD;  Location: [place];  Service: Plastics;  Laterality: Left;    reports that she has never smoked. She has never used smokeless tobacco. She reports that she drinks alcohol. She reports that she does not use drugs. family history includes Arthritis in her other;  Breast cancer in her mother; Dementia in her mother; Hyperlipidemia in her other; Hypertension in her other; Kidney disease in her other. Allergies  Allergen Reactions  . Morphine And Related Shortness Of Breath  . Sulfonamide Derivatives Hives and Itching  . Banana Other (See Comments)    Pain  . Penicillins Other (See Comments)    Dr. Sherolyn Buba told her she was allergic to PCN.   Current Outpatient  Medications on File Prior to Visit  Medication Sig Dispense Refill  . Cholecalciferol (VITAMIN D) 2000 units tablet Take 2,000 Units by mouth daily.    . hydrochlorothiazide (HYDRODIURIL) 25 MG tablet Take 1 tablet (25 mg total) by mouth daily. 90 tablet 3  . KLOR-CON M10 10 MEQ tablet TAKE 2 TABLETS (=20MEQ)    DAILY 180 tablet 0  . nadolol (CORGARD) 40 MG tablet TAKE ONE-HALF (1/2) TABLET DAILY 45 tablet 0  . non-metallic deodorant (ALRA) MISC Apply 1 application topically daily as needed.    . tamoxifen (NOLVADEX) 20 MG tablet Take 1 tablet (20 mg total) by mouth daily. 90 tablet 4  . triamcinolone cream (KENALOG) 0.1 % APPLY TOPICALLY TO THE AFFECTED AREA TWICE DAILY 80 g 0  . valACYclovir (VALTREX) 1000 MG tablet Take 1 tablet (1,000 mg total) by mouth daily as needed. 30 tablet 0  . vitamin B-12 (CYANOCOBALAMIN) 100 MCG tablet Take 50 mcg by mouth as needed.      No current facility-administered medications on file prior to visit.    Review of Systems Constitutional: Negative for other unusual diaphoresis, sweats, appetite or weight changes HENT: Negative for other worsening hearing loss, ear pain, facial swelling, mouth sores or neck stiffness.   Eyes: Negative for other worsening pain, redness or other visual disturbance.  Respiratory: Negative for other stridor or swelling Cardiovascular: Negative for other palpitations or other chest pain  Gastrointestinal: Negative for worsening diarrhea or loose stools, blood in stool, distention or other pain Genitourinary: Negative for hematuria, flank pain or other change in urine volume.  Musculoskeletal: Negative for myalgias or other joint swelling.  Skin: Negative for other color change, or other wound or worsening drainage.  Neurological: Negative for other syncope or numbness. Hematological: Negative for other adenopathy or swelling Psychiatric/Behavioral: Negative for hallucinations, other worsening agitation, SI, self-injury, or new  decreased concentration All other system neg per pt    Objective:   Physical Exam BP 140/80   Pulse 64   Temp 98.9 F (37.2 C) (Oral)   Ht 5\' 4"  (1.626 m)   Wt 160 lb (72.6 kg)   SpO2 96%   BMI 27.46 kg/m  VS noted,  Constitutional: Pt is oriented to person, place, and time. Appears well-developed and well-nourished, in no significant distress and comfortable Head: Normocephalic and atraumatic  Eyes: Conjunctivae and EOM are normal. Pupils are equal, round, and reactive to light Right Ear: External ear normal without discharge Left Ear: External ear normal without discharge Nose: Nose without discharge or deformity Mouth/Throat: Oropharynx is without other ulcerations and moist  Neck: Normal range of motion. Neck supple. No JVD present. No tracheal deviation present or significant neck LA or mass Cardiovascular: Normal rate, regular rhythm, normal heart sounds and intact distal pulses.   Pulmonary/Chest: WOB normal and breath sounds without rales or wheezing  Abdominal: Soft. Bowel sounds are normal. NT. No HSM  Musculoskeletal: Normal range of motion. Exhibits no edema Lymphadenopathy: Has no other cervical adenopathy.  Neurological: Pt is alert and oriented to person, place, and  time. Pt has normal reflexes. No cranial nerve deficit. Motor grossly intact, Gait intact Skin: Skin is warm and dry. No rash noted or new ulcerations Psychiatric:  Has normal mood and affect. Behavior is normal without agitation No other exam findings Lab Results  Component Value Date   WBC 6.9 08/11/2017   HGB 13.0 08/11/2017   HCT 38.4 08/11/2017   PLT 194.0 08/11/2017   GLUCOSE 86 06/17/2017   CHOL 210 (H) 08/11/2017   TRIG 230.0 (H) 08/11/2017   HDL 58.90 08/11/2017   LDLDIRECT 116.0 08/11/2017   LDLCALC 92 07/30/2015   ALT 9 06/17/2017   AST 18 06/17/2017   NA 140 06/17/2017   K 3.7 06/17/2017   CL 105 06/17/2017   CREATININE 0.79 06/17/2017   BUN 13 06/17/2017   CO2 28 06/17/2017    TSH 1.88 08/11/2017         Assessment & Plan:

## 2017-08-12 LAB — HEPATITIS C ANTIBODY
HEP C AB: NONREACTIVE
SIGNAL TO CUT-OFF: 0.01 (ref <1.00)

## 2017-09-09 ENCOUNTER — Other Ambulatory Visit: Payer: Self-pay | Admitting: Internal Medicine

## 2017-10-05 ENCOUNTER — Ambulatory Visit (INDEPENDENT_AMBULATORY_CARE_PROVIDER_SITE_OTHER): Payer: BLUE CROSS/BLUE SHIELD

## 2017-10-05 ENCOUNTER — Other Ambulatory Visit: Payer: Self-pay | Admitting: Internal Medicine

## 2017-10-05 DIAGNOSIS — E876 Hypokalemia: Secondary | ICD-10-CM

## 2017-10-05 DIAGNOSIS — Z23 Encounter for immunization: Secondary | ICD-10-CM | POA: Diagnosis not present

## 2017-10-24 ENCOUNTER — Other Ambulatory Visit: Payer: Self-pay | Admitting: Internal Medicine

## 2017-11-05 ENCOUNTER — Other Ambulatory Visit: Payer: Self-pay | Admitting: Internal Medicine

## 2017-11-05 MED ORDER — NADOLOL 40 MG PO TABS: 20.0000 mg | ORAL_TABLET | Freq: Every day | ORAL | 2 refills | Status: DC

## 2017-11-06 ENCOUNTER — Telehealth: Payer: Self-pay | Admitting: Internal Medicine

## 2017-11-06 NOTE — Telephone Encounter (Signed)
Copied from [place] [id number]. Topic: General - Other >> Nov 06, 2017  9:24 AM Deanna Buck, NT wrote: Reason for CRM: Pt calling to see if Dr. Jenny Buck will be willing to write for her to be excused from jury duty. She states she is on medication for anxiety and is unable to sit through jury duty.  LOV: 08/11/17  Called and informed patient Dr.John is out of office until 11/4, & That is this is not normally a reason we would right someone out of jury duty. She states she can not handle going, & that she will be 73 in February (you can be excised if 73). I informed her I will send the information to Dr.John to advise on.

## 2017-11-07 NOTE — Telephone Encounter (Signed)
I think this would be ok and Shirron to please write the excuse note, unless the pt can wait until I return Nov 4,  thanks

## 2017-11-09 NOTE — Telephone Encounter (Signed)
Letter has been done.  Pt has been informed. Letter is at front desk ready for pick up.

## 2017-11-17 DIAGNOSIS — Z124 Encounter for screening for malignant neoplasm of cervix: Secondary | ICD-10-CM | POA: Diagnosis not present

## 2017-11-17 DIAGNOSIS — Z01419 Encounter for gynecological examination (general) (routine) without abnormal findings: Secondary | ICD-10-CM | POA: Diagnosis not present

## 2017-11-17 DIAGNOSIS — Z6827 Body mass index (BMI) 27.0-27.9, adult: Secondary | ICD-10-CM | POA: Diagnosis not present

## 2018-04-10 ENCOUNTER — Other Ambulatory Visit: Payer: Self-pay | Admitting: Oncology

## 2018-04-10 DIAGNOSIS — Z170 Estrogen receptor positive status [ER+]: Principal | ICD-10-CM

## 2018-04-10 DIAGNOSIS — C50412 Malignant neoplasm of upper-outer quadrant of left female breast: Secondary | ICD-10-CM

## 2018-05-01 ENCOUNTER — Other Ambulatory Visit: Payer: Self-pay | Admitting: Internal Medicine

## 2018-05-01 DIAGNOSIS — E876 Hypokalemia: Secondary | ICD-10-CM

## 2018-06-09 ENCOUNTER — Telehealth: Payer: Self-pay | Admitting: Oncology

## 2018-06-09 NOTE — Telephone Encounter (Signed)
[facility] 6/10 moved f/u to 6/11. Left message for patient.  Schedule mailed.

## 2018-06-16 ENCOUNTER — Telehealth: Payer: Self-pay | Admitting: Oncology

## 2018-06-16 NOTE — Telephone Encounter (Signed)
Rescheduled 6/11 appt per scch msg. Called and left msg for patient

## 2018-06-23 ENCOUNTER — Ambulatory Visit: Payer: BLUE CROSS/BLUE SHIELD | Admitting: Oncology

## 2018-06-24 ENCOUNTER — Ambulatory Visit: Payer: BLUE CROSS/BLUE SHIELD | Admitting: Oncology

## 2018-07-21 NOTE — Progress Notes (Signed)
[place]  [phone] [phone]     ID: JLYNN LY DOB: 01/09/1946  MR#: [id number]  JHE#:[id number]  Patient Care Team: Biagio Borg, MD as PCP - General (Internal Medicine) Bobbye Charleston, MD as Consulting Physician (Obstetrics and Gynecology) Jarome Matin, MD as Consulting Physician (Dermatology) Shyam Dawson, Virgie Dad, MD as Consulting Physician (Oncology) OTHER MD:   CHIEF COMPLAINT: Early stage multifocal breast cancer (s/p left mastectomy)  CURRENT TREATMENT: Completing 5 years of tamoxifen    INTERVAL HISTORY: Deanna Buck is here today for follow up of her h/o breast cancer.    She continues on tamoxifen with good tolerance. She reports some hot flashes and denies any vaginal wetness or dryness.  Since her last visit, she has not undergone any additional studies. Her last right screening mammogram was on 06/10/2017. She states she recently received her annual reminder letter and will be calling them to schedule this in the near future.  She last saw her OB/GYN on 11/17/2017.   REVIEW OF SYSTEMS: Liann reports she gets tired in the afternoons, around 2-3 pm. She enjoys working in her garden. She notes occasional pains, which she attributes to aging. Her husband works from home. She notes she goes out once a week to get groceries, ensuring to wear her mask and sanitize her hands. She states she is keeping her distance from her friends. She also notes she recently ate a banana without issues. This has been noted as an allergy in her chart. A detailed review of systems was otherwise entirely negative.   BREAST CANCER HISTORY: From the original intake note:  Deanna Buck had routine yearly screening mammography with tomography 05/27/2013 at the breast Center, showing a possible area of distortion in the left breast. Left diagnostic mammography and ultrasonography 06/13/2013 showed the breast density to be category C. In the upper outer quadrant of  the left breast there was a mass measuring approximately 1.8 cm. It was not palpable on exam. Ultrasound showed a hypoechoic irregular mass measuring 1.3 cm correlating with the mammographic finding. In addition, a smaller, 1.0 cm mass was detected approximately 3 cm from the index lesion. The left axilla was negative.  Biopsy of both masses in the left breast 06/22/2013 showed ([id number]) morphologically similar invasive mammary carcinoma, grade 1 or 2, estrogen receptor positive at 93%, progesterone receptor positive at 91%, both with strong staining intensity, with an MIB-1 of 11%, and no HER-2 amplification, the signals ratio being 1.03 and the number per cell 1.55.  On 06/29/2013 the patient underwent bilateral breast MRI at [facility]. By MRI the larger left breast mass measured 3.3 cm. The second, smaller mass measured 1.1 cm. Combined the masses measures 3.9 cm maximally. [By mammography, the masses were 2.4 cm apart with a combined diameter of 4.9 cm.]  The patient's subsequent history is as detailed below   PAST MEDICAL HISTORY: Past Medical History:  Diagnosis Date  . Abnormal mammogram 05/2013   L breast  . ANXIETY   . Breast cancer ([place])    left breast  . ECZEMA   . Full dentures   . GERD   . Hypertension    Pt states she takes Nadolol for Anxiety, not BP  . OA (osteoarthritis)   . Radiation 10/24/13-11/30/13   left chest wall and axilla  . Wears contact lenses    also wears glasses    PAST SURGICAL HISTORY: Past Surgical History:  Procedure Laterality Date  . BREAST IMPLANT REMOVAL Left 10/24/2014  Procedure: REMOVAL LEFT BREAST IMPLANT, Excision of Nipple Areola Left Breast;  Surgeon: [name], MD;  Location: [facility];  Service: Plastics;  Laterality: Left;  . BREAST LUMPECTOMY  1982   rt and lt benign  . BREAST RECONSTRUCTION WITH PLACEMENT OF TISSUE EXPANDER AND FLEX HD (ACELLULAR HYDRATED DERMIS) Left 08/04/2013   Procedure:  BREAST RECONSTRUCTION WITH PLACEMENT OF TISSUE EXPANDER AND  POSSIBLE FLEX HD (ACELLULAR HYDRATED DERMIS);  Surgeon: [name], MD;  Location: [facility];  Service: Plastics;  Laterality: Left;  . INCISION AND DRAINAGE OF WOUND Left 08/30/2013   Procedure: DEBRIDEMENT OF LEFT BREAST;  Surgeon: [name], MD;  Location: [facility];  Service: Plastics;  Laterality: Left;  . Kidney repair     [phone] for a congenital malformation, had a second breast biopsy (R) in 2007  . LIPOSUCTION WITH LIPOFILLING Left 05/30/2014   Procedure: LIPOFILLING TO LEFT CHEST;  Surgeon: [name], MD;  Location: [facility];  Service: Plastics;  Laterality: Left;  Marland Kitchen MASS EXCISION Right 05/30/2014   Procedure: EXCISION OF RIGHT AXILLARY LIPOMA;  Surgeon: [name], MD;  Location: [facility];  Service: Plastics;  Laterality: Right;  . MASTECTOMY Left   . MASTOPEXY Right 05/30/2014   Procedure: RIGHT MASTOPEXY FOR SYMMETRY;  Surgeon: [name], MD;  Location: [facility];  Service: Plastics;  Laterality: Right;  . REDUCTION MAMMAPLASTY Right 1980  . REMOVAL OF TISSUE EXPANDER AND PLACEMENT OF IMPLANT Left 05/30/2014   Procedure: REMOVAL OF LEFT TISSUE EXPANDER AND PLACEMENT OF IMPLANT;  Surgeon: [name], MD;  Location: [facility];  Service: Plastics;  Laterality: Left;    FAMILY HISTORY Family History  Problem Relation Age of Onset  . Breast cancer Mother   . Dementia Mother   . Arthritis Other   . Hypertension Other        parent  . Hyperlipidemia Other        parent  . Kidney disease Other        parent   the patient's father died at the age of 2 with congestive heart failure. The patient's mother died at the age of 66. She was diagnosed with breast cancer at the age of 56. The only other breast cancer in the family was on the opposite, father's side, a first cousin diagnosed in her 24s.  The patient had one brother who died from a myocardial infarction. She had no sisters.   GYNECOLOGIC HISTORY:  No LMP recorded. Patient is postmenopausal. Menarche age 17, first live birth age 23, the patient is [place] P2. She underwent menopause approximately 2002. She did not take hormone replacement. She did take birth control pills remotely for approximately 13 years, with no complications.   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 Her children from a prior marriage are Michaela Corner, who is an Optometrist, and Lavon Paganini, who is also an Optometrist. Both live in [place]. The patient's husband Toni Demo work for [employer]. The patient has 2 grandchildren. She is not a Ambulance person.    ADVANCED DIRECTIVES: In place   HEALTH MAINTENANCE: Social History   Tobacco Use  . Smoking status: Never Smoker  . Smokeless tobacco: Never Used  . Tobacco comment: Married, but in stressful relationship with spouse  Substance Use Topics  . Alcohol use: Yes    Comment:  rare mixed drink  . Drug use: No     Colonoscopy:  declines  PAP:  Bone density: osteopenia per patient report  Lipid panel:  Allergies  Allergen Reactions  . Morphine And Related Shortness Of Breath  . Sulfonamide Derivatives Hives and Itching  . Banana Other (See Comments)    Pain  . Penicillins Other (See Comments)    Dr. Sherolyn Buba told her she was allergic to PCN.    Current Outpatient Medications  Medication Sig Dispense Refill  . Cholecalciferol (VITAMIN D) 2000 units tablet Take 2,000 Units by mouth daily.    . hydrochlorothiazide (HYDRODIURIL) 25 MG tablet Take 1 tablet (25 mg total) by mouth daily. 90 tablet 3  . KLOR-CON M10 10 MEQ tablet TAKE 2 TABLETS (=20MEQ)    DAILY 180 tablet 1  . nadolol (CORGARD) 40 MG tablet Take 0.5 tablets (20 mg total) by mouth daily. 45 tablet 2  . non-metallic deodorant (ALRA) MISC Apply 1 application topically daily as needed.    . triamcinolone  cream (KENALOG) 0.1 % APPLY TOPICALLY TO THE AFFECTED AREA TWICE DAILY 80 g 0  . valACYclovir (VALTREX) 1000 MG tablet Take 1 tablet (1,000 mg total) by mouth daily as needed. 30 tablet 0  . vitamin B-12 (CYANOCOBALAMIN) 100 MCG tablet Take 50 mcg by mouth as needed.      No current facility-administered medications for this visit.     OBJECTIVE: Middle-aged white woman in no acute distress Vitals:   07/22/18 1052  BP: (!) 159/75  Pulse: 68  Resp: 18  Temp: 98.9 F (37.2 C)  SpO2: 100%     Body mass index is 27.41 kg/m.    ECOG FS:1 - Symptomatic but completely ambulatory  Sclerae unicteric, EOMs intact Wearing a mask No cervical or supraclavicular adenopathy Lungs no rales or rhonchi Heart regular rate and rhythm Abd soft, nontender, positive bowel sounds MSK no focal spinal tenderness, no upper extremity lymphedema Neuro: nonfocal, well oriented, appropriate affect Breasts: The right breast is unremarkable.  The left breast is status post mastectomy and radiation.  There is no evidence of local recurrence.  Both axillae are benign.       LAB RESULTS:  CMP     Component Value Date/Time   NA 140 06/17/2017 1131   NA 143 06/02/2016 1104   K 3.7 06/17/2017 1131   K 4.1 06/02/2016 1104   CL 105 06/17/2017 1131   CO2 28 06/17/2017 1131   CO2 27 06/02/2016 1104   GLUCOSE 86 06/17/2017 1131   GLUCOSE 98 06/02/2016 1104   BUN 13 06/17/2017 1131   BUN 12.7 06/02/2016 1104   CREATININE 0.79 06/17/2017 1131   CREATININE 0.8 06/02/2016 1104   CALCIUM 9.8 06/17/2017 1131   CALCIUM 9.6 06/02/2016 1104   PROT 7.0 06/17/2017 1131   PROT 6.6 06/02/2016 1104   ALBUMIN 3.7 06/17/2017 1131   ALBUMIN 3.5 06/02/2016 1104   AST 18 06/17/2017 1131   AST 20 06/02/2016 1104   ALT 9 06/17/2017 1131   ALT 14 06/02/2016 1104   ALKPHOS 113 06/17/2017 1131   ALKPHOS 96 06/02/2016 1104   BILITOT 0.8 06/17/2017 1131   BILITOT 1.07 06/02/2016 1104   GFRNONAA >60 06/17/2017 1131    GFRAA >60 06/17/2017 1131    I No results found for: SPEP  Lab Results  Component Value Date   WBC 6.9 08/11/2017   NEUTROABS 4.2 08/11/2017   HGB 13.0 08/11/2017   HCT 38.4 08/11/2017   MCV 88.0 08/11/2017   PLT 194.0 08/11/2017      Chemistry  Component Value Date/Time   NA 140 06/17/2017 1131   NA 143 06/02/2016 1104   K 3.7 06/17/2017 1131   K 4.1 06/02/2016 1104   CL 105 06/17/2017 1131   CO2 28 06/17/2017 1131   CO2 27 06/02/2016 1104   BUN 13 06/17/2017 1131   BUN 12.7 06/02/2016 1104   CREATININE 0.79 06/17/2017 1131   CREATININE 0.8 06/02/2016 1104      Component Value Date/Time   CALCIUM 9.8 06/17/2017 1131   CALCIUM 9.6 06/02/2016 1104   ALKPHOS 113 06/17/2017 1131   ALKPHOS 96 06/02/2016 1104   AST 18 06/17/2017 1131   AST 20 06/02/2016 1104   ALT 9 06/17/2017 1131   ALT 14 06/02/2016 1104   BILITOT 0.8 06/17/2017 1131   BILITOT 1.07 06/02/2016 1104       No results found for: LABCA2  No components found for: LABCA125  No results for input(s): INR in the last 168 hours.  Urinalysis    Component Value Date/Time   COLORURINE YELLOW 08/11/2017 1106   APPEARANCEUR CLEAR 08/11/2017 1106   LABSPEC 1.015 08/11/2017 1106   PHURINE 6.5 08/11/2017 1106   GLUCOSEU NEGATIVE 08/11/2017 1106   HGBUR NEGATIVE 08/11/2017 1106   HGBUR negative 09/11/2009 1413   BILIRUBINUR NEGATIVE 08/11/2017 1106   BILIRUBINUR neg 02/17/2011 1625   KETONESUR NEGATIVE 08/11/2017 1106   PROTEINUR neg 02/17/2011 1625   PROTEINUR NEGATIVE 12/26/2008 1615   UROBILINOGEN 0.2 08/11/2017 1106   NITRITE NEGATIVE 08/11/2017 1106   LEUKOCYTESUR TRACE (A) 08/11/2017 1106    STUDIES:  No results found.   ASSESSMENT: 73 y.o. [place] woman status post biopsy of 2 separate left breast masses 06/22/2013 for a clinical mT1c N0, stage IA carcinoma, grade 1 or 2, estrogen receptor 93% positive, progesterone receptor 91% positive, with an MIB-1 of 11%, and no HER-2  amplification  (1) status post left mastectomy and sentinel lymph node sampling 08/04/2013 for a pT2 pN1, stage IIB invasive ductal carcinoma, grade 2, with negative margins, and repeat HER-2 again negative  (2) Oncotype score of 13 predicts a risk of recurrence or mortality within 5 years of 10% with tamoxifen alone, and 11% with tamoxifen plus chemotherapy; the patient was offered chemotherapy vs. participation in S007, but decided against both  (3) tamoxifen started 09/22/2013  (4) radiation  completed 11/30/2013  Left chest wall/reconstructed area/axilla -45 gray in 25 fraction  Additional boost to the left chest wall/reconstructed area 5.4 gray for chemotherapy dose of 50.4 gray  (5)  Completed reconstruction 05/30/2014, namely: 1. Removal left tissue expander and placement silicone implant 2. Lipofilling to left chest 3. Acellular dermis for breast reconstruction left, total 50 cm2 4. Right mastopexy for symmetry 5. Excision right axillary lipoma 6 cm  PLAN:  Katrianna is now 5 years out from definitive surgery for her breast cancer with no evidence of disease recurrence.  This is very favorable.  She has tolerated tamoxifen well, with no significant complications, and she has completed 5 years.  We discussed the fact that the additional 5 years that some patients opt for can be helpful in terms of bone density issues and also would reduce the risk of breast cancer by 2 or 3% perhaps but there are of course some negatives including the possibility of blood clots and the possibility of endometrial carcinoma.  I am very comfortable with her stopping tamoxifen at this point and this is also her choice.  I discussed the survivorship program with her and she is  interested in participating.  I will make sure she has an appointment in August of next year with my physician's assistant  At this point I feel comfortable releasing her to her primary care physician.  All she will need in  terms of breast cancer follow-up is her yearly mammography preferably with tomography and a yearly physician breast and chest wall exam  I will be glad to see [name] again at any point in the future if and when the need arises but as of now are making no further routine appointments for her with me here  Sarajane Jews C. Alexus Galka, MD 07/22/2018 11:13 AM Oncology and Hematology [facility] Tel. [phone]  FAX [phone]   I, Wilburn Mylar, am acting as scribe for Dr. Virgie Dad. Jonesha Tsuchiya.  I, Lurline Del MD, have reviewed the above documentation for accuracy and completeness, and I agree with the above.

## 2018-07-22 ENCOUNTER — Other Ambulatory Visit: Payer: Self-pay

## 2018-07-22 ENCOUNTER — Inpatient Hospital Stay: Payer: BC Managed Care – PPO | Attending: Oncology | Admitting: Oncology

## 2018-07-22 VITALS — BP 159/75 | HR 68 | Temp 98.9°F | Resp 18 | Wt 159.7 lb

## 2018-07-22 DIAGNOSIS — Z780 Asymptomatic menopausal state: Secondary | ICD-10-CM | POA: Diagnosis not present

## 2018-07-22 DIAGNOSIS — C50412 Malignant neoplasm of upper-outer quadrant of left female breast: Secondary | ICD-10-CM

## 2018-07-22 DIAGNOSIS — Z853 Personal history of malignant neoplasm of breast: Secondary | ICD-10-CM

## 2018-07-22 DIAGNOSIS — Z9012 Acquired absence of left breast and nipple: Secondary | ICD-10-CM | POA: Insufficient documentation

## 2018-07-22 DIAGNOSIS — M8580 Other specified disorders of bone density and structure, unspecified site: Secondary | ICD-10-CM

## 2018-07-22 DIAGNOSIS — K219 Gastro-esophageal reflux disease without esophagitis: Secondary | ICD-10-CM | POA: Insufficient documentation

## 2018-07-22 DIAGNOSIS — I10 Essential (primary) hypertension: Secondary | ICD-10-CM | POA: Diagnosis not present

## 2018-07-22 DIAGNOSIS — Z9223 Personal history of estrogen therapy: Secondary | ICD-10-CM | POA: Diagnosis not present

## 2018-07-22 DIAGNOSIS — F419 Anxiety disorder, unspecified: Secondary | ICD-10-CM | POA: Diagnosis not present

## 2018-07-22 DIAGNOSIS — M1990 Unspecified osteoarthritis, unspecified site: Secondary | ICD-10-CM | POA: Diagnosis not present

## 2018-07-22 DIAGNOSIS — Z8249 Family history of ischemic heart disease and other diseases of the circulatory system: Secondary | ICD-10-CM

## 2018-07-22 DIAGNOSIS — Z79899 Other long term (current) drug therapy: Secondary | ICD-10-CM

## 2018-07-22 DIAGNOSIS — Z170 Estrogen receptor positive status [ER+]: Secondary | ICD-10-CM | POA: Insufficient documentation

## 2018-07-22 DIAGNOSIS — Z923 Personal history of irradiation: Secondary | ICD-10-CM

## 2018-07-22 DIAGNOSIS — Z803 Family history of malignant neoplasm of breast: Secondary | ICD-10-CM

## 2018-07-22 DIAGNOSIS — R232 Flushing: Secondary | ICD-10-CM

## 2018-07-27 ENCOUNTER — Other Ambulatory Visit: Payer: Self-pay | Admitting: Internal Medicine

## 2018-07-27 ENCOUNTER — Other Ambulatory Visit: Payer: Self-pay | Admitting: Oncology

## 2018-07-27 DIAGNOSIS — Z1231 Encounter for screening mammogram for malignant neoplasm of breast: Secondary | ICD-10-CM

## 2018-08-12 ENCOUNTER — Other Ambulatory Visit (INDEPENDENT_AMBULATORY_CARE_PROVIDER_SITE_OTHER): Payer: BC Managed Care – PPO

## 2018-08-12 DIAGNOSIS — Z0000 Encounter for general adult medical examination without abnormal findings: Secondary | ICD-10-CM | POA: Diagnosis not present

## 2018-08-12 LAB — URINALYSIS, ROUTINE W REFLEX MICROSCOPIC
Bilirubin Urine: NEGATIVE
Hgb urine dipstick: NEGATIVE
Ketones, ur: NEGATIVE
Nitrite: NEGATIVE
Specific Gravity, Urine: 1.02 (ref 1.000–1.030)
Total Protein, Urine: NEGATIVE
Urine Glucose: NEGATIVE
Urobilinogen, UA: 0.2 (ref 0.0–1.0)
pH: 6.5 (ref 5.0–8.0)

## 2018-08-12 LAB — CBC WITH DIFFERENTIAL/PLATELET
Basophils Absolute: 0.1 10*3/uL (ref 0.0–0.1)
Basophils Relative: 1 % (ref 0.0–3.0)
Eosinophils Absolute: 0.2 10*3/uL (ref 0.0–0.7)
Eosinophils Relative: 2.9 % (ref 0.0–5.0)
HCT: 36.9 % (ref 36.0–46.0)
Hemoglobin: 12.6 g/dL (ref 12.0–15.0)
Lymphocytes Relative: 22.6 % (ref 12.0–46.0)
Lymphs Abs: 1.4 10*3/uL (ref 0.7–4.0)
MCHC: 34.1 g/dL (ref 30.0–36.0)
MCV: 88.1 fl (ref 78.0–100.0)
Monocytes Absolute: 0.9 10*3/uL (ref 0.1–1.0)
Monocytes Relative: 14.3 % — ABNORMAL HIGH (ref 3.0–12.0)
Neutro Abs: 3.7 10*3/uL (ref 1.4–7.7)
Neutrophils Relative %: 59.2 % (ref 43.0–77.0)
Platelets: 181 10*3/uL (ref 150.0–400.0)
RBC: 4.19 Mil/uL (ref 3.87–5.11)
RDW: 12.6 % (ref 11.5–15.5)
WBC: 6.3 10*3/uL (ref 4.0–10.5)

## 2018-08-12 LAB — BASIC METABOLIC PANEL
BUN: 13 mg/dL (ref 6–23)
CO2: 27 mEq/L (ref 19–32)
Calcium: 9.9 mg/dL (ref 8.4–10.5)
Chloride: 107 mEq/L (ref 96–112)
Creatinine, Ser: 0.75 mg/dL (ref 0.40–1.20)
GFR: 75.87 mL/min (ref >60.00)
Glucose, Bld: 102 mg/dL — ABNORMAL HIGH (ref 70–99)
Potassium: 4.1 mEq/L (ref 3.5–5.1)
Sodium: 142 mEq/L (ref 135–145)

## 2018-08-12 LAB — LIPID PANEL
Cholesterol: 188 mg/dL (ref 0–200)
HDL: 53 mg/dL (ref >39.00)
NonHDL: 134.57
Total CHOL/HDL Ratio: 4
Triglycerides: 287 mg/dL — ABNORMAL HIGH (ref 0.0–149.0)
VLDL: 57.4 mg/dL — ABNORMAL HIGH (ref 0.0–40.0)

## 2018-08-12 LAB — LDL CHOLESTEROL, DIRECT: Direct LDL: 84 mg/dL

## 2018-08-12 LAB — HEPATIC FUNCTION PANEL
ALT: 11 U/L (ref 0–35)
AST: 18 U/L (ref 0–37)
Albumin: 3.9 g/dL (ref 3.5–5.2)
Alkaline Phosphatase: 93 U/L (ref 39–117)
Bilirubin, Direct: 0.1 mg/dL (ref 0.0–0.3)
Total Bilirubin: 0.7 mg/dL (ref 0.2–1.2)
Total Protein: 6.6 g/dL (ref 6.0–8.3)

## 2018-08-12 LAB — TSH: TSH: 2.01 u[IU]/mL (ref 0.35–4.50)

## 2018-08-17 ENCOUNTER — Encounter: Payer: Self-pay | Admitting: Internal Medicine

## 2018-08-17 ENCOUNTER — Ambulatory Visit (INDEPENDENT_AMBULATORY_CARE_PROVIDER_SITE_OTHER): Payer: BC Managed Care – PPO | Admitting: Internal Medicine

## 2018-08-17 ENCOUNTER — Other Ambulatory Visit: Payer: Self-pay

## 2018-08-17 DIAGNOSIS — Z0000 Encounter for general adult medical examination without abnormal findings: Secondary | ICD-10-CM | POA: Diagnosis not present

## 2018-08-17 DIAGNOSIS — I10 Essential (primary) hypertension: Secondary | ICD-10-CM

## 2018-08-17 MED ORDER — TRIAMCINOLONE ACETONIDE 0.1 % EX CREA: TOPICAL_CREAM | CUTANEOUS | 0 refills | Status: DC

## 2018-08-17 NOTE — Progress Notes (Signed)
Subjective:    Patient ID: Deanna Buck, female    DOB: 1945/07/30, 73 y.o.   MRN: [id number]  HPI  Here for wellness and f/u;  Overall doing ok;  Pt denies Chest pain, worsening SOB, DOE, wheezing, orthopnea, PND, worsening LE edema, palpitations, dizziness or syncope.  Pt denies neurological change such as new headache, facial or extremity weakness.  Pt denies polydipsia, polyuria, or low sugar symptoms. Pt states overall good compliance with treatment and medications, good tolerability, and has been trying to follow appropriate diet.  Pt denies worsening depressive symptoms, suicidal ideation or panic. No fever, night sweats, wt loss, loss of appetite, or other constitutional symptoms.  Pt states good ability with ADL's, has low fall risk, home safety reviewed and adequate, no other significant changes in hearing or vision, and only occasionally active with exercise. Pt continues to have recurring left LBP with occasional sciatica, but no bowel or bladder change, fever, wt loss,  worsening LE numbness/weakness, gait change or falls. Past Medical History:  Diagnosis Date   Abnormal mammogram 05/2013   L breast   ANXIETY    Breast cancer ([place])    left breast   ECZEMA    Full dentures    GERD    Hypertension    Pt states she takes Nadolol for Anxiety, not BP   OA (osteoarthritis)    Radiation 10/24/13-11/30/13   left chest wall and axilla   Wears contact lenses    also wears glasses   Past Surgical History:  Procedure Laterality Date   BREAST IMPLANT REMOVAL Left 10/24/2014   Procedure: REMOVAL LEFT BREAST IMPLANT, Excision of Nipple Areola Left Breast;  Surgeon: [name], MD;  Location: [facility];  Service: Plastics;  Laterality: Left;   BREAST LUMPECTOMY  1982   rt and lt benign   BREAST RECONSTRUCTION WITH PLACEMENT OF TISSUE EXPANDER AND FLEX HD (ACELLULAR HYDRATED DERMIS) Left 08/04/2013   Procedure: BREAST RECONSTRUCTION WITH PLACEMENT OF  TISSUE EXPANDER AND  POSSIBLE FLEX HD (ACELLULAR HYDRATED DERMIS);  Surgeon: [name], MD;  Location: [facility];  Service: Plastics;  Laterality: Left;   INCISION AND DRAINAGE OF WOUND Left 08/30/2013   Procedure: DEBRIDEMENT OF LEFT BREAST;  Surgeon: [name], MD;  Location: [facility];  Service: Plastics;  Laterality: Left;   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, had a second breast biopsy (R) in 2007   LIPOSUCTION WITH LIPOFILLING Left 05/30/2014   Procedure: LIPOFILLING TO LEFT CHEST;  Surgeon: [name], MD;  Location: [facility];  Service: Plastics;  Laterality: Left;   MASS EXCISION Right 05/30/2014   Procedure: EXCISION OF RIGHT AXILLARY LIPOMA;  Surgeon: [name], MD;  Location: [facility];  Service: Plastics;  Laterality: Right;   MASTECTOMY Left    MASTOPEXY Right 05/30/2014   Procedure: RIGHT MASTOPEXY FOR SYMMETRY;  Surgeon: [name], MD;  Location: [facility];  Service: Plastics;  Laterality: Right;   REDUCTION MAMMAPLASTY Right 1980   REMOVAL OF TISSUE EXPANDER AND PLACEMENT OF IMPLANT Left 05/30/2014   Procedure: REMOVAL OF LEFT TISSUE EXPANDER AND PLACEMENT OF IMPLANT;  Surgeon: [name], MD;  Location: [facility];  Service: Plastics;  Laterality: Left;    reports that she has never smoked. She has never used smokeless tobacco. She reports current alcohol use. She reports that she does not use drugs. family history includes Arthritis in an other  family member; Breast cancer in her mother; Dementia in her mother; Hyperlipidemia in an other family member; Hypertension in an other family member; Kidney disease in an other family member. Allergies  Allergen Reactions   Morphine And Related Shortness Of Breath   Sulfonamide Derivatives Hives and Itching   Banana Other (See Comments)    Pain   Penicillins Other (See  Comments)    Dr. Sherolyn Buba told her she was allergic to PCN.   Current Outpatient Medications on File Prior to Visit  Medication Sig Dispense Refill   Cholecalciferol (VITAMIN D) 2000 units tablet Take 2,000 Units by mouth daily.     hydrochlorothiazide (HYDRODIURIL) 25 MG tablet Take 1 tablet (25 mg total) by mouth daily. 90 tablet 3   KLOR-CON M10 10 MEQ tablet TAKE 2 TABLETS (=20MEQ)    DAILY 180 tablet 1   nadolol (CORGARD) 40 MG tablet TAKE 1/2 TABLET (20 MG     TOTAL) DAILY. 45 tablet 2   non-metallic deodorant (ALRA) MISC Apply 1 application topically daily as needed.     valACYclovir (VALTREX) 1000 MG tablet Take 1 tablet (1,000 mg total) by mouth daily as needed. 30 tablet 0   vitamin B-12 (CYANOCOBALAMIN) 100 MCG tablet Take 50 mcg by mouth as needed.      No current facility-administered medications on file prior to visit.    Review of Systems Constitutional: Negative for other unusual diaphoresis, sweats, appetite or weight changes HENT: Negative for other worsening hearing loss, ear pain, facial swelling, mouth sores or neck stiffness.   Eyes: Negative for other worsening pain, redness or other visual disturbance.  Respiratory: Negative for other stridor or swelling Cardiovascular: Negative for other palpitations or other chest pain  Gastrointestinal: Negative for worsening diarrhea or loose stools, blood in stool, distention or other pain Genitourinary: Negative for hematuria, flank pain or other change in urine volume.  Musculoskeletal: Negative for myalgias or other joint swelling.  Skin: Negative for other color change, or other wound or worsening drainage.  Neurological: Negative for other syncope or numbness. Hematological: Negative for other adenopathy or swelling Psychiatric/Behavioral: Negative for hallucinations, other worsening agitation, SI, self-injury, or new decreased concentration All other system neg per pt    Objective:   Physical Exam BP (!)  142/88    Pulse 63    Temp 98.4 F (36.9 C) (Oral)    Resp 14    Ht 5\' 4"  (1.626 m)    Wt 157 lb (71.2 kg)    SpO2 97%    BMI 26.95 kg/m  VS noted,  Constitutional: Pt is oriented to person, place, and time. Appears well-developed and well-nourished, in no significant distress and comfortable Head: Normocephalic and atraumatic  Eyes: Conjunctivae and EOM are normal. Pupils are equal, round, and reactive to light Right Ear: External ear normal without discharge Left Ear: External ear normal without discharge Nose: Nose without discharge or deformity Mouth/Throat: Oropharynx is without other ulcerations and moist  Neck: Normal range of motion. Neck supple. No JVD present. No tracheal deviation present or significant neck LA or mass Cardiovascular: Normal rate, regular rhythm, normal heart sounds and intact distal pulses.   Pulmonary/Chest: WOB normal and breath sounds without rales or wheezing  Abdominal: Soft. Bowel sounds are normal. NT. No HSM  Musculoskeletal: Normal range of motion. Exhibits no edema Lymphadenopathy: Has no other cervical adenopathy.  Neurological: Pt is alert and oriented to person, place, and time. Pt has normal reflexes. No cranial nerve deficit. Motor grossly  intact, Gait intact Skin: Skin is warm and dry. No rash noted or new ulcerations Psychiatric:  Has normal mood and affect. Behavior is normal without agitation No other exam findings Lab Results  Component Value Date   WBC 6.3 08/12/2018   HGB 12.6 08/12/2018   HCT 36.9 08/12/2018   PLT 181.0 08/12/2018   GLUCOSE 102 (H) 08/12/2018   CHOL 188 08/12/2018   TRIG 287.0 (H) 08/12/2018   HDL 53.00 08/12/2018   LDLDIRECT 84.0 08/12/2018   LDLCALC 92 07/30/2015   ALT 11 08/12/2018   AST 18 08/12/2018   NA 142 08/12/2018   K 4.1 08/12/2018   CL 107 08/12/2018   CREATININE 0.75 08/12/2018   BUN 13 08/12/2018   CO2 27 08/12/2018   TSH 2.01 08/12/2018      Assessment & Plan:

## 2018-08-17 NOTE — Assessment & Plan Note (Signed)
stable overall by history and exam, recent data reviewed with pt, and pt to continue medical treatment as before,  to f/u any worsening symptoms or concerns  

## 2018-08-17 NOTE — Assessment & Plan Note (Signed)

## 2018-08-17 NOTE — Patient Instructions (Signed)
Please continue all other medications as before, and refills have been done if requested.  Please have the pharmacy call with any other refills you may need.  Please continue your efforts at being more active, low cholesterol diet, and weight control.  You are otherwise up to date with prevention measures today.  Please keep your appointments with your specialists as you may have planned  Please return in 1 year for your yearly visit, or sooner if needed, with Lab testing done 3-5 days before  

## 2018-09-08 ENCOUNTER — Ambulatory Visit
Admission: RE | Admit: 2018-09-08 | Discharge: 2018-09-08 | Disposition: A | Discharge status: Home / Self Care | Payer: BC Managed Care – PPO | Source: Ambulatory Visit | Attending: Oncology | Admitting: Oncology

## 2018-09-08 ENCOUNTER — Other Ambulatory Visit: Payer: Self-pay

## 2018-09-08 DIAGNOSIS — Z1231 Encounter for screening mammogram for malignant neoplasm of breast: Secondary | ICD-10-CM

## 2018-10-11 ENCOUNTER — Ambulatory Visit (INDEPENDENT_AMBULATORY_CARE_PROVIDER_SITE_OTHER): Payer: BC Managed Care – PPO

## 2018-10-11 ENCOUNTER — Telehealth: Payer: Self-pay | Admitting: Internal Medicine

## 2018-10-11 ENCOUNTER — Other Ambulatory Visit: Payer: Self-pay

## 2018-10-11 DIAGNOSIS — Z23 Encounter for immunization: Secondary | ICD-10-CM | POA: Diagnosis not present

## 2018-10-11 MED ORDER — TRIAMCINOLONE ACETONIDE 0.1 % EX CREA: TOPICAL_CREAM | CUTANEOUS | 0 refills | Status: DC

## 2018-10-11 NOTE — Telephone Encounter (Signed)
Patient was here in the office for her flu shot and mentioned that when the prescription for triamcinolone cream (KENALOG) 0.1 % was sent in it when to [facility]. They would not fill the 30 day supply and said they have been working on contacting us and her local pharmacy but she has not heard anything.  She would like for this prescription to be sent to [facility] on [address] if possible.

## 2018-10-18 ENCOUNTER — Other Ambulatory Visit: Payer: Self-pay | Admitting: Internal Medicine

## 2018-10-18 DIAGNOSIS — E876 Hypokalemia: Secondary | ICD-10-CM

## 2018-12-21 ENCOUNTER — Other Ambulatory Visit: Payer: Self-pay | Admitting: Internal Medicine

## 2019-01-31 ENCOUNTER — Encounter: Payer: Self-pay | Admitting: Internal Medicine

## 2019-02-26 ENCOUNTER — Ambulatory Visit: Payer: Medicare Other | Attending: Internal Medicine

## 2019-02-26 DIAGNOSIS — Z23 Encounter for immunization: Secondary | ICD-10-CM

## 2019-02-26 NOTE — Progress Notes (Signed)
   Covid-19 Vaccination Clinic  Name:  Deanna Buck    MRN: [id number] DOB: 25-Dec-1945  02/26/2019  [name] was observed post Covid-19 immunization for 15 minutes without incidence. She was provided with Vaccine Information Sheet and instruction to access the V-Safe system.   [name] was instructed to call 911 with any severe reactions post vaccine: Marland Kitchen Difficulty breathing  . Swelling of your face and throat  . A fast heartbeat  . A bad rash all over your body  . Dizziness and weakness    Immunizations Administered    Name Date Dose VIS Date Route   Pfizer COVID-19 Vaccine 02/26/2019  8:55 AM 0.3 mL 12/24/2018 Intramuscular   Manufacturer: [place]   Lot: [id number]   [place]: [id number]

## 2019-03-20 ENCOUNTER — Ambulatory Visit: Payer: Self-pay | Attending: Internal Medicine

## 2019-03-20 DIAGNOSIS — Z23 Encounter for immunization: Secondary | ICD-10-CM | POA: Insufficient documentation

## 2019-03-20 NOTE — Progress Notes (Signed)
   Covid-19 Vaccination Clinic  Name:  Deanna Buck    MRN: [id number] DOB: 1945-10-08  03/20/2019  Deanna Buck was observed post Covid-19 immunization for 15 minutes without incident. She was provided with Vaccine Information Sheet and instruction to access the V-Safe system.   Deanna Buck was instructed to call 911 with any severe reactions post vaccine: Marland Kitchen Difficulty breathing  . Swelling of face and throat  . A fast heartbeat  . A bad rash all over body  . Dizziness and weakness   Immunizations Administered    Name Date Dose VIS Date Route   Pfizer COVID-19 Vaccine 03/20/2019 12:04 PM 0.3 mL 12/24/2018 Intramuscular   Manufacturer: [place]   Lot: [id number]   [place]: [id number]

## 2019-04-07 ENCOUNTER — Other Ambulatory Visit: Payer: Self-pay | Admitting: Internal Medicine

## 2019-07-15 ENCOUNTER — Telehealth: Payer: Self-pay | Admitting: Adult Health

## 2019-07-15 NOTE — Telephone Encounter (Signed)
Called pt to reschedule appt on 8/10. Pt wished to cancel appt and will call back if she decides to reschedule.

## 2019-08-16 ENCOUNTER — Telehealth: Payer: Self-pay | Admitting: Internal Medicine

## 2019-08-16 NOTE — Telephone Encounter (Signed)
    Patient calling to request order for annual labs prior to 8/6 appointment

## 2019-08-17 ENCOUNTER — Other Ambulatory Visit: Payer: Self-pay | Admitting: Internal Medicine

## 2019-08-17 DIAGNOSIS — Z1231 Encounter for screening mammogram for malignant neoplasm of breast: Secondary | ICD-10-CM

## 2019-08-17 NOTE — Telephone Encounter (Signed)
Patient requesting order to have annual labs done today

## 2019-08-19 ENCOUNTER — Ambulatory Visit (INDEPENDENT_AMBULATORY_CARE_PROVIDER_SITE_OTHER): Payer: No Typology Code available for payment source | Admitting: Internal Medicine

## 2019-08-19 ENCOUNTER — Encounter: Payer: Self-pay | Admitting: Internal Medicine

## 2019-08-19 ENCOUNTER — Other Ambulatory Visit: Payer: Self-pay

## 2019-08-19 VITALS — BP 140/80 | HR 52 | Temp 98.5°F | Ht 64.0 in | Wt 163.0 lb

## 2019-08-19 DIAGNOSIS — E538 Deficiency of other specified B group vitamins: Secondary | ICD-10-CM | POA: Diagnosis not present

## 2019-08-19 DIAGNOSIS — Z0000 Encounter for general adult medical examination without abnormal findings: Secondary | ICD-10-CM

## 2019-08-19 DIAGNOSIS — E559 Vitamin D deficiency, unspecified: Secondary | ICD-10-CM

## 2019-08-19 DIAGNOSIS — I10 Essential (primary) hypertension: Secondary | ICD-10-CM

## 2019-08-19 NOTE — Assessment & Plan Note (Signed)

## 2019-08-19 NOTE — Patient Instructions (Signed)
Please check to see if the cologuard is covered and let us know to sign up for this  If not, please let us know to be referred for colonoscopy  Please continue all other medications as before, and refills have been done if requested.  Please have the pharmacy call with any other refills you may need.  Please continue your efforts at being more active, low cholesterol diet, and weight control.  You are otherwise up to date with prevention measures today.  Please keep your appointments with your specialists as you may have planned  Please go to the LAB at the blood drawing area for the tests to be done  You will be contacted by phone if any changes need to be made immediately.  Otherwise, you will receive a letter about your results with an explanation, but please check with MyChart first.  Please remember to sign up for MyChart if you have not done so, as this will be important to you in the future with finding out test results, communicating by private email, and scheduling acute appointments online when needed.  Please make an Appointment to return for your 1 year visit, or sooner if needed, with Lab testing by Appointment as well, to be done about 3-5 days before at the [facility] (so this is for TWO appointments - please see the scheduling desk as you leave)

## 2019-08-19 NOTE — Progress Notes (Signed)
Subjective:    Patient ID: Deanna Buck, female    DOB: 1945-03-18, 74 y.o.   MRN: [id number]  HPI  Here for wellness and f/u;  Overall doing ok;  Pt denies Chest pain, worsening SOB, DOE, wheezing, orthopnea, PND, worsening LE edema, palpitations, dizziness or syncope.  Pt denies neurological change such as new headache, facial or extremity weakness.  Pt denies polydipsia, polyuria, or low sugar symptoms. Pt states overall good compliance with treatment and medications, good tolerability, and has been trying to follow appropriate diet.  Pt denies worsening depressive symptoms, suicidal ideation or panic. No fever, night sweats, wt loss, loss of appetite, or other constitutional symptoms.  Pt states good ability with ADL's, has low fall risk, home safety reviewed and adequate, no other significant changes in hearing or vision, and only occasionally active with exercise.  BP at home < 140/90. Wt Readings from Last 3 Encounters:  08/19/19 163 lb (73.9 kg)  08/17/18 157 lb (71.2 kg)  07/22/18 159 lb 11.2 oz (72.4 kg)   Past Medical History:  Diagnosis Date  . Abnormal mammogram 05/2013   L breast  . ANXIETY   . Breast cancer ([place])    left breast  . ECZEMA   . Full dentures   . GERD   . Hypertension    Pt states she takes Nadolol for Anxiety, not BP  . OA (osteoarthritis)   . Radiation 10/24/13-11/30/13   left chest wall and axilla  . Wears contact lenses    also wears glasses   Past Surgical History:  Procedure Laterality Date  . BREAST IMPLANT REMOVAL Left 10/24/2014   Procedure: REMOVAL LEFT BREAST IMPLANT, Excision of Nipple Areola Left Breast;  Surgeon: [name], MD;  Location: [facility];  Service: Plastics;  Laterality: Left;  . BREAST LUMPECTOMY  1982   rt and lt benign  . BREAST RECONSTRUCTION WITH PLACEMENT OF TISSUE EXPANDER AND FLEX HD (ACELLULAR HYDRATED DERMIS) Left 08/04/2013   Procedure: BREAST RECONSTRUCTION WITH PLACEMENT OF TISSUE EXPANDER AND   POSSIBLE FLEX HD (ACELLULAR HYDRATED DERMIS);  Surgeon: [name], MD;  Location: [facility];  Service: Plastics;  Laterality: Left;  . INCISION AND DRAINAGE OF WOUND Left 08/30/2013   Procedure: DEBRIDEMENT OF LEFT BREAST;  Surgeon: [name], MD;  Location: [place];  Service: Plastics;  Laterality: Left;  . Kidney repair     [phone] for a congenital malformation, had a second breast biopsy (R) in 2007  . LIPOSUCTION WITH LIPOFILLING Left 05/30/2014   Procedure: LIPOFILLING TO LEFT CHEST;  Surgeon: [name], MD;  Location: [place];  Service: Plastics;  Laterality: Left;  Marland Kitchen MASS EXCISION Right 05/30/2014   Procedure: EXCISION OF RIGHT AXILLARY LIPOMA;  Surgeon: [name], MD;  Location: [place];  Service: Plastics;  Laterality: Right;  . MASTECTOMY Left   . MASTOPEXY Right 05/30/2014   Procedure: RIGHT MASTOPEXY FOR SYMMETRY;  Surgeon: [name], MD;  Location: [place];  Service: Plastics;  Laterality: Right;  . REDUCTION MAMMAPLASTY Right 1980  . REMOVAL OF TISSUE EXPANDER AND PLACEMENT OF IMPLANT Left 05/30/2014   Procedure: REMOVAL OF LEFT TISSUE EXPANDER AND PLACEMENT OF IMPLANT;  Surgeon: [name], MD;  Location: [place];  Service: Plastics;  Laterality: Left;    reports that she has never smoked. She has never used smokeless tobacco. She reports current alcohol use. She reports that she does not use drugs.  family history includes Arthritis in an other family member; Breast cancer in her mother; Dementia in her mother; Hyperlipidemia in an other family member; Hypertension in an other family member; Kidney disease in an other family member. Allergies  Allergen Reactions  . Morphine And Related Shortness Of Breath  . Sulfonamide Derivatives Hives and Itching  . Banana Other (See Comments)    Pain  . Penicillins Other (See Comments)    Dr. Sherolyn Buba told her she was allergic to PCN.   Current Outpatient Medications on File Prior to Visit  Medication Sig Dispense Refill  . Cholecalciferol (VITAMIN D) 2000 units tablet Take 2,000 Units by mouth daily.    . hydrochlorothiazide (HYDRODIURIL) 25 MG tablet TAKE 1 TABLET DAILY 90 tablet 3  . KLOR-CON M10 10 MEQ tablet TAKE 2 TABLETS (=20MEQ)    DAILY 180 tablet 3  . nadolol (CORGARD) 40 MG tablet TAKE 1/2 TABLET (20 MG     TOTAL) DAILY. 45 tablet 1  . non-metallic deodorant (ALRA) MISC Apply 1 application topically daily as needed.    . triamcinolone cream (KENALOG) 0.1 % APPLY TOPICALLY TO THE AFFECTED AREA TWICE DAILY 80 g 0  . valACYclovir (VALTREX) 1000 MG tablet Take 1 tablet (1,000 mg total) by mouth daily as needed. 30 tablet 0  . vitamin B-12 (CYANOCOBALAMIN) 100 MCG tablet Take 50 mcg by mouth as needed.      No current facility-administered medications on file prior to visit.   Review of Systems All otherwise neg per pt     Objective:   Physical Exam BP 140/80 (BP Location: Left Arm, Patient Position: Sitting, Cuff Size: Large)   Pulse (!) 52   Temp 98.5 F (36.9 C) (Oral)   Ht 5\' 4"  (1.626 m)   Wt 163 lb (73.9 kg)   SpO2 97%   BMI 27.98 kg/m  VS noted,  Constitutional: Pt appears in NAD HENT: Head: NCAT.  Right Ear: External ear normal.  Left Ear: External ear normal.  Eyes: . Pupils are equal, round, and reactive to light. Conjunctivae and EOM are normal Nose: without d/c or deformity Neck: Neck supple. Gross normal ROM Cardiovascular: Normal rate and regular rhythm.   Pulmonary/Chest: Effort normal and breath sounds without rales or wheezing.  Abd:  Soft, NT, ND, + BS, no organomegaly Neurological: Pt is alert. At baseline orientation, motor grossly intact Skin: Skin is warm. No rashes, other new lesions, no LE edema Psychiatric: Pt behavior is normal without agitation  All otherwise neg per pt Lab Results  Component Value Date   WBC 6.3 08/12/2018    HGB 12.6 08/12/2018   HCT 36.9 08/12/2018   PLT 181.0 08/12/2018   GLUCOSE 102 (H) 08/12/2018   CHOL 188 08/12/2018   TRIG 287.0 (H) 08/12/2018   HDL 53.00 08/12/2018   LDLDIRECT 84.0 08/12/2018   LDLCALC 92 07/30/2015   ALT 11 08/12/2018   AST 18 08/12/2018   NA 142 08/12/2018   K 4.1 08/12/2018   CL 107 08/12/2018   CREATININE 0.75 08/12/2018   BUN 13 08/12/2018   CO2 27 08/12/2018   TSH 2.01 08/12/2018      Assessment & Plan:

## 2019-08-19 NOTE — Assessment & Plan Note (Signed)
stable overall by history and exam, recent data reviewed with pt, and pt to continue medical treatment as before,  to f/u any worsening symptoms or concerns  

## 2019-08-20 ENCOUNTER — Encounter: Payer: Self-pay | Admitting: Internal Medicine

## 2019-08-20 LAB — CBC WITH DIFFERENTIAL/PLATELET
Absolute Monocytes: 744 cells/uL (ref 200–950)
Basophils Absolute: 72 cells/uL (ref 0–200)
Basophils Relative: 1.2 %
Eosinophils Absolute: 204 cells/uL (ref 15–500)
Eosinophils Relative: 3.4 %
HCT: 38.4 % (ref 35.0–45.0)
Hemoglobin: 12.6 g/dL (ref 11.7–15.5)
Lymphs Abs: 1266 cells/uL (ref 850–3900)
MCH: 29.9 pg (ref 27.0–33.0)
MCHC: 32.8 g/dL (ref 32.0–36.0)
MCV: 91 fL (ref 80.0–100.0)
MPV: 11.4 fL (ref 7.5–12.5)
Monocytes Relative: 12.4 %
Neutro Abs: 3714 cells/uL (ref 1500–7800)
Neutrophils Relative %: 61.9 %
Platelets: 188 10*3/uL (ref 140–400)
RBC: 4.22 10*6/uL (ref 3.80–5.10)
RDW: 12.3 % (ref 11.0–15.0)
Total Lymphocyte: 21.1 %
WBC: 6 10*3/uL (ref 3.8–10.8)

## 2019-08-20 LAB — LIPID PANEL
Cholesterol: 216 mg/dL — ABNORMAL HIGH (ref <200)
HDL: 66 mg/dL (ref >=50)
LDL Cholesterol (Calc): 133 mg/dL (calc) — ABNORMAL HIGH
Non-HDL Cholesterol (Calc): 150 mg/dL (calc) — ABNORMAL HIGH (ref <130)
Total CHOL/HDL Ratio: 3.3 (calc) (ref <5.0)
Triglycerides: 71 mg/dL (ref <150)

## 2019-08-20 LAB — COMPLETE METABOLIC PANEL WITH GFR
AG Ratio: 1.5 (calc) (ref 1.0–2.5)
ALT: 12 U/L (ref 6–29)
AST: 18 U/L (ref 10–35)
Albumin: 4 g/dL (ref 3.6–5.1)
Alkaline phosphatase (APISO): 129 U/L (ref 37–153)
BUN: 16 mg/dL (ref 7–25)
CO2: 25 mmol/L (ref 20–32)
Calcium: 10.2 mg/dL (ref 8.6–10.4)
Chloride: 106 mmol/L (ref 98–110)
Creat: 0.8 mg/dL (ref 0.60–0.93)
GFR, Est African American: 85 mL/min/{1.73_m2} (ref >=60)
GFR, Est Non African American: 73 mL/min/{1.73_m2} (ref >=60)
Globulin: 2.7 g/dL (calc) (ref 1.9–3.7)
Glucose, Bld: 98 mg/dL (ref 65–99)
Potassium: 4 mmol/L (ref 3.5–5.3)
Sodium: 142 mmol/L (ref 135–146)
Total Bilirubin: 1.2 mg/dL (ref 0.2–1.2)
Total Protein: 6.7 g/dL (ref 6.1–8.1)

## 2019-08-20 LAB — URINALYSIS, ROUTINE W REFLEX MICROSCOPIC
Bilirubin Urine: NEGATIVE
Glucose, UA: NEGATIVE
Hgb urine dipstick: NEGATIVE
Ketones, ur: NEGATIVE
Leukocytes,Ua: NEGATIVE
Nitrite: NEGATIVE
Protein, ur: NEGATIVE
Specific Gravity, Urine: 1.014 (ref 1.001–1.03)
pH: 6.5 (ref 5.0–8.0)

## 2019-08-20 LAB — VITAMIN B12: Vitamin B-12: >2000 pg/mL — ABNORMAL HIGH (ref 200–1100)

## 2019-08-20 LAB — VITAMIN D 25 HYDROXY (VIT D DEFICIENCY, FRACTURES): Vit D, 25-Hydroxy: 49 ng/mL (ref 30–100)

## 2019-08-20 LAB — TSH: TSH: 1.64 mIU/L (ref 0.40–4.50)

## 2019-08-22 ENCOUNTER — Encounter: Payer: Self-pay | Admitting: Internal Medicine

## 2019-08-22 ENCOUNTER — Other Ambulatory Visit: Payer: Self-pay | Admitting: Internal Medicine

## 2019-08-22 MED ORDER — ATORVASTATIN CALCIUM 20 MG PO TABS: 20.0000 mg | ORAL_TABLET | Freq: Every day | ORAL | 3 refills | Status: DC

## 2019-08-23 ENCOUNTER — Encounter: Payer: BC Managed Care – PPO | Admitting: Adult Health

## 2019-08-28 ENCOUNTER — Other Ambulatory Visit: Payer: Self-pay | Admitting: Internal Medicine

## 2019-08-28 DIAGNOSIS — R739 Hyperglycemia, unspecified: Secondary | ICD-10-CM

## 2019-08-28 DIAGNOSIS — Z0000 Encounter for general adult medical examination without abnormal findings: Secondary | ICD-10-CM

## 2019-09-15 ENCOUNTER — Other Ambulatory Visit: Payer: Self-pay

## 2019-09-15 ENCOUNTER — Ambulatory Visit
Admission: RE | Admit: 2019-09-15 | Discharge: 2019-09-15 | Disposition: A | Discharge status: Home / Self Care | Payer: No Typology Code available for payment source | Source: Ambulatory Visit | Attending: Internal Medicine | Admitting: Internal Medicine

## 2019-09-15 DIAGNOSIS — Z1231 Encounter for screening mammogram for malignant neoplasm of breast: Secondary | ICD-10-CM

## 2019-10-07 ENCOUNTER — Other Ambulatory Visit: Payer: Self-pay | Admitting: Internal Medicine

## 2019-10-07 DIAGNOSIS — E876 Hypokalemia: Secondary | ICD-10-CM

## 2019-11-21 ENCOUNTER — Ambulatory Visit: Payer: No Typology Code available for payment source

## 2019-11-26 ENCOUNTER — Ambulatory Visit (INDEPENDENT_AMBULATORY_CARE_PROVIDER_SITE_OTHER): Payer: No Typology Code available for payment source

## 2019-11-26 DIAGNOSIS — Z23 Encounter for immunization: Secondary | ICD-10-CM | POA: Diagnosis not present

## 2019-12-09 ENCOUNTER — Other Ambulatory Visit: Payer: Self-pay | Admitting: Internal Medicine

## 2019-12-10 NOTE — Telephone Encounter (Signed)
Please refill as per office routine med refill policy (all routine meds refilled for 3 mo or monthly per pt preference up to one year from last visit, then month to month grace period for 3 mo, then further med refills will have to be denied)  

## 2019-12-17 ENCOUNTER — Ambulatory Visit: Payer: No Typology Code available for payment source | Attending: Internal Medicine

## 2019-12-17 DIAGNOSIS — Z23 Encounter for immunization: Secondary | ICD-10-CM

## 2019-12-17 NOTE — Progress Notes (Signed)
   Covid-19 Vaccination Clinic  Name:  Deanna Buck    MRN: [id number] DOB: 26-Nov-1945  12/17/2019  [name] was observed post Covid-19 immunization for 15 minutes without incident. She was provided with Vaccine Information Sheet and instruction to access the V-Safe system.   [name] was instructed to call 911 with any severe reactions post vaccine: Marland Kitchen Difficulty breathing  . Swelling of face and throat  . A fast heartbeat  . A bad rash all over body  . Dizziness and weakness   Immunizations Administered    Name Date Dose VIS Date Route   Pfizer COVID-19 Vaccine 12/17/2019 11:27 AM 0.3 mL 11/02/2019 Intramuscular   Manufacturer: [place]   Lot: [id number]   NDC: [address]-1

## 2020-01-10 ENCOUNTER — Telehealth: Payer: Self-pay | Admitting: Internal Medicine

## 2020-01-10 NOTE — Telephone Encounter (Signed)
Sent to Dr. John. 

## 2020-01-10 NOTE — Telephone Encounter (Signed)
Pt is requesting a z-pack for bronchitis, patient routinely gets bronchitis and wants to head this off, she has had it a week already    [id number] - [place], [place] - [address] AT [facility] [address] & [facility] [address] Phone:  [phone]  Fax:  [phone]       Please call her at [phone]

## 2020-01-10 NOTE — Telephone Encounter (Signed)
Very sorry, as so many bronchitis are viral and antibiotics not needed, and we are trying to not overprescribe antibiotics, please consider an OV with a provider in the office, or UC where COVID testing can be done as well

## 2020-01-11 NOTE — Telephone Encounter (Signed)
Spoke with pt and was able to inform her of Dr. Raphael Gibney instructions. Pt states she understood and will be going to an UC for COVID testing.

## 2020-06-01 ENCOUNTER — Other Ambulatory Visit (HOSPITAL_BASED_OUTPATIENT_CLINIC_OR_DEPARTMENT_OTHER): Payer: Self-pay

## 2020-06-01 ENCOUNTER — Ambulatory Visit: Payer: No Typology Code available for payment source | Attending: Internal Medicine

## 2020-06-01 ENCOUNTER — Other Ambulatory Visit: Payer: Self-pay

## 2020-06-01 DIAGNOSIS — Z23 Encounter for immunization: Secondary | ICD-10-CM

## 2020-06-01 MED ORDER — PFIZER-BIONT COVID-19 VAC-TRIS 30 MCG/0.3ML IM SUSP
INTRAMUSCULAR | 0 refills | Status: DC
  Filled 2020-06-01: qty 0.3, 1d supply, fill #0

## 2020-06-01 NOTE — Progress Notes (Signed)
   Covid-19 Vaccination Clinic  Name:  Deanna Buck    MRN: [id number] DOB: 1945-01-27  06/01/2020  [name] was observed post Covid-19 immunization for 15 minutes without incident. She was provided with Vaccine Information Sheet and instruction to access the V-Safe system.   [name] was instructed to call 911 with any severe reactions post vaccine: Marland Kitchen Difficulty breathing  . Swelling of face and throat  . A fast heartbeat  . A bad rash all over body  . Dizziness and weakness   Immunizations Administered    Name Date Dose VIS Date Route   PFIZER Comrnaty(Gray TOP) Covid-19 Vaccine 06/01/2020  1:02 PM 0.3 mL 12/22/2019 Intramuscular   Manufacturer: [place]   Lot: [id number]   NDC: [phone]

## 2020-07-24 ENCOUNTER — Other Ambulatory Visit: Payer: Self-pay | Admitting: Internal Medicine

## 2020-07-24 DIAGNOSIS — E876 Hypokalemia: Secondary | ICD-10-CM

## 2020-07-24 MED ORDER — ATORVASTATIN CALCIUM 20 MG PO TABS: 20.0000 mg | ORAL_TABLET | Freq: Every day | ORAL | 0 refills | Status: DC

## 2020-07-24 MED ORDER — POTASSIUM CHLORIDE CRYS ER 10 MEQ PO TBCR
EXTENDED_RELEASE_TABLET | ORAL | 0 refills | Status: DC
Start: 2020-07-24 — End: 2020-09-12

## 2020-07-24 MED ORDER — NADOLOL 40 MG PO TABS: ORAL_TABLET | ORAL | 0 refills | Status: DC

## 2020-07-24 MED ORDER — HYDROCHLOROTHIAZIDE 25 MG PO TABS: 25.0000 mg | ORAL_TABLET | Freq: Every day | ORAL | 0 refills | Status: DC

## 2020-08-20 ENCOUNTER — Other Ambulatory Visit: Payer: Medicare Other

## 2020-08-20 ENCOUNTER — Other Ambulatory Visit: Payer: Self-pay

## 2020-08-20 DIAGNOSIS — R739 Hyperglycemia, unspecified: Secondary | ICD-10-CM | POA: Diagnosis not present

## 2020-08-20 DIAGNOSIS — Z0000 Encounter for general adult medical examination without abnormal findings: Secondary | ICD-10-CM | POA: Diagnosis not present

## 2020-08-20 LAB — COMPLETE METABOLIC PANEL WITH GFR
AG Ratio: 1.3 (calc) (ref 1.0–2.5)
ALT: 14 U/L (ref 6–29)
AST: 22 U/L (ref 10–35)
Albumin: 3.8 g/dL (ref 3.6–5.1)
Alkaline phosphatase (APISO): 132 U/L (ref 37–153)
BUN: 13 mg/dL (ref 7–25)
CO2: 27 mmol/L (ref 20–32)
Calcium: 10 mg/dL (ref 8.6–10.4)
Chloride: 106 mmol/L (ref 98–110)
Creat: 0.68 mg/dL (ref 0.60–1.00)
Globulin: 2.9 g/dL (calc) (ref 1.9–3.7)
Glucose, Bld: 115 mg/dL — ABNORMAL HIGH (ref 65–99)
Potassium: 3.5 mmol/L (ref 3.5–5.3)
Sodium: 142 mmol/L (ref 135–146)
Total Bilirubin: 1.1 mg/dL (ref 0.2–1.2)
Total Protein: 6.7 g/dL (ref 6.1–8.1)
eGFR: 91 mL/min/{1.73_m2} (ref >=60)

## 2020-08-21 LAB — URINALYSIS, ROUTINE W REFLEX MICROSCOPIC
Bacteria, UA: NONE SEEN /HPF
Bilirubin Urine: NEGATIVE
Glucose, UA: NEGATIVE
Hgb urine dipstick: NEGATIVE
Hyaline Cast: NONE SEEN /LPF
Ketones, ur: NEGATIVE
Nitrite: NEGATIVE
Protein, ur: NEGATIVE
Specific Gravity, Urine: 1.021 (ref 1.001–1.035)
pH: 6 (ref 5.0–8.0)

## 2020-08-21 LAB — CBC WITH DIFFERENTIAL/PLATELET
Absolute Monocytes: 694 cells/uL (ref 200–950)
Basophils Absolute: 50 cells/uL (ref 0–200)
Basophils Relative: 0.8 %
Eosinophils Absolute: 180 cells/uL (ref 15–500)
Eosinophils Relative: 2.9 %
HCT: 38.5 % (ref 35.0–45.0)
Hemoglobin: 12.9 g/dL (ref 11.7–15.5)
Lymphs Abs: 1246 cells/uL (ref 850–3900)
MCH: 30.4 pg (ref 27.0–33.0)
MCHC: 33.5 g/dL (ref 32.0–36.0)
MCV: 90.6 fL (ref 80.0–100.0)
MPV: 11.5 fL (ref 7.5–12.5)
Monocytes Relative: 11.2 %
Neutro Abs: 4030 cells/uL (ref 1500–7800)
Neutrophils Relative %: 65 %
Platelets: 196 10*3/uL (ref 140–400)
RBC: 4.25 10*6/uL (ref 3.80–5.10)
RDW: 12.4 % (ref 11.0–15.0)
Total Lymphocyte: 20.1 %
WBC: 6.2 10*3/uL (ref 3.8–10.8)

## 2020-08-21 LAB — TSH: TSH: 2.17 mIU/L (ref 0.40–4.50)

## 2020-08-21 LAB — HEMOGLOBIN A1C
Hgb A1c MFr Bld: 4.8 % of total Hgb (ref <5.7)
Mean Plasma Glucose: 91 mg/dL
eAG (mmol/L): 5 mmol/L

## 2020-08-21 LAB — MICROSCOPIC MESSAGE

## 2020-08-21 LAB — LIPID PANEL
Cholesterol: 150 mg/dL (ref <200)
HDL: 60 mg/dL (ref >=50)
LDL Cholesterol (Calc): 69 mg/dL (calc)
Non-HDL Cholesterol (Calc): 90 mg/dL (calc) (ref <130)
Total CHOL/HDL Ratio: 2.5 (calc) (ref <5.0)
Triglycerides: 133 mg/dL (ref <150)

## 2020-08-24 ENCOUNTER — Encounter: Payer: No Typology Code available for payment source | Admitting: Internal Medicine

## 2020-09-03 ENCOUNTER — Encounter: Payer: Medicare Other | Admitting: Internal Medicine

## 2020-09-12 ENCOUNTER — Ambulatory Visit (INDEPENDENT_AMBULATORY_CARE_PROVIDER_SITE_OTHER): Payer: Medicare Other | Admitting: Internal Medicine

## 2020-09-12 ENCOUNTER — Other Ambulatory Visit: Payer: Self-pay

## 2020-09-12 ENCOUNTER — Encounter: Payer: Self-pay | Admitting: Internal Medicine

## 2020-09-12 VITALS — BP 144/86 | HR 63 | Temp 98.5°F | Ht 64.0 in | Wt 166.8 lb

## 2020-09-12 DIAGNOSIS — E876 Hypokalemia: Secondary | ICD-10-CM | POA: Diagnosis not present

## 2020-09-12 DIAGNOSIS — Z0001 Encounter for general adult medical examination with abnormal findings: Secondary | ICD-10-CM | POA: Diagnosis not present

## 2020-09-12 DIAGNOSIS — E785 Hyperlipidemia, unspecified: Secondary | ICD-10-CM

## 2020-09-12 DIAGNOSIS — R739 Hyperglycemia, unspecified: Secondary | ICD-10-CM | POA: Diagnosis not present

## 2020-09-12 DIAGNOSIS — F411 Generalized anxiety disorder: Secondary | ICD-10-CM

## 2020-09-12 DIAGNOSIS — I10 Essential (primary) hypertension: Secondary | ICD-10-CM

## 2020-09-12 DIAGNOSIS — Z23 Encounter for immunization: Secondary | ICD-10-CM

## 2020-09-12 DIAGNOSIS — Z1211 Encounter for screening for malignant neoplasm of colon: Secondary | ICD-10-CM

## 2020-09-12 DIAGNOSIS — E559 Vitamin D deficiency, unspecified: Secondary | ICD-10-CM

## 2020-09-12 DIAGNOSIS — E538 Deficiency of other specified B group vitamins: Secondary | ICD-10-CM

## 2020-09-12 MED ORDER — NADOLOL 40 MG PO TABS: ORAL_TABLET | ORAL | 3 refills | Status: DC

## 2020-09-12 MED ORDER — POTASSIUM CHLORIDE CRYS ER 10 MEQ PO TBCR: EXTENDED_RELEASE_TABLET | ORAL | 3 refills | Status: DC

## 2020-09-12 MED ORDER — ATORVASTATIN CALCIUM 20 MG PO TABS: 20.0000 mg | ORAL_TABLET | Freq: Every day | ORAL | 3 refills | Status: DC

## 2020-09-12 MED ORDER — TRIAMCINOLONE ACETONIDE 0.1 % EX CREA: TOPICAL_CREAM | CUTANEOUS | 0 refills | Status: DC

## 2020-09-12 MED ORDER — HYDROCHLOROTHIAZIDE 25 MG PO TABS: 25.0000 mg | ORAL_TABLET | Freq: Every day | ORAL | 3 refills | Status: DC

## 2020-09-12 NOTE — Patient Instructions (Addendum)
You had the flu shot today  You will be contacted regarding the referral for: cologuard  Please continue all other medications as before, and refills have been done if requested.  Please have the pharmacy call with any other refills you may need.  Please continue your efforts at being more active, low cholesterol diet, and weight control.  You are otherwise up to date with prevention measures today.  Please keep your appointments with your specialists as you may have planned  Please make an Appointment to return for your 1 year visit, or sooner if needed, with Lab testing by Appointment as well, to be done about 3-5 days before at the [facility] (so this is for TWO appointments - please see the scheduling desk as you leave)   Due to the ongoing Covid 19 pandemic, our lab now requires an appointment for any labs done at our office.  If you need labs done and do not have an appointment, please call our office ahead of time to schedule before presenting to the lab for your testing.

## 2020-09-12 NOTE — Progress Notes (Signed)
Patient ID: Deanna Buck, female   DOB: 09/15/1945, 75 y.o.   MRN: [id number]         Chief Complaint:: wellness exam and htn, hld, low K       HPI:  Deanna Buck is a 75 y.o. female here for wellness exam; due for cologuard, ok for flu shot, o/w up to date with preventive referrals and immunizaitons                        Also Pt denies chest pain, increased sob or doe, wheezing, orthopnea, PND, increased LE swelling, palpitations, dizziness or syncope.   Pt denies polydipsia, polyuria, or new focal neuro s/s   Pt denies fever, wt loss, night sweats, loss of appetite, or other constitutional symptoms  No other new complaints  Needs f/u for low K.  Denies worsening reflux, abd pain, dysphagia, n/v, bowel change or blood.  BP at home has been < 140/90   Denies worsening depressive symptoms, suicidal ideation, or panic   Wt Readings from Last 3 Encounters:  09/12/20 166 lb 12.8 oz (75.7 kg)  08/19/19 163 lb (73.9 kg)  08/17/18 157 lb (71.2 kg)   BP Readings from Last 3 Encounters:  09/12/20 (!) 144/86  08/19/19 140/80  08/17/18 (!) 142/88   Immunization History  Administered Date(s) Administered   Fluad Quad(high Dose 65+) 10/11/2018, 11/26/2019, 09/12/2020   Influenza Split 12/11/2010, 11/18/2011   Influenza Whole 12/13/2008, 10/23/2009   Influenza, High Dose Seasonal PF 11/17/2014, 10/11/2015, 09/29/2016, 10/05/2017   Influenza,inj,Quad PF,6+ Mos 10/04/2012, 10/04/2013   Influenza-Unspecified 10/14/2011, 10/11/2015, 10/13/2017   MMR 02/14/1952   PFIZER Comirnaty(Gray Top)Covid-19 Tri-Sucrose Vaccine 06/01/2020   PFIZER(Purple Top)SARS-COV-2 Vaccination 02/26/2019, 03/20/2019, 12/17/2019   Pneumococcal Conjugate-13 08/03/2014   Pneumococcal Polysaccharide-23 03/17/2011   Td 12/13/2008   Tdap 02/14/2011   Varicella 02/14/1952   Health Maintenance Due  Topic Date Due   Fecal DNA (Cologuard)  08/18/2019      Past Medical History:  Diagnosis Date   Abnormal mammogram 05/2013    L breast   ANXIETY    Breast cancer ([facility])    left breast   ECZEMA    Full dentures    GERD    Hypertension    Pt states she takes Nadolol for Anxiety, not BP   OA (osteoarthritis)    Radiation 10/24/13-11/30/13   left chest wall and axilla   Wears contact lenses    also wears glasses   Past Surgical History:  Procedure Laterality Date   BREAST IMPLANT REMOVAL Left 10/24/2014   Procedure: REMOVAL LEFT BREAST IMPLANT, Excision of Nipple Areola Left Breast;  Surgeon: Brinda Thimmappa, MD;  Location: [facility];  Service: Plastics;  Laterality: Left;   BREAST LUMPECTOMY  1982   rt and lt benign   BREAST RECONSTRUCTION WITH PLACEMENT OF TISSUE EXPANDER AND FLEX HD (ACELLULAR HYDRATED DERMIS) Left 08/04/2013   Procedure: BREAST RECONSTRUCTION WITH PLACEMENT OF TISSUE EXPANDER AND  POSSIBLE FLEX HD (ACELLULAR HYDRATED DERMIS);  Surgeon: Brinda Thimmappa, MD;  Location: [facility];  Service: Plastics;  Laterality: Left;   INCISION AND DRAINAGE OF WOUND Left 08/30/2013   Procedure: DEBRIDEMENT OF LEFT BREAST;  Surgeon: Brinda Thimmappa, MD;  Location: [facility];  Service: Plastics;  Laterality: Left;   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, had a second breast biopsy (R) in 2007   LIPOSUCTION WITH LIPOFILLING Left 05/30/2014     Procedure: LIPOFILLING TO LEFT CHEST;  Surgeon: Brinda Thimmappa, MD;  Location: [facility];  Service: Plastics;  Laterality: Left;   MASS EXCISION Right 05/30/2014   Procedure: EXCISION OF RIGHT AXILLARY LIPOMA;  Surgeon: Brinda Thimmappa, MD;  Location: [facility];  Service: Plastics;  Laterality: Right;   MASTECTOMY Left    MASTOPEXY Right 05/30/2014   Procedure: RIGHT MASTOPEXY FOR SYMMETRY;  Surgeon: Brinda Thimmappa, MD;  Location: [facility];  Service: Plastics;  Laterality: Right;   REDUCTION MAMMAPLASTY Right 1980   REMOVAL OF TISSUE EXPANDER AND PLACEMENT  OF IMPLANT Left 05/30/2014   Procedure: REMOVAL OF LEFT TISSUE EXPANDER AND PLACEMENT OF IMPLANT;  Surgeon: Brinda Thimmappa, MD;  Location: [facility];  Service: Plastics;  Laterality: Left;    reports that she has never smoked. She has never used smokeless tobacco. She reports current alcohol use. She reports that she does not use drugs. family history includes Arthritis in an other family member; Breast cancer in her mother; Dementia in her mother; Hyperlipidemia in an other family member; Hypertension in an other family member; Kidney disease in an other family member. Allergies  Allergen Reactions   Morphine And Related Shortness Of Breath   Sulfonamide Derivatives Hives and Itching   Banana Other (See Comments)    Pain   Penicillins Other (See Comments)    Dr. Stanley Wilson told her she was allergic to PCN.   Sulfa Antibiotics Hives   Current Outpatient Medications on File Prior to Visit  Medication Sig Dispense Refill   Cholecalciferol (VITAMIN D) 2000 units tablet Take 2,000 Units by mouth daily.     non-metallic deodorant (ALRA) MISC Apply 1 application topically daily as needed.     vitamin B-12 (CYANOCOBALAMIN) 100 MCG tablet Take 50 mcg by mouth as needed.      No current facility-administered medications on file prior to visit.        ROS:  All others reviewed and negative.  Objective        PE:  BP (!) 144/86 (BP Location: Right Arm, Patient Position: Sitting, Cuff Size: Large)   Pulse 63   Temp 98.5 F (36.9 C) (Oral)   Ht 5' 4" (1.626 m)   Wt 166 lb 12.8 oz (75.7 kg)   SpO2 95%   BMI 28.63 kg/m                 Constitutional: Pt appears in NAD               HENT: Head: NCAT.                Right Ear: External ear normal.                 Left Ear: External ear normal.                Eyes: . Pupils are equal, round, and reactive to light. Conjunctivae and EOM are normal               Nose: without d/c or deformity               Neck: Neck  supple. Gross normal ROM               Cardiovascular: Normal rate and regular rhythm.                 Pulmonary/Chest: Effort normal and breath sounds without rales or wheezing.                  Abd:  Soft, NT, ND, + BS, no organomegaly               Neurological: Pt is alert. At baseline orientation, motor grossly intact               Skin: Skin is warm. No rashes, no other new lesions, LE edema - none               Psychiatric: Pt behavior is normal without agitation   Micro: none  Cardiac tracings I have personally interpreted today:  none  Pertinent Radiological findings (summarize): none   Lab Results  Component Value Date   WBC 6.2 08/20/2020   HGB 12.9 08/20/2020   HCT 38.5 08/20/2020   PLT 196 08/20/2020   GLUCOSE 115 (H) 08/20/2020   CHOL 150 08/20/2020   TRIG 133 08/20/2020   HDL 60 08/20/2020   LDLDIRECT 84.0 08/12/2018   LDLCALC 69 08/20/2020   ALT 14 08/20/2020   AST 22 08/20/2020   NA 142 08/20/2020   K 3.5 08/20/2020   CL 106 08/20/2020   CREATININE 0.68 08/20/2020   BUN 13 08/20/2020   CO2 27 08/20/2020   TSH 2.17 08/20/2020   HGBA1C 4.8 08/20/2020   Assessment/Plan:  Deanna Buck is a 75 y.o. White or Caucasian [1] female with  has a past medical history of Abnormal mammogram (05/2013), ANXIETY, Breast cancer ([facility]), ECZEMA, Full dentures, GERD, Hypertension, OA (osteoarthritis), Radiation (10/24/13-11/30/13), and Wears contact lenses.  Encounter for well adult exam with abnormal findings Age and sex appropriate education and counseling updated with regular exercise and diet Referrals for preventative services - for cologuard Immunizations addressed - for flu shot Smoking counseling  - none needed Evidence for depression or other mood disorder - none significant Most recent labs reviewed. I have personally reviewed and have noted: 1) the patient's medical and social history 2) The patient's current medications and supplements 3) The patient's height,  weight, and BMI have been recorded in the chart   Essential hypertension BP Readings from Last 3 Encounters:  09/12/20 (!) 144/86  08/19/19 140/80  08/17/18 (!) 142/88   Uncontrolled, pt to continue medical treatment hct and corgard, as declines change today, to f/u BP at home and next visit   Dyslipidemia Lab Results  Component Value Date   LDLCALC 69 08/20/2020   Stable, pt to continue current statin lipitor 20    Hypokalemia Also for f/u lab,  to f/u any worsening symptoms or concerns  Anxiety state Stable overall, declines need for change in tx,  to f/u any worsening symptoms or concerns  Followup: No follow-ups on file.  James John, MD 09/16/2020 6:12 PM [facility]  

## 2020-09-16 NOTE — Assessment & Plan Note (Signed)
BP Readings from Last 3 Encounters:  09/12/20 (!) 144/86  08/19/19 140/80  08/17/18 (!) 142/88   Uncontrolled, pt to continue medical treatment hct and corgard, as declines change today, to f/u BP at home and next visit

## 2020-09-16 NOTE — Assessment & Plan Note (Signed)
Stable overall, declines need for change in tx,  to f/u any worsening symptoms or concerns

## 2020-09-16 NOTE — Assessment & Plan Note (Signed)
Age and sex appropriate education and counseling updated with regular exercise and diet Referrals for preventative services - for cologuard Immunizations addressed - for flu shot Smoking counseling  - none needed Evidence for depression or other mood disorder - none significant Most recent labs reviewed. I have personally reviewed and have noted: 1) the patient's medical and social history 2) The patient's current medications and supplements 3) The patient's height, weight, and BMI have been recorded in the chart

## 2020-09-16 NOTE — Assessment & Plan Note (Signed)
Also for f/u lab,  to f/u any worsening symptoms or concerns 

## 2020-09-16 NOTE — Assessment & Plan Note (Signed)
Lab Results  Component Value Date   LDLCALC 69 08/20/2020   Stable, pt to continue current statin lipitor 20

## 2020-09-19 ENCOUNTER — Other Ambulatory Visit: Payer: Self-pay | Admitting: Internal Medicine

## 2020-09-26 DIAGNOSIS — Z1211 Encounter for screening for malignant neoplasm of colon: Secondary | ICD-10-CM | POA: Diagnosis not present

## 2020-10-03 ENCOUNTER — Encounter: Payer: Self-pay | Admitting: Internal Medicine

## 2020-10-03 LAB — COLOGUARD: Cologuard: NEGATIVE

## 2020-10-10 ENCOUNTER — Ambulatory Visit: Payer: Medicare Other | Attending: Internal Medicine

## 2020-10-10 ENCOUNTER — Other Ambulatory Visit (HOSPITAL_BASED_OUTPATIENT_CLINIC_OR_DEPARTMENT_OTHER): Payer: Self-pay

## 2020-10-10 DIAGNOSIS — Z23 Encounter for immunization: Secondary | ICD-10-CM

## 2020-10-10 MED ORDER — PFIZER COVID-19 VAC BIVALENT 30 MCG/0.3ML IM SUSP
INTRAMUSCULAR | 0 refills | Status: DC
  Filled 2020-10-10: qty 0.3, 1d supply, fill #0

## 2020-10-10 NOTE — Progress Notes (Signed)
   Covid-19 Vaccination Clinic  Name:  Deanna Buck    MRN: [id number] DOB: 1945-06-12  10/10/2020  [name] was observed post Covid-19 immunization for 15 minutes without incident. She was provided with Vaccine Information Sheet and instruction to access the V-Safe system.   [name] was instructed to call 911 with any severe reactions post vaccine: Difficulty breathing  Swelling of face and throat  A fast heartbeat  A bad rash all over body  Dizziness and weakness

## 2020-11-07 ENCOUNTER — Other Ambulatory Visit: Payer: Self-pay | Admitting: Internal Medicine

## 2020-11-07 DIAGNOSIS — Z1231 Encounter for screening mammogram for malignant neoplasm of breast: Secondary | ICD-10-CM

## 2020-12-12 ENCOUNTER — Other Ambulatory Visit: Payer: Self-pay

## 2020-12-12 ENCOUNTER — Ambulatory Visit
Admission: RE | Admit: 2020-12-12 | Discharge: 2020-12-12 | Disposition: A | Discharge status: Home / Self Care | Payer: Medicare Other | Source: Ambulatory Visit | Attending: Internal Medicine | Admitting: Internal Medicine

## 2020-12-12 DIAGNOSIS — Z1231 Encounter for screening mammogram for malignant neoplasm of breast: Secondary | ICD-10-CM

## 2020-12-13 ENCOUNTER — Other Ambulatory Visit: Payer: Self-pay | Admitting: Internal Medicine

## 2020-12-13 DIAGNOSIS — R928 Other abnormal and inconclusive findings on diagnostic imaging of breast: Secondary | ICD-10-CM

## 2021-01-15 ENCOUNTER — Ambulatory Visit
Admission: RE | Admit: 2021-01-15 | Discharge: 2021-01-15 | Disposition: A | Discharge status: Home / Self Care | Payer: Medicare Other | Source: Ambulatory Visit | Attending: Internal Medicine | Admitting: Internal Medicine

## 2021-01-15 DIAGNOSIS — R928 Other abnormal and inconclusive findings on diagnostic imaging of breast: Secondary | ICD-10-CM

## 2021-01-15 DIAGNOSIS — R922 Inconclusive mammogram: Secondary | ICD-10-CM | POA: Diagnosis not present

## 2021-08-13 ENCOUNTER — Ambulatory Visit (INDEPENDENT_AMBULATORY_CARE_PROVIDER_SITE_OTHER): Payer: Medicare Other | Admitting: Internal Medicine

## 2021-08-13 ENCOUNTER — Encounter: Payer: Self-pay | Admitting: Internal Medicine

## 2021-08-13 VITALS — BP 134/72 | HR 59 | Temp 98.4°F | Ht 64.0 in | Wt 165.0 lb

## 2021-08-13 DIAGNOSIS — I10 Essential (primary) hypertension: Secondary | ICD-10-CM

## 2021-08-13 DIAGNOSIS — F411 Generalized anxiety disorder: Secondary | ICD-10-CM | POA: Diagnosis not present

## 2021-08-13 DIAGNOSIS — R051 Acute cough: Secondary | ICD-10-CM

## 2021-08-13 MED ORDER — HYDROCODONE BIT-HOMATROP MBR 5-1.5 MG/5ML PO SOLN: 5.0000 mL | Freq: Four times a day (QID) | ORAL | 0 refills | Status: AC | PRN

## 2021-08-13 MED ORDER — DOXYCYCLINE HYCLATE 100 MG PO TABS: 100.0000 mg | ORAL_TABLET | Freq: Two times a day (BID) | ORAL | 0 refills | Status: DC

## 2021-08-13 NOTE — Patient Instructions (Addendum)
Your COVID testing was done today - negative  Please take all new medication as prescribed - the antibiotic, and cough medicine  Please continue all other medications as before, and refills have been done if requested.  Please have the pharmacy call with any other refills you may need.  Please keep your appointments with your specialists as you may have planned

## 2021-08-13 NOTE — Progress Notes (Unsigned)
Patient ID: Deanna Buck, female   DOB: 08/11/1945, 76 y.o.   MRN: [id number]        Chief Complaint: follow up with acute cough, htn, anxiety       HPI:  Deanna Buck is a 76 y.o. female Here with acute onset mild to mod 2-3 days ST, HA, general weakness and malaise, with prod cough greenish sputum, but Pt denies chest pain, increased sob or doe, wheezing, orthopnea, PND, increased LE swelling, palpitations, dizziness or syncope.   Pt denies polydipsia, polyuria, or new focal neuro s/s.    Pt denies wt loss, night sweats, loss of appetite, or other constitutional symptoms  Denies worsening depressive symptoms, suicidal ideation, or panic Wt Readings from Last 3 Encounters:  08/13/21 165 lb (74.8 kg)  09/12/20 166 lb 12.8 oz (75.7 kg)  08/19/19 163 lb (73.9 kg)   BP Readings from Last 3 Encounters:  08/13/21 134/72  09/12/20 (!) 144/86  08/19/19 140/80         Past Medical History:  Diagnosis Date   Abnormal mammogram 05/2013   L breast   ANXIETY    Breast cancer ([place])    left breast   ECZEMA    Full dentures    GERD    Hypertension    Pt states she takes Nadolol for Anxiety, not BP   OA (osteoarthritis)    Radiation 10/24/13-11/30/13   left chest wall and axilla   Wears contact lenses    also wears glasses   Past Surgical History:  Procedure Laterality Date   BREAST IMPLANT REMOVAL Left 10/24/2014   Procedure: REMOVAL LEFT BREAST IMPLANT, Excision of Nipple Areola Left Breast;  Surgeon: [name], MD;  Location: [facility];  Service: Plastics;  Laterality: Left;   BREAST LUMPECTOMY  1982   rt and lt benign   BREAST RECONSTRUCTION WITH PLACEMENT OF TISSUE EXPANDER AND FLEX HD (ACELLULAR HYDRATED DERMIS) Left 08/04/2013   Procedure: BREAST RECONSTRUCTION WITH PLACEMENT OF TISSUE EXPANDER AND  POSSIBLE FLEX HD (ACELLULAR HYDRATED DERMIS);  Surgeon: [name], MD;  Location: [facility];  Service: Plastics;  Laterality: Left;    INCISION AND DRAINAGE OF WOUND Left 08/30/2013   Procedure: DEBRIDEMENT OF LEFT BREAST;  Surgeon: [name], MD;  Location: [facility];  Service: Plastics;  Laterality: Left;   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, had a second breast biopsy (R) in 2007   LIPOSUCTION WITH LIPOFILLING Left 05/30/2014   Procedure: LIPOFILLING TO LEFT CHEST;  Surgeon: [name], MD;  Location: [facility];  Service: Plastics;  Laterality: Left;   MASS EXCISION Right 05/30/2014   Procedure: EXCISION OF RIGHT AXILLARY LIPOMA;  Surgeon: [name], MD;  Location: [facility];  Service: Plastics;  Laterality: Right;   MASTECTOMY Left    MASTOPEXY Right 05/30/2014   Procedure: RIGHT MASTOPEXY FOR SYMMETRY;  Surgeon: [name], MD;  Location: [facility];  Service: Plastics;  Laterality: Right;   REDUCTION MAMMAPLASTY Right 1980   REMOVAL OF TISSUE EXPANDER AND PLACEMENT OF IMPLANT Left 05/30/2014   Procedure: REMOVAL OF LEFT TISSUE EXPANDER AND PLACEMENT OF IMPLANT;  Surgeon: [name], MD;  Location: [facility];  Service: Plastics;  Laterality: Left;    reports that she has never smoked. She has never used smokeless tobacco. She reports current alcohol use. She reports that she does not use drugs. family history includes Arthritis in  an other family member; Breast cancer in her mother; Dementia in her mother; Hyperlipidemia in an other family member; Hypertension in an other family member; Kidney disease in an other family member. Allergies  Allergen Reactions   Morphine And Related Shortness Of Breath   Sulfonamide Derivatives Hives and Itching   Banana Other (See Comments)    Pain   Penicillins Other (See Comments)    Dr. Sherolyn Buba told her she was allergic to PCN.   Sulfa Antibiotics Hives   Current Outpatient Medications on File Prior to Visit  Medication Sig Dispense Refill    atorvastatin (LIPITOR) 20 MG tablet Take 1 tablet (20 mg total) by mouth daily. 90 tablet 3   Cholecalciferol (VITAMIN D) 2000 units tablet Take 2,000 Units by mouth daily.     hydrochlorothiazide (HYDRODIURIL) 25 MG tablet Take 1 tablet (25 mg total) by mouth daily. 90 tablet 3   nadolol (CORGARD) 40 MG tablet TAKE 1/2 TABLET (20 MG     TOTAL) DAILY. 45 tablet 3   non-metallic deodorant (ALRA) MISC Apply 1 application topically daily as needed.     potassium chloride (KLOR-CON M10) 10 MEQ tablet TAKE 2 TABLETS (=20MEQ)    DAILY 180 tablet 3   triamcinolone cream (KENALOG) 0.1 % APPLY TWICE A DAY 60 g 2   vitamin B-12 (CYANOCOBALAMIN) 100 MCG tablet Take 50 mcg by mouth as needed.      No current facility-administered medications on file prior to visit.        ROS:  All others reviewed and negative.  Objective        PE:  BP 134/72 (BP Location: Right Arm, Patient Position: Sitting, Cuff Size: Large)   Pulse (!) 59   Temp 98.4 F (36.9 C) (Oral)   Ht '5\' 4"'$  (1.626 m)   Wt 165 lb (74.8 kg)   SpO2 98%   BMI 28.32 kg/m                 Constitutional: Pt appears in NAD               HENT: Head: NCAT.                Right Ear: External ear normal.                 Left Ear: External ear normal.                Eyes: . Pupils are equal, round, and reactive to light. Conjunctivae and EOM are normal               Nose: without d/c or deformity               Neck: Neck supple. Gross normal ROM               Cardiovascular: Normal rate and regular rhythm.                 Pulmonary/Chest: Effort normal and breath sounds without rales or wheezing.                Abd:  Soft, NT, ND, + BS, no organomegaly               Neurological: Pt is alert. At baseline orientation, motor grossly intact               Skin: Skin is warm. No rashes, no other new lesions, LE edema - none  Psychiatric: Pt behavior is normal without agitation , mild nervous  Micro: none  Cardiac tracings I have  personally interpreted today:  none  Pertinent Radiological findings (summarize): none  COVID - negative - POCT today  Lab Results  Component Value Date   WBC 6.2 08/20/2020   HGB 12.9 08/20/2020   HCT 38.5 08/20/2020   PLT 196 08/20/2020   GLUCOSE 115 (H) 08/20/2020   CHOL 150 08/20/2020   TRIG 133 08/20/2020   HDL 60 08/20/2020   LDLDIRECT 84.0 08/12/2018   LDLCALC 69 08/20/2020   ALT 14 08/20/2020   AST 22 08/20/2020   NA 142 08/20/2020   K 3.5 08/20/2020   CL 106 08/20/2020   CREATININE 0.68 08/20/2020   BUN 13 08/20/2020   CO2 27 08/20/2020   TSH 2.17 08/20/2020   HGBA1C 4.8 08/20/2020   Assessment/Plan:  DAJA SHUPING is a 76 y.o. White or Caucasian [1] female with  has a past medical history of Abnormal mammogram (05/2013), ANXIETY, Breast cancer ([place]), ECZEMA, Full dentures, GERD, Hypertension, OA (osteoarthritis), Radiation (10/24/13-11/30/13), and Wears contact lenses.  Cough Mild to mod, c/w bronchitis vs pna, declines cxr, covid neg,  for antibx course, cough med prn, to f/u any worsening symptoms or concerns   Essential hypertension BP Readings from Last 3 Encounters:  08/13/21 134/72  09/12/20 (!) 144/86  08/19/19 140/80   Stable, pt to continue medical treatment corgard 20 qd, hct 25 qd   Anxiety state Overall stable, cont current med tx - reassurance  Followup: Return if symptoms worsen or fail to improve.  Cathlean Cower, MD 08/14/2021 1:41 PM [facility]

## 2021-08-14 ENCOUNTER — Encounter: Payer: Self-pay | Admitting: Internal Medicine

## 2021-08-14 DIAGNOSIS — R059 Cough, unspecified: Secondary | ICD-10-CM | POA: Insufficient documentation

## 2021-08-14 NOTE — Assessment & Plan Note (Signed)
BP Readings from Last 3 Encounters:  08/13/21 134/72  09/12/20 (!) 144/86  08/19/19 140/80   Stable, pt to continue medical treatment corgard 20 qd, hct 25 qd

## 2021-08-14 NOTE — Assessment & Plan Note (Signed)
Overall stable, cont current med tx - reassurance

## 2021-08-14 NOTE — Assessment & Plan Note (Signed)
Mild to mod, c/w bronchitis vs pna, declines cxr, covid neg,  for antibx course, cough med prn, to f/u any worsening symptoms or concerns

## 2021-08-19 ENCOUNTER — Telehealth: Payer: Self-pay | Admitting: Internal Medicine

## 2021-08-19 MED ORDER — NIRMATRELVIR/RITONAVIR (PAXLOVID)TABLET: 3.0000 | ORAL_TABLET | Freq: Two times a day (BID) | ORAL | 0 refills | Status: AC

## 2021-08-19 NOTE — Telephone Encounter (Signed)
Ok and to let pt know - I have send paxlovid rx to pharmacy

## 2021-08-19 NOTE — Telephone Encounter (Signed)
Pt states she saw Dr.John on 08/13/21. She was tested for COVID during that visit and test came back negative. Pt tested herself at home today(08/19/21) and the test came back positive and she just wanted to let Dr. Jenny Reichmann know. She said as of right now she isn't feeling any pain, she is mildly congested and says she doesn't think she smell or taste that well.   Pt also wanted to let Dr. Jenny Reichmann know that she has stopped taking doxycycline. Pt stated the rx caused hallucinations. Rx also made pt sick to her stomach and made her itch.    Fyi

## 2021-08-19 NOTE — Addendum Note (Signed)
Addended by: Biagio Borg on: 08/19/2021 09:20 PM   Modules accepted: Orders

## 2021-09-10 ENCOUNTER — Other Ambulatory Visit (INDEPENDENT_AMBULATORY_CARE_PROVIDER_SITE_OTHER): Payer: Medicare Other

## 2021-09-10 DIAGNOSIS — E538 Deficiency of other specified B group vitamins: Secondary | ICD-10-CM

## 2021-09-10 DIAGNOSIS — E559 Vitamin D deficiency, unspecified: Secondary | ICD-10-CM

## 2021-09-10 DIAGNOSIS — R739 Hyperglycemia, unspecified: Secondary | ICD-10-CM | POA: Diagnosis not present

## 2021-09-10 DIAGNOSIS — I10 Essential (primary) hypertension: Secondary | ICD-10-CM | POA: Diagnosis not present

## 2021-09-10 LAB — URINALYSIS, ROUTINE W REFLEX MICROSCOPIC
Bilirubin Urine: NEGATIVE
Hgb urine dipstick: NEGATIVE
Leukocytes,Ua: NEGATIVE
Nitrite: NEGATIVE
Specific Gravity, Urine: 1.025 (ref 1.000–1.030)
Urine Glucose: NEGATIVE
Urobilinogen, UA: 1 (ref 0.0–1.0)
pH: 6 (ref 5.0–8.0)

## 2021-09-10 LAB — CBC WITH DIFFERENTIAL/PLATELET
Basophils Absolute: 0.1 10*3/uL (ref 0.0–0.1)
Basophils Relative: 1.1 % (ref 0.0–3.0)
Eosinophils Absolute: 0.2 10*3/uL (ref 0.0–0.7)
Eosinophils Relative: 2.9 % (ref 0.0–5.0)
HCT: 35.1 % — ABNORMAL LOW (ref 36.0–46.0)
Hemoglobin: 12.3 g/dL (ref 12.0–15.0)
Lymphocytes Relative: 18.1 % (ref 12.0–46.0)
Lymphs Abs: 1.1 10*3/uL (ref 0.7–4.0)
MCHC: 34.9 g/dL (ref 30.0–36.0)
MCV: 88.6 fl (ref 78.0–100.0)
Monocytes Absolute: 1 10*3/uL (ref 0.1–1.0)
Monocytes Relative: 16.6 % — ABNORMAL HIGH (ref 3.0–12.0)
Neutro Abs: 3.9 10*3/uL (ref 1.4–7.7)
Neutrophils Relative %: 61.3 % (ref 43.0–77.0)
Platelets: 161 10*3/uL (ref 150.0–400.0)
RBC: 3.97 Mil/uL (ref 3.87–5.11)
RDW: 13.2 % (ref 11.5–15.5)
WBC: 6.3 10*3/uL (ref 4.0–10.5)

## 2021-09-10 LAB — LIPID PANEL
Cholesterol: 129 mg/dL (ref 0–200)
HDL: 51.6 mg/dL (ref >39.00)
LDL Cholesterol: 58 mg/dL (ref 0–99)
NonHDL: 77.02
Total CHOL/HDL Ratio: 2
Triglycerides: 93 mg/dL (ref 0.0–149.0)
VLDL: 18.6 mg/dL (ref 0.0–40.0)

## 2021-09-10 LAB — BASIC METABOLIC PANEL
BUN: 13 mg/dL (ref 6–23)
CO2: 28 mEq/L (ref 19–32)
Calcium: 9.7 mg/dL (ref 8.4–10.5)
Chloride: 107 mEq/L (ref 96–112)
Creatinine, Ser: 0.63 mg/dL (ref 0.40–1.20)
GFR: 86.72 mL/min (ref >60.00)
Glucose, Bld: 86 mg/dL (ref 70–99)
Potassium: 3.5 mEq/L (ref 3.5–5.1)
Sodium: 142 mEq/L (ref 135–145)

## 2021-09-10 LAB — HEMOGLOBIN A1C: Hgb A1c MFr Bld: 5.3 % (ref 4.6–6.5)

## 2021-09-10 LAB — HEPATIC FUNCTION PANEL
ALT: 14 U/L (ref 0–35)
AST: 19 U/L (ref 0–37)
Albumin: 3.6 g/dL (ref 3.5–5.2)
Alkaline Phosphatase: 139 U/L — ABNORMAL HIGH (ref 39–117)
Bilirubin, Direct: 0.3 mg/dL (ref 0.0–0.3)
Total Bilirubin: 1.3 mg/dL — ABNORMAL HIGH (ref 0.2–1.2)
Total Protein: 6.7 g/dL (ref 6.0–8.3)

## 2021-09-10 LAB — VITAMIN B12: Vitamin B-12: 531 pg/mL (ref 211–911)

## 2021-09-10 LAB — VITAMIN D 25 HYDROXY (VIT D DEFICIENCY, FRACTURES): VITD: 45.12 ng/mL (ref 30.00–100.00)

## 2021-09-10 LAB — TSH: TSH: 1.37 u[IU]/mL (ref 0.35–5.50)

## 2021-09-13 ENCOUNTER — Encounter: Payer: Medicare Other | Admitting: Internal Medicine

## 2021-09-15 ENCOUNTER — Other Ambulatory Visit: Payer: Self-pay | Admitting: Internal Medicine

## 2021-09-15 NOTE — Telephone Encounter (Signed)
Please refill as per office routine med refill policy (all routine meds to be refilled for 3 mo or monthly (per pt preference) up to one year from last visit, then month to month grace period for 3 mo, then further med refills will have to be denied) ? ?

## 2021-09-23 ENCOUNTER — Ambulatory Visit (INDEPENDENT_AMBULATORY_CARE_PROVIDER_SITE_OTHER): Payer: Medicare Other | Admitting: Internal Medicine

## 2021-09-23 VITALS — BP 136/80 | HR 62 | Temp 98.7°F | Ht 64.0 in | Wt 162.0 lb

## 2021-09-23 DIAGNOSIS — Z0001 Encounter for general adult medical examination with abnormal findings: Secondary | ICD-10-CM

## 2021-09-23 DIAGNOSIS — R739 Hyperglycemia, unspecified: Secondary | ICD-10-CM | POA: Diagnosis not present

## 2021-09-23 DIAGNOSIS — I10 Essential (primary) hypertension: Secondary | ICD-10-CM | POA: Diagnosis not present

## 2021-09-23 DIAGNOSIS — Z23 Encounter for immunization: Secondary | ICD-10-CM

## 2021-09-23 DIAGNOSIS — E876 Hypokalemia: Secondary | ICD-10-CM

## 2021-09-23 DIAGNOSIS — E785 Hyperlipidemia, unspecified: Secondary | ICD-10-CM | POA: Diagnosis not present

## 2021-09-23 DIAGNOSIS — E559 Vitamin D deficiency, unspecified: Secondary | ICD-10-CM

## 2021-09-23 DIAGNOSIS — E538 Deficiency of other specified B group vitamins: Secondary | ICD-10-CM | POA: Diagnosis not present

## 2021-09-23 DIAGNOSIS — U099 Post covid-19 condition, unspecified: Secondary | ICD-10-CM | POA: Diagnosis not present

## 2021-09-23 MED ORDER — NADOLOL 40 MG PO TABS: ORAL_TABLET | ORAL | 3 refills | Status: DC

## 2021-09-23 MED ORDER — HYDROCHLOROTHIAZIDE 25 MG PO TABS: 25.0000 mg | ORAL_TABLET | Freq: Every day | ORAL | 3 refills | Status: DC

## 2021-09-23 MED ORDER — ATORVASTATIN CALCIUM 20 MG PO TABS: 20.0000 mg | ORAL_TABLET | Freq: Every day | ORAL | 3 refills | Status: DC

## 2021-09-23 MED ORDER — POTASSIUM CHLORIDE CRYS ER 10 MEQ PO TBCR: EXTENDED_RELEASE_TABLET | ORAL | 3 refills | Status: DC

## 2021-09-23 NOTE — Patient Instructions (Signed)

## 2021-09-23 NOTE — Progress Notes (Unsigned)
Patient ID: Deanna Buck, female   DOB: 04/18/1945, 76 y.o.   MRN: [id number]         Chief Complaint:: wellness exam and Physical (Discuss long covid and exhaustion )  ***       HPI:  Deanna Buck is a 76 y.o. female here for wellness exam                        Also s/p covid infection aug 2023 with persistent vomiting, lost 11 lbs but regaining.      Wt Readings from Last 3 Encounters:  09/23/21 162 lb (73.5 kg)  08/13/21 165 lb (74.8 kg)  09/12/20 166 lb 12.8 oz (75.7 kg)   BP Readings from Last 3 Encounters:  09/23/21 136/80  08/13/21 134/72  09/12/20 (!) 144/86   Immunization History  Administered Date(s) Administered   Fluad Quad(high Dose 65+) 10/11/2018, 11/26/2019, 09/12/2020   Influenza Split 12/11/2010, 11/18/2011   Influenza Whole 12/13/2008, 10/23/2009   Influenza, High Dose Seasonal PF 11/17/2014, 10/11/2015, 09/29/2016, 10/05/2017   Influenza,inj,Quad PF,6+ Mos 10/04/2012, 10/04/2013   Influenza-Unspecified 10/14/2011, 10/11/2015, 10/13/2017   MMR 02/14/1951   PFIZER Comirnaty(Gray Top)Covid-19 Tri-Sucrose Vaccine 06/01/2020   PFIZER(Purple Top)SARS-COV-2 Vaccination 02/26/2019, 03/20/2019, 12/17/2019   Pfizer Covid-19 Vaccine Bivalent Booster 64yrs & up 10/10/2020   Pneumococcal Conjugate-13 08/03/2014   Pneumococcal Polysaccharide-23 03/17/2011   Td 12/13/2008   Tdap 02/14/2011   Varicella 02/14/1951   Health Maintenance Due  Topic Date Due   Zoster Vaccines- Shingrix (1 of 2) Never done   COVID-19 Vaccine (6 - Pfizer risk series) 12/05/2020   TETANUS/TDAP  02/13/2021   INFLUENZA VACCINE  08/13/2021      Past Medical History:  Diagnosis Date   Abnormal mammogram 05/2013   L breast   ANXIETY    Breast cancer ([place])    left breast   ECZEMA    Full dentures    GERD    Hypertension    Pt states she takes Nadolol for Anxiety, not BP   OA (osteoarthritis)    Radiation 10/24/13-11/30/13   left chest wall and axilla   Wears contact lenses     also wears glasses   Past Surgical History:  Procedure Laterality Date   BREAST IMPLANT REMOVAL Left 10/24/2014   Procedure: REMOVAL LEFT BREAST IMPLANT, Excision of Nipple Areola Left Breast;  Surgeon: [name], MD;  Location: [facility];  Service: Plastics;  Laterality: Left;   BREAST LUMPECTOMY  1982   rt and lt benign   BREAST RECONSTRUCTION WITH PLACEMENT OF TISSUE EXPANDER AND FLEX HD (ACELLULAR HYDRATED DERMIS) Left 08/04/2013   Procedure: BREAST RECONSTRUCTION WITH PLACEMENT OF TISSUE EXPANDER AND  POSSIBLE FLEX HD (ACELLULAR HYDRATED DERMIS);  Surgeon: [name], MD;  Location: [facility];  Service: Plastics;  Laterality: Left;   INCISION AND DRAINAGE OF WOUND Left 08/30/2013   Procedure: DEBRIDEMENT OF LEFT BREAST;  Surgeon: [name], MD;  Location: [facility];  Service: Plastics;  Laterality: Left;   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, had a second breast biopsy (R) in 2007   LIPOSUCTION WITH LIPOFILLING Left 05/30/2014   Procedure: LIPOFILLING TO LEFT CHEST;  Surgeon: [name], MD;  Location: [facility];  Service: Plastics;  Laterality: Left;   MASS EXCISION Right 05/30/2014   Procedure: EXCISION OF RIGHT AXILLARY LIPOMA;  Surgeon: [name], MD;  Location: [facility];  Service: Clinical cytogeneticist;  Laterality: Right;   MASTECTOMY Left    MASTOPEXY Right 05/30/2014   Procedure: RIGHT MASTOPEXY FOR SYMMETRY;  Surgeon: [name], MD;  Location: [facility];  Service: Plastics;  Laterality: Right;   REDUCTION MAMMAPLASTY Right 1980   REMOVAL OF TISSUE EXPANDER AND PLACEMENT OF IMPLANT Left 05/30/2014   Procedure: REMOVAL OF LEFT TISSUE EXPANDER AND PLACEMENT OF IMPLANT;  Surgeon: [name], MD;  Location: [facility];  Service: Plastics;  Laterality: Left;    reports that she has never smoked. She has never used smokeless  tobacco. She reports current alcohol use. She reports that she does not use drugs. family history includes Arthritis in an other family member; Breast cancer in her mother; Dementia in her mother; Hyperlipidemia in an other family member; Hypertension in an other family member; Kidney disease in an other family member. Allergies  Allergen Reactions   Morphine And Related Shortness Of Breath   Sulfonamide Derivatives Hives and Itching   Banana Other (See Comments)    Pain   Doxycycline Itching    And nausea   Penicillins Other (See Comments)    Dr. Sherolyn Buba told her she was allergic to PCN.   Sulfa Antibiotics Hives   Current Outpatient Medications on File Prior to Visit  Medication Sig Dispense Refill   Cholecalciferol (VITAMIN D) 2000 units tablet Take 2,000 Units by mouth daily.     hydrochlorothiazide (HYDRODIURIL) 25 MG tablet Take 1 tablet (25 mg total) by mouth daily. 90 tablet 3   nadolol (CORGARD) 40 MG tablet TAKE 1/2 TABLET DAILY. 45 tablet 3   non-metallic deodorant (ALRA) MISC Apply 1 application topically daily as needed.     potassium chloride (KLOR-CON M10) 10 MEQ tablet TAKE 2 TABLETS (=20MEQ)    DAILY 180 tablet 3   triamcinolone cream (KENALOG) 0.1 % APPLY TWICE A DAY 60 g 2   vitamin B-12 (CYANOCOBALAMIN) 100 MCG tablet Take 50 mcg by mouth as needed.      atorvastatin (LIPITOR) 20 MG tablet Take 1 tablet (20 mg total) by mouth daily. 90 tablet 3   No current facility-administered medications on file prior to visit.        ROS:  All others reviewed and negative.  Objective        PE:  BP 136/80 (BP Location: Right Arm, Patient Position: Sitting, Cuff Size: Large)   Pulse 62   Temp 98.7 F (37.1 C) (Oral)   Ht $R'5\' 4"'pD$  (1.626 m)   Wt 162 lb (73.5 kg)   SpO2 93%   BMI 27.81 kg/m                 Constitutional: Pt appears in NAD               HENT: Head: NCAT.                Right Ear: External ear normal.                 Left Ear: External ear normal.                 Eyes: . Pupils are equal, round, and reactive to light. Conjunctivae and EOM are normal               Nose: without d/c or deformity               Neck: Neck supple. Gross normal ROM  Cardiovascular: Normal rate and regular rhythm.                 Pulmonary/Chest: Effort normal and breath sounds without rales or wheezing.                Abd:  Soft, NT, ND, + BS, no organomegaly               Neurological: Pt is alert. At baseline orientation, motor grossly intact               Skin: Skin is warm. No rashes, no other new lesions, LE edema - ***               Psychiatric: Pt behavior is normal without agitation   Micro: none  Cardiac tracings I have personally interpreted today:  none  Pertinent Radiological findings (summarize): none   Lab Results  Component Value Date   WBC 6.3 09/10/2021   HGB 12.3 09/10/2021   HCT 35.1 (L) 09/10/2021   PLT 161.0 09/10/2021   GLUCOSE 86 09/10/2021   CHOL 129 09/10/2021   TRIG 93.0 09/10/2021   HDL 51.60 09/10/2021   LDLDIRECT 84.0 08/12/2018   LDLCALC 58 09/10/2021   ALT 14 09/10/2021   AST 19 09/10/2021   NA 142 09/10/2021   K 3.5 09/10/2021   CL 107 09/10/2021   CREATININE 0.63 09/10/2021   BUN 13 09/10/2021   CO2 28 09/10/2021   TSH 1.37 09/10/2021   HGBA1C 5.3 09/10/2021   Assessment/Plan:  CECILIA VANCLEVE is a 76 y.o. White or Caucasian [1] female with  has a past medical history of Abnormal mammogram (05/2013), ANXIETY, Breast cancer ([place]), ECZEMA, Full dentures, GERD, Hypertension, OA (osteoarthritis), Radiation (10/24/13-11/30/13), and Wears contact lenses.  No problem-specific Assessment & Plan notes found for this encounter.  Followup: No follow-ups on file.  Cathlean Cower, MD 09/23/2021 11:41 AM [facility]

## 2021-09-24 ENCOUNTER — Encounter: Payer: Self-pay | Admitting: Internal Medicine

## 2021-09-24 DIAGNOSIS — U099 Post covid-19 condition, unspecified: Secondary | ICD-10-CM | POA: Insufficient documentation

## 2021-09-24 NOTE — Assessment & Plan Note (Signed)
With persistent fatigue post covid, but less than 6 mo, o/w asympt - d/w pt typical fatigue is 6 wks, then resolves, ok to follow for now

## 2021-09-24 NOTE — Assessment & Plan Note (Signed)
BP Readings from Last 3 Encounters:  09/23/21 136/80  08/13/21 134/72  09/12/20 (!) 144/86   Stable, pt to continue medical treatment hct 25 mg qd, corgard 20 qd

## 2021-09-24 NOTE — Assessment & Plan Note (Signed)
Lab Results  Component Value Date   LDLCALC 58 09/10/2021   Stable, pt to continue current statin liptior 20 mg qd

## 2021-09-24 NOTE — Assessment & Plan Note (Signed)
Lab Results  Component Value Date   K 3.5 09/10/2021  \ Low normal, but resolved,  to f/u any worsening symptoms or concerns

## 2021-09-24 NOTE — Assessment & Plan Note (Signed)
Age and sex appropriate education and counseling updated with regular exercise and diet Referrals for preventative services - none needed Immunizations addressed - for flu shot Smoking counseling  - none needed Evidence for depression or other mood disorder - none significant Most recent labs reviewed. I have personally reviewed and have noted: 1) the patient's medical and social history 2) The patient's current medications and supplements 3) The patient's height, weight, and BMI have been recorded in the chart

## 2021-09-24 NOTE — Addendum Note (Signed)
Addended by: Biagio Borg on: 09/24/2021 09:10 PM   Modules accepted: Orders

## 2021-09-26 DIAGNOSIS — H25813 Combined forms of age-related cataract, bilateral: Secondary | ICD-10-CM | POA: Diagnosis not present

## 2021-09-26 DIAGNOSIS — H5203 Hypermetropia, bilateral: Secondary | ICD-10-CM | POA: Diagnosis not present

## 2021-09-26 DIAGNOSIS — H52223 Regular astigmatism, bilateral: Secondary | ICD-10-CM | POA: Diagnosis not present

## 2021-09-26 DIAGNOSIS — H524 Presbyopia: Secondary | ICD-10-CM | POA: Diagnosis not present

## 2021-10-31 ENCOUNTER — Other Ambulatory Visit (HOSPITAL_BASED_OUTPATIENT_CLINIC_OR_DEPARTMENT_OTHER): Payer: Self-pay

## 2021-10-31 MED ORDER — COMIRNATY 30 MCG/0.3ML IM SUSY
PREFILLED_SYRINGE | INTRAMUSCULAR | 0 refills | Status: AC
  Filled 2021-10-31: qty 0.3, 1d supply, fill #0

## 2021-12-13 ENCOUNTER — Telehealth: Payer: Self-pay | Admitting: Internal Medicine

## 2021-12-13 NOTE — Telephone Encounter (Signed)
Left message for patient to call back to schedule Medicare Annual Wellness Visit     Please schedule at anytime with [facility] if patient calls the office back.      Any questions, please call me at [phone]

## 2022-06-03 ENCOUNTER — Other Ambulatory Visit (HOSPITAL_BASED_OUTPATIENT_CLINIC_OR_DEPARTMENT_OTHER): Payer: Self-pay

## 2022-06-03 MED ORDER — SHINGRIX 50 MCG/0.5ML IM SUSR
0.5000 mL | INTRAMUSCULAR | 0 refills | Status: AC
  Filled 2022-06-03: qty 0.5, 1d supply, fill #0

## 2022-08-25 ENCOUNTER — Ambulatory Visit (INDEPENDENT_AMBULATORY_CARE_PROVIDER_SITE_OTHER): Payer: Medicare Other

## 2022-08-25 VITALS — BP 148/72 | Ht 63.75 in | Wt 162.6 lb

## 2022-08-25 DIAGNOSIS — Z0000 Encounter for general adult medical examination without abnormal findings: Secondary | ICD-10-CM

## 2022-08-25 NOTE — Patient Instructions (Signed)
Ms. Deanna Buck , Thank you for taking time to come for your Medicare Wellness Visit. I appreciate your ongoing commitment to your health goals. Please review the following plan we discussed and let me know if I can assist you in the future.   Referrals/Orders/Follow-Ups/Clinician Recommendations: You are due to have your Tetanus vaccine and remember to get your 2nd dose of the Shingrix vaccine.  Keep up the good work and it was a pleasure speaking with you today.   This is a list of the screening recommended for you and due dates:  Health Maintenance  Topic Date Due   DTaP/Tdap/Td vaccine (3 - Td or Tdap) 02/13/2021   COVID-19 Vaccine (7 - 2023-24 season) 12/26/2021   Zoster (Shingles) Vaccine (2 of 2) 07/29/2022   Flu Shot  08/14/2022   Medicare Annual Wellness Visit  08/25/2023   Pneumonia Vaccine  Completed   DEXA scan (bone density measurement)  Completed   Hepatitis C Screening  Completed   HPV Vaccine  Aged Out   Cologuard (Stool DNA test)  Discontinued    Advanced directives: (Copy Requested) Please bring a copy of your health care power of attorney and living will to the office to be added to your chart at your convenience.  Next Medicare Annual Wellness Visit scheduled for next year: Yes  Preventive Care 76 Years and Older, Female Preventive care refers to lifestyle choices and visits with your health care provider that can promote health and wellness. What does preventive care include? A yearly physical exam. This is also called an annual well check. Dental exams once or twice a year. Routine eye exams. Ask your health care provider how often you should have your eyes checked. Personal lifestyle choices, including: Daily care of your teeth and gums. Regular physical activity. Eating a healthy diet. Avoiding tobacco and drug use. Limiting alcohol use. Practicing safe sex. Taking low-dose aspirin every day. Taking vitamin and mineral supplements as recommended by your health  care provider. What happens during an annual well check? The services and screenings done by your health care provider during your annual well check will depend on your age, overall health, lifestyle risk factors, and family history of disease. Counseling  Your health care provider may ask you questions about your: Alcohol use. Tobacco use. Drug use. Emotional well-being. Home and relationship well-being. Sexual activity. Eating habits. History of falls. Memory and ability to understand (cognition). Work and work Astronomer. Reproductive health. Screening  You may have the following tests or measurements: Height, weight, and BMI. Blood pressure. Lipid and cholesterol levels. These may be checked every 5 years, or more frequently if you are over 76 years old. Skin check. Lung cancer screening. You may have this screening every year starting at age 76 if you have a 30-pack-year history of smoking and currently smoke or have quit within the past 15 years. Fecal occult blood test (FOBT) of the stool. You may have this test every year starting at age 76. Flexible sigmoidoscopy or colonoscopy. You may have a sigmoidoscopy every 5 years or a colonoscopy every 10 years starting at age 76. Hepatitis C blood test. Hepatitis B blood test. Sexually transmitted disease (STD) testing. Diabetes screening. This is done by checking your blood sugar (glucose) after you have not eaten for a while (fasting). You may have this done every 1-3 years. Bone density scan. This is done to screen for osteoporosis. You may have this done starting at age 76. Mammogram. This may be done every 1-2  years. Talk to your health care provider about how often you should have regular mammograms. Talk with your health care provider about your test results, treatment options, and if necessary, the need for more tests. Vaccines  Your health care provider may recommend certain vaccines, such as: Influenza vaccine. This is  recommended every year. Tetanus, diphtheria, and acellular pertussis (Tdap, Td) vaccine. You may need a Td booster every 10 years. Zoster vaccine. You may need this after age 86. Pneumococcal 13-valent conjugate (PCV13) vaccine. One dose is recommended after age 76. Pneumococcal polysaccharide (PPSV23) vaccine. One dose is recommended after age 76. Talk to your health care provider about which screenings and vaccines you need and how often you need them. This information is not intended to replace advice given to you by your health care provider. Make sure you discuss any questions you have with your health care provider. Document Released: 01/26/2015 Document Revised: 09/19/2015 Document Reviewed: 10/31/2014 Elsevier Interactive Patient Education  2017 [employer].  Fall Prevention in the Home Falls can cause injuries. They can happen to people of all ages. There are many things you can do to make your home safe and to help prevent falls. What can I do on the outside of my home? Regularly fix the edges of walkways and driveways and fix any cracks. Remove anything that might make you trip as you walk through a door, such as a raised step or threshold. Trim any bushes or trees on the path to your home. Use bright outdoor lighting. Clear any walking paths of anything that might make someone trip, such as rocks or tools. Regularly check to see if handrails are loose or broken. Make sure that both sides of any steps have handrails. Any raised decks and porches should have guardrails on the edges. Have any leaves, snow, or ice cleared regularly. Use sand or salt on walking paths during winter. Clean up any spills in your garage right away. This includes oil or grease spills. What can I do in the bathroom? Use night lights. Install grab bars by the toilet and in the tub and shower. Do not use towel bars as grab bars. Use non-skid mats or decals in the tub or shower. If you need to sit down in  the shower, use a plastic, non-slip stool. Keep the floor dry. Clean up any water that spills on the floor as soon as it happens. Remove soap buildup in the tub or shower regularly. Attach bath mats securely with double-sided non-slip rug tape. Do not have throw rugs and other things on the floor that can make you trip. What can I do in the bedroom? Use night lights. Make sure that you have a light by your bed that is easy to reach. Do not use any sheets or blankets that are too big for your bed. They should not hang down onto the floor. Have a firm chair that has side arms. You can use this for support while you get dressed. Do not have throw rugs and other things on the floor that can make you trip. What can I do in the kitchen? Clean up any spills right away. Avoid walking on wet floors. Keep items that you use a lot in easy-to-reach places. If you need to reach something above you, use a strong step stool that has a grab bar. Keep electrical cords out of the way. Do not use floor polish or wax that makes floors slippery. If you must use wax, use non-skid floor  wax. Do not have throw rugs and other things on the floor that can make you trip. What can I do with my stairs? Do not leave any items on the stairs. Make sure that there are handrails on both sides of the stairs and use them. Fix handrails that are broken or loose. Make sure that handrails are as long as the stairways. Check any carpeting to make sure that it is firmly attached to the stairs. Fix any carpet that is loose or worn. Avoid having throw rugs at the top or bottom of the stairs. If you do have throw rugs, attach them to the floor with carpet tape. Make sure that you have a light switch at the top of the stairs and the bottom of the stairs. If you do not have them, ask someone to add them for you. What else can I do to help prevent falls? Wear shoes that: Do not have high heels. Have rubber bottoms. Are comfortable  and fit you well. Are closed at the toe. Do not wear sandals. If you use a stepladder: Make sure that it is fully opened. Do not climb a closed stepladder. Make sure that both sides of the stepladder are locked into place. Ask someone to hold it for you, if possible. Clearly mark and make sure that you can see: Any grab bars or handrails. First and last steps. Where the edge of each step is. Use tools that help you move around (mobility aids) if they are needed. These include: Canes. Walkers. Scooters. Crutches. Turn on the lights when you go into a dark area. Replace any light bulbs as soon as they burn out. Set up your furniture so you have a clear path. Avoid moving your furniture around. If any of your floors are uneven, fix them. If there are any pets around you, be aware of where they are. Review your medicines with your doctor. Some medicines can make you feel dizzy. This can increase your chance of falling. Ask your doctor what other things that you can do to help prevent falls. This information is not intended to replace advice given to you by your health care provider. Make sure you discuss any questions you have with your health care provider. Document Released: 10/26/2008 Document Revised: 06/07/2015 Document Reviewed: 02/03/2014 Elsevier Interactive Patient Education  2017 [employer].

## 2022-08-25 NOTE — Progress Notes (Signed)
Subjective:   Deanna Buck is a 77 y.o. female who presents for an Initial Medicare Annual Wellness Visit.  Visit Complete: In person  Review of Systems    Cardiac Risk Factors include: advanced age (>59men, >35 women);dyslipidemia;hypertension     Objective:    Today's Vitals   08/25/22 1341  BP: (!) 148/72  Weight: 162 lb 9.6 oz (73.8 kg)  Height: 5' 3.75" (1.619 m)   Body mass index is 28.13 kg/m.     08/25/2022    1:53 PM 05/10/2015   11:16 AM 10/24/2014   11:14 AM 10/23/2014    1:09 PM 09/07/2014   11:27 AM 05/30/2014    6:35 AM 05/25/2014    4:02 PM  Advanced Directives  Does Patient Have a Medical Advance Directive? Yes Yes Yes Yes Yes Yes Yes  Type of Estate agent of [place];Living will   Living will  Living will Living will  Does patient want to make changes to medical advance directive?      No - Patient declined   Copy of Healthcare Power of Attorney in Chart? No - copy requested  No - copy requested   No - copy requested     Current Medications (verified) Outpatient Encounter Medications as of 08/25/2022  Medication Sig   atorvastatin (LIPITOR) 20 MG tablet Take 1 tablet (20 mg total) by mouth daily.   Cholecalciferol (VITAMIN D) 2000 units tablet Take 2,000 Units by mouth daily.   COVID-19 mRNA vaccine 2023-2024 (COMIRNATY) syringe Inject into the muscle.   hydrochlorothiazide (HYDRODIURIL) 25 MG tablet Take 1 tablet (25 mg total) by mouth daily.   nadolol (CORGARD) 40 MG tablet TAKE 1/2 TABLET DAILY.   non-metallic deodorant (ALRA) MISC Apply 1 application topically daily as needed.   potassium chloride (KLOR-CON M10) 10 MEQ tablet TAKE 2 TABLETS (=20MEQ)    DAILY   triamcinolone cream (KENALOG) 0.1 % APPLY TWICE A DAY   vitamin B-12 (CYANOCOBALAMIN) 100 MCG tablet Take 50 mcg by mouth as needed. Every Other day.  Mon, Wed, Fri   Zoster Vaccine Adjuvanted [facility]) injection Inject 0.5 mLs into the muscle.   No  facility-administered encounter medications on file as of 08/25/2022.    Allergies (verified) Morphine and codeine, Sulfonamide derivatives, Banana, Doxycycline, Penicillins, and Sulfa antibiotics   History: Past Medical History:  Diagnosis Date   Abnormal mammogram 05/2013   L breast   ANXIETY    Breast cancer (HCC)    left breast   ECZEMA    Full dentures    GERD    Hypertension    Pt states she takes Nadolol for Anxiety, not BP   OA (osteoarthritis)    Radiation 10/24/13-11/30/13   left chest wall and axilla   Wears contact lenses    also wears glasses   Past Surgical History:  Procedure Laterality Date   BREAST IMPLANT REMOVAL Left 10/24/2014   Procedure: REMOVAL LEFT BREAST IMPLANT, Excision of Nipple Areola Left Breast;  Surgeon: Glenna Fellows, MD;  Location: [facility];  Service: Plastics;  Laterality: Left;   BREAST LUMPECTOMY  1982   rt and lt benign   BREAST RECONSTRUCTION WITH PLACEMENT OF TISSUE EXPANDER AND FLEX HD (ACELLULAR HYDRATED DERMIS) Left 08/04/2013   Procedure: BREAST RECONSTRUCTION WITH PLACEMENT OF TISSUE EXPANDER AND  POSSIBLE FLEX HD (ACELLULAR HYDRATED DERMIS);  Surgeon: Glenna Fellows, MD;  Location: [facility];  Service: Plastics;  Laterality: Left;   INCISION AND DRAINAGE OF WOUND  Left 08/30/2013   Procedure: DEBRIDEMENT OF LEFT BREAST;  Surgeon: Glenna Fellows, MD;  Location: [facility];  Service: Plastics;  Laterality: Left;   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, had a second breast biopsy (R) in 2007   LIPOSUCTION WITH LIPOFILLING Left 05/30/2014   Procedure: LIPOFILLING TO LEFT CHEST;  Surgeon: Glenna Fellows, MD;  Location: [facility];  Service: Plastics;  Laterality: Left;   MASS EXCISION Right 05/30/2014   Procedure: EXCISION OF RIGHT AXILLARY LIPOMA;  Surgeon: Glenna Fellows, MD;  Location: [facility];  Service: Plastics;  Laterality: Right;    MASTECTOMY Left    MASTOPEXY Right 05/30/2014   Procedure: RIGHT MASTOPEXY FOR SYMMETRY;  Surgeon: Glenna Fellows, MD;  Location: [facility];  Service: Plastics;  Laterality: Right;   REDUCTION MAMMAPLASTY Right 1980   REMOVAL OF TISSUE EXPANDER AND PLACEMENT OF IMPLANT Left 05/30/2014   Procedure: REMOVAL OF LEFT TISSUE EXPANDER AND PLACEMENT OF IMPLANT;  Surgeon: Glenna Fellows, MD;  Location: [facility];  Service: Plastics;  Laterality: Left;   Family History  Problem Relation Age of Onset   Breast cancer Mother    Dementia Mother    Arthritis Other    Hypertension Other        parent   Hyperlipidemia Other        parent   Kidney disease Other        parent   Social History   Socioeconomic History   Marital status: Married    Spouse name: Alinda Money   Number of children: 2   Years of education: Not on file   Highest education level: Not on file  Occupational History    Employer: RETIRED  Tobacco Use   Smoking status: Never   Smokeless tobacco: Never   Tobacco comments:    Married, but in stressful relationship with spouse  Vaping Use   Vaping status: Never Used  Substance and Sexual Activity   Alcohol use: Yes    Comment:  rare mixed drink   Drug use: No   Sexual activity: Yes  Other Topics Concern   Not on file  Social History Narrative   Not on file   Social Determinants of Health   Financial Resource Strain: Not on file  Food Insecurity: Not on file  Transportation Needs: Not on file  Physical Activity: Inactive (08/25/2022)   Exercise Vital Sign    Days of Exercise per Week: 0 days    Minutes of Exercise per Session: 0 min  Stress: Not on file  Social Connections: Unknown (08/25/2022)   Social Connection and Isolation Panel [NHANES]    Frequency of Communication with Friends and Family: More than three times a week    Frequency of Social Gatherings with Friends and Family: Once a week    Attends Religious Services: Never     Diplomatic Services operational officer: Not on file    Attends Banker Meetings: Not on file    Marital Status: Not on file    Tobacco Counseling Counseling given: Not Answered Tobacco comments: Married, but in stressful relationship with spouse   Clinical Intake:  Pre-visit preparation completed: Yes  Pain : No/denies pain     BMI - recorded: 28.13 Nutritional Status: BMI 25 -29 Overweight Nutritional Risks: None Diabetes: No  How often do you need to have someone help you when you read instructions, pamphlets, or other written materials from  your doctor or pharmacy?: 1 - Never  Interpreter Needed?: No  Information entered by ::  , RMA   Activities of Daily Living    08/25/2022    1:47 PM  In your present state of health, do you have any difficulty performing the following activities:  Hearing? 0  Vision? 0  Difficulty concentrating or making decisions? 1  Comment remembering  Walking or climbing stairs? 0  Dressing or bathing? 0  Doing errands, shopping? 0  Preparing Food and eating ? N  Using the Toilet? N  In the past six months, have you accidently leaked urine? Y  Comment will if she holds it too long  Do you have problems with loss of bowel control? N  Managing your Medications? N  Managing your Finances? N  Housekeeping or managing your Housekeeping? N    Patient Care Team: Corwin Levins, MD as PCP - General (Internal Medicine) Carrington Clamp, MD as Consulting Physician (Obstetrics and Gynecology) Donzetta Starch, MD as Consulting Physician (Dermatology) Magrinat, Valentino Hue, MD (Inactive) as Consulting Physician (Oncology)  Indicate any recent Medical Services you may have received from other than [facility] providers in the past year (date may be approximate).     Assessment:   This is a routine wellness examination for [place].  Hearing/Vision screen Hearing Screening - Comments:: Denies hearing difficulties   Vision  Screening - Comments:: Wears contacts  Dietary issues and exercise activities discussed:     Goals Addressed   None   Depression Screen    09/23/2021   11:37 AM 09/23/2021   11:26 AM 09/12/2020   11:32 AM 09/12/2020   11:07 AM 08/19/2019   11:23 AM 08/19/2019   11:00 AM 08/17/2018   10:58 AM  PHQ 2/9 Scores  PHQ - 2 Score 0 0 0 0 1 0 0  PHQ- 9 Score  0         Fall Risk    08/25/2022    1:53 PM 09/23/2021   11:37 AM 09/23/2021   11:26 AM 09/12/2020   11:31 AM 09/12/2020   11:07 AM  Fall Risk   Falls in the past year? 0 0 0 0 0  Number falls in past yr: 0 0  0 0  Injury with Fall? 0 0 0 0 0  Risk for fall due to : No Fall Risks  No Fall Risks    Follow up Falls prevention discussed  Falls evaluation completed      MEDICARE RISK AT HOME:  Medicare Risk at Home - 08/25/22 1353     Any stairs in or around the home? Yes    If so, are there any without handrails? Yes    Home free of loose throw rugs in walkways, pet beds, electrical cords, etc? Yes    Adequate lighting in your home to reduce risk of falls? Yes    Life alert? No    Use of a cane, walker or w/c? No    Grab bars in the bathroom? No    Shower chair or bench in shower? Yes    Elevated toilet seat or a handicapped toilet? Yes             TIMED UP AND GO:  Was the test performed? Yes  Length of time to ambulate 10 feet: 10 sec Gait steady and fast without use of assistive device    Cognitive Function:        08/25/2022    1:54 PM  6CIT  Screen  What Year? 0 points  What month? 0 points  What time? 3 points  Count back from 20 0 points  Months in reverse 0 points  Repeat phrase 0 points  Total Score 3 points    Immunizations Immunization History  Administered Date(s) Administered   COVID-19, mRNA, vaccine(Comirnaty)12 years and older 10/31/2021   Fluad Quad(high Dose 65+) 10/11/2018, 11/26/2019, 09/12/2020, 09/23/2021   Influenza Split 12/11/2010, 11/18/2011   Influenza Whole 12/13/2008,  10/23/2009   Influenza, High Dose Seasonal PF 11/17/2014, 10/11/2015, 09/29/2016, 10/05/2017   Influenza,inj,Quad PF,6+ Mos 10/04/2012, 10/04/2013   Influenza-Unspecified 10/14/2011, 10/11/2015, 10/13/2017   MMR 02/14/1951   PFIZER Comirnaty(Gray Top)Covid-19 Tri-Sucrose Vaccine 06/01/2020   PFIZER(Purple Top)SARS-COV-2 Vaccination 02/26/2019, 03/20/2019, 12/17/2019   Pfizer Covid-19 Vaccine Bivalent Booster 32yrs & up 10/10/2020   Pneumococcal Conjugate-13 08/03/2014   Pneumococcal Polysaccharide-23 03/17/2011   Td 12/13/2008   Tdap 02/14/2011   Varicella 02/14/1951   Zoster Recombinant(Shingrix) 06/03/2022    TDAP status: Due, Education has been provided regarding the importance of this vaccine. Advised may receive this vaccine at local pharmacy or Health Dept. Aware to provide a copy of the vaccination record if obtained from local pharmacy or Health Dept. Verbalized acceptance and understanding.  Flu Vaccine status: Up to date  Pneumococcal vaccine status: Up to date  Covid-19 vaccine status: Completed vaccines  Qualifies for Shingles Vaccine? Yes   Zostavax completed Yes   Shingrix Completed?: No.    Education has been provided regarding the importance of this vaccine. Patient has been advised to call insurance company to determine out of pocket expense if they have not yet received this vaccine. Advised may also receive vaccine at local pharmacy or Health Dept. Verbalized acceptance and understanding.  Screening Tests Health Maintenance  Topic Date Due   DTaP/Tdap/Td (3 - Td or Tdap) 02/13/2021   COVID-19 Vaccine (7 - 2023-24 season) 12/26/2021   Zoster Vaccines- Shingrix (2 of 2) 07/29/2022   INFLUENZA VACCINE  08/14/2022   Medicare Annual Wellness (AWV)  08/25/2023   Pneumonia Vaccine 45+ Years old  Completed   DEXA SCAN  Completed   Hepatitis C Screening  Completed   HPV VACCINES  Aged Out   Fecal DNA (Cologuard)  Discontinued    Health Maintenance  Health  Maintenance Due  Topic Date Due   DTaP/Tdap/Td (3 - Td or Tdap) 02/13/2021   COVID-19 Vaccine (7 - 2023-24 season) 12/26/2021   Zoster Vaccines- Shingrix (2 of 2) 07/29/2022   INFLUENZA VACCINE  08/14/2022    Colorectal cancer screening: Type of screening: Cologuard. Completed 09/26/2020. Repeat every 3 years  Mammogram status: Completed 01/15/2021. Repeat every year  Bone Density status: Completed 05/26/2016. Results reflect: Bone density results: OSTEOPOROSIS. Repeat every 2 years.  Lung Cancer Screening: (Low Dose CT Chest recommended if Age 41-80 years, 20 pack-year currently smoking OR have quit w/in 15years.) does not qualify.   Lung Cancer Screening Referral: N/A  Additional Screening:  Hepatitis C Screening: does qualify; Completed 08/11/2017  Vision Screening: Recommended annual ophthalmology exams for early detection of glaucoma and other disorders of the eye. Is the patient up to date with their annual eye exam?  No  Who is the provider or what is the name of the office in which the patient attends annual eye exams? Dr. Lucina Mellow If pt is not established with a provider, would they like to be referred to a provider to establish care? No .   Dental Screening: Recommended annual dental exams for proper oral  hygiene  Community Resource Referral / Chronic Care Management: CRR required this visit?  No   CCM required this visit?  No     Plan:     I have personally reviewed and noted the following in the patient's chart:   Medical and social history Use of alcohol, tobacco or illicit drugs  Current medications and supplements including opioid prescriptions. Patient is not currently taking opioid prescriptions. Functional ability and status Nutritional status Physical activity Advanced directives List of other physicians Hospitalizations, surgeries, and ER visits in previous 12 months Vitals Screenings to include cognitive, depression, and falls Referrals and  appointments  In addition, I have reviewed and discussed with patient certain preventive protocols, quality metrics, and best practice recommendations. A written personalized care plan for preventive services as well as general preventive health recommendations were provided to patient.      L , CMA   08/25/2022   After Visit Summary: (MyChart) Due to this being a telephonic visit, the after visit summary with patients personalized plan was offered to patient via MyChart   Nurse Notes: Patient is due for a Tdap and also her 2nd dose of Shingrix.  She is aware that she can get these done at her local pharmacy  Patient would like to discuss an over the counter supplement, Prevagen.  She would like to get Dr. Raphael Gibney opinion on taking it.  She would like to discuss this during her next office visit in September with Dr. Jonny Ruiz.  Patient declines any bone density screenings and  also any mammogram screenings.

## 2022-08-28 ENCOUNTER — Encounter (INDEPENDENT_AMBULATORY_CARE_PROVIDER_SITE_OTHER): Payer: Self-pay

## 2022-09-16 ENCOUNTER — Other Ambulatory Visit (HOSPITAL_BASED_OUTPATIENT_CLINIC_OR_DEPARTMENT_OTHER): Payer: Self-pay

## 2022-09-16 MED ORDER — SHINGRIX 50 MCG/0.5ML IM SUSR
0.5000 mL | Freq: Once | INTRAMUSCULAR | 0 refills | Status: AC
  Filled 2022-09-16: qty 0.5, 1d supply, fill #0

## 2022-09-22 DIAGNOSIS — E2839 Other primary ovarian failure: Secondary | ICD-10-CM | POA: Insufficient documentation

## 2022-09-25 ENCOUNTER — Other Ambulatory Visit: Payer: Self-pay | Admitting: Obstetrics and Gynecology

## 2022-09-25 ENCOUNTER — Other Ambulatory Visit (INDEPENDENT_AMBULATORY_CARE_PROVIDER_SITE_OTHER): Payer: Medicare Other

## 2022-09-25 DIAGNOSIS — E538 Deficiency of other specified B group vitamins: Secondary | ICD-10-CM | POA: Diagnosis not present

## 2022-09-25 DIAGNOSIS — E559 Vitamin D deficiency, unspecified: Secondary | ICD-10-CM | POA: Diagnosis not present

## 2022-09-25 DIAGNOSIS — I10 Essential (primary) hypertension: Secondary | ICD-10-CM | POA: Diagnosis not present

## 2022-09-25 DIAGNOSIS — R739 Hyperglycemia, unspecified: Secondary | ICD-10-CM | POA: Diagnosis not present

## 2022-09-25 DIAGNOSIS — E2839 Other primary ovarian failure: Secondary | ICD-10-CM

## 2022-09-25 LAB — URINALYSIS, ROUTINE W REFLEX MICROSCOPIC
Bilirubin Urine: NEGATIVE
Hgb urine dipstick: NEGATIVE
Ketones, ur: NEGATIVE
Leukocytes,Ua: NEGATIVE
Nitrite: NEGATIVE
RBC / HPF: NONE SEEN (ref 0–?)
Specific Gravity, Urine: 1.02 (ref 1.000–1.030)
Total Protein, Urine: NEGATIVE
Urine Glucose: NEGATIVE
Urobilinogen, UA: 1 (ref 0.0–1.0)
pH: 7 (ref 5.0–8.0)

## 2022-09-25 LAB — BASIC METABOLIC PANEL
BUN: 13 mg/dL (ref 6–23)
CO2: 29 meq/L (ref 19–32)
Calcium: 9.8 mg/dL (ref 8.4–10.5)
Chloride: 106 meq/L (ref 96–112)
Creatinine, Ser: 0.66 mg/dL (ref 0.40–1.20)
GFR: 85.13 mL/min (ref >60.00)
Glucose, Bld: 109 mg/dL — ABNORMAL HIGH (ref 70–99)
Potassium: 3.6 meq/L (ref 3.5–5.1)
Sodium: 143 meq/L (ref 135–145)

## 2022-09-25 LAB — HEPATIC FUNCTION PANEL
ALT: 18 U/L (ref 0–35)
AST: 27 U/L (ref 0–37)
Albumin: 3.5 g/dL (ref 3.5–5.2)
Alkaline Phosphatase: 130 U/L — ABNORMAL HIGH (ref 39–117)
Bilirubin, Direct: 0.3 mg/dL (ref 0.0–0.3)
Total Bilirubin: 1.5 mg/dL — ABNORMAL HIGH (ref 0.2–1.2)
Total Protein: 6.5 g/dL (ref 6.0–8.3)

## 2022-09-25 LAB — LIPID PANEL
Cholesterol: 121 mg/dL (ref 0–200)
HDL: 59.5 mg/dL (ref >39.00)
LDL Cholesterol: 47 mg/dL (ref 0–99)
NonHDL: 61.5
Total CHOL/HDL Ratio: 2
Triglycerides: 75 mg/dL (ref 0.0–149.0)
VLDL: 15 mg/dL (ref 0.0–40.0)

## 2022-09-25 LAB — CBC WITH DIFFERENTIAL/PLATELET
Basophils Absolute: 0 10*3/uL (ref 0.0–0.1)
Basophils Relative: 0.9 % (ref 0.0–3.0)
Eosinophils Absolute: 0.2 10*3/uL (ref 0.0–0.7)
Eosinophils Relative: 3.8 % (ref 0.0–5.0)
HCT: 36.8 % (ref 36.0–46.0)
Hemoglobin: 12.3 g/dL (ref 12.0–15.0)
Lymphocytes Relative: 18.9 % (ref 12.0–46.0)
Lymphs Abs: 1 10*3/uL (ref 0.7–4.0)
MCHC: 33.3 g/dL (ref 30.0–36.0)
MCV: 90.8 fl (ref 78.0–100.0)
Monocytes Absolute: 0.7 10*3/uL (ref 0.1–1.0)
Monocytes Relative: 12.6 % — ABNORMAL HIGH (ref 3.0–12.0)
Neutro Abs: 3.4 10*3/uL (ref 1.4–7.7)
Neutrophils Relative %: 63.8 % (ref 43.0–77.0)
Platelets: 162 10*3/uL (ref 150.0–400.0)
RBC: 4.05 Mil/uL (ref 3.87–5.11)
RDW: 13.2 % (ref 11.5–15.5)
WBC: 5.3 10*3/uL (ref 4.0–10.5)

## 2022-09-25 LAB — VITAMIN B12: Vitamin B-12: 980 pg/mL — ABNORMAL HIGH (ref 211–911)

## 2022-09-25 LAB — HM PAP SMEAR: HPV, high-risk: NEGATIVE

## 2022-09-25 LAB — HEMOGLOBIN A1C: Hgb A1c MFr Bld: 5 % (ref 4.6–6.5)

## 2022-09-25 LAB — TSH: TSH: 2.16 u[IU]/mL (ref 0.35–5.50)

## 2022-09-25 LAB — VITAMIN D 25 HYDROXY (VIT D DEFICIENCY, FRACTURES): VITD: 59.08 ng/mL (ref 30.00–100.00)

## 2022-09-29 ENCOUNTER — Ambulatory Visit (INDEPENDENT_AMBULATORY_CARE_PROVIDER_SITE_OTHER): Payer: Medicare Other | Admitting: Internal Medicine

## 2022-09-29 ENCOUNTER — Other Ambulatory Visit (HOSPITAL_BASED_OUTPATIENT_CLINIC_OR_DEPARTMENT_OTHER): Payer: Self-pay

## 2022-09-29 ENCOUNTER — Encounter: Payer: Self-pay | Admitting: Internal Medicine

## 2022-09-29 VITALS — BP 124/78 | HR 55 | Temp 98.2°F | Ht 63.75 in | Wt 160.0 lb

## 2022-09-29 DIAGNOSIS — Z0000 Encounter for general adult medical examination without abnormal findings: Secondary | ICD-10-CM

## 2022-09-29 DIAGNOSIS — E559 Vitamin D deficiency, unspecified: Secondary | ICD-10-CM | POA: Diagnosis not present

## 2022-09-29 DIAGNOSIS — R739 Hyperglycemia, unspecified: Secondary | ICD-10-CM

## 2022-09-29 DIAGNOSIS — E785 Hyperlipidemia, unspecified: Secondary | ICD-10-CM

## 2022-09-29 DIAGNOSIS — E876 Hypokalemia: Secondary | ICD-10-CM

## 2022-09-29 DIAGNOSIS — E538 Deficiency of other specified B group vitamins: Secondary | ICD-10-CM | POA: Diagnosis not present

## 2022-09-29 DIAGNOSIS — M8580 Other specified disorders of bone density and structure, unspecified site: Secondary | ICD-10-CM | POA: Diagnosis not present

## 2022-09-29 DIAGNOSIS — R7989 Other specified abnormal findings of blood chemistry: Secondary | ICD-10-CM | POA: Diagnosis not present

## 2022-09-29 DIAGNOSIS — J309 Allergic rhinitis, unspecified: Secondary | ICD-10-CM | POA: Diagnosis not present

## 2022-09-29 DIAGNOSIS — Z0001 Encounter for general adult medical examination with abnormal findings: Secondary | ICD-10-CM

## 2022-09-29 MED ORDER — HYDROCHLOROTHIAZIDE 25 MG PO TABS
25.0000 mg | ORAL_TABLET | Freq: Every day | ORAL | 3 refills | Status: DC
Start: 2022-09-29 — End: 2023-10-16

## 2022-09-29 MED ORDER — POTASSIUM CHLORIDE CRYS ER 10 MEQ PO TBCR: EXTENDED_RELEASE_TABLET | ORAL | 3 refills | Status: DC

## 2022-09-29 MED ORDER — ATORVASTATIN CALCIUM 20 MG PO TABS: 20.0000 mg | ORAL_TABLET | Freq: Every day | ORAL | 3 refills | Status: DC

## 2022-09-29 MED ORDER — COVID-19 MRNA VAC-TRIS(PFIZER) 30 MCG/0.3ML IM SUSY
0.3000 mL | PREFILLED_SYRINGE | Freq: Once | INTRAMUSCULAR | 0 refills | Status: AC
  Filled 2022-09-29: qty 0.3, 1d supply, fill #0

## 2022-09-29 MED ORDER — NADOLOL 40 MG PO TABS: ORAL_TABLET | ORAL | 3 refills | Status: DC

## 2022-09-29 NOTE — Progress Notes (Unsigned)
Patient ID: Deanna Buck, female   DOB: December 17, 1945, 77 y.o.   MRN: [id number]         Chief Complaint:: wellness exam and elevated b12, allergies, abnormal LFTs       HPI:  Deanna Buck is a 77 y.o. female here for wellness exam; was GYN last wk doing ok - results pending; due for mammogram soon but has not called yet - will do soon; for covid booster later today; for tdap at pharmacy, has DXA scheduled soon as well.                          Also leaving out of town to [place] later this wk.  Does have several wks ongoing nasal allergy symptoms with clearish congestion, itch and sneezing, without fever, pain, ST, cough, swelling or wheezing.  Taking B12 daily.  Denies worsening reflux, abd pain, dysphagia, n/v, bowel change or blood.  Pt denies chest pain, increased sob or doe, wheezing, orthopnea, PND, increased LE swelling, palpitations, dizziness or syncope.   Pt denies polydipsia, polyuria, or new focal neuro s/s.      Wt Readings from Last 3 Encounters:  09/29/22 160 lb (72.6 kg)  08/25/22 162 lb 9.6 oz (73.8 kg)  09/23/21 162 lb (73.5 kg)   BP Readings from Last 3 Encounters:  09/29/22 124/78  08/25/22 (!) 148/72  09/23/21 136/80   Immunization History  Administered Date(s) Administered   COVID-19, mRNA, vaccine(Comirnaty)12 years and older 10/31/2021, 09/29/2022   Fluad Quad(high Dose 65+) 10/11/2018, 11/26/2019, 09/12/2020, 09/23/2021   Influenza Split 12/11/2010, 11/18/2011   Influenza Whole 12/13/2008, 10/23/2009   Influenza, High Dose Seasonal PF 11/17/2014, 10/11/2015, 09/29/2016, 10/05/2017   Influenza,inj,Quad PF,6+ Mos 10/04/2012, 10/04/2013   Influenza-Unspecified 10/14/2011, 10/11/2015, 10/13/2017   MMR 02[birth year]/1952   PFIZER Comirnaty(Gray Top)Covid-19 Tri-Sucrose Vaccine 06/01/2020   PFIZER(Purple Top)SARS-COV-2 Vaccination 02/26/2019, 03/20/2019, 12/17/2019   Pfizer Covid-19 Vaccine Bivalent Booster 38yrs & up 10/10/2020   Pneumococcal Conjugate-13 08/03/2014    Pneumococcal Polysaccharide-23 03/17/2011   Td 12/13/2008   Tdap 02/14/2011   Varicella 02/14/1952   Zoster Recombinant(Shingrix) 06/03/2022, 09/16/2022   Health Maintenance Due  Topic Date Due   DTaP/Tdap/Td (3 - Td or Tdap) 02/13/2021      Past Medical History:  Diagnosis Date   Abnormal mammogram 05/2013   L breast   ANXIETY    Breast cancer (HCC)    left breast   ECZEMA    Full dentures    GERD    Hypertension    Pt states she takes Nadolol for Anxiety, not BP   OA (osteoarthritis)    Radiation 10/24/13-11/30/13   left chest wall and axilla   Wears contact lenses    also wears glasses   Past Surgical History:  Procedure Laterality Date   BREAST IMPLANT REMOVAL Left 10/24/2014   Procedure: REMOVAL LEFT BREAST IMPLANT, Excision of Nipple Areola Left Breast;  Surgeon: Glenna Fellows, MD;  Location: [facility];  Service: Plastics;  Laterality: Left;   BREAST LUMPECTOMY  1982   rt and lt benign   BREAST RECONSTRUCTION WITH PLACEMENT OF TISSUE EXPANDER AND FLEX HD (ACELLULAR HYDRATED DERMIS) Left 08/04/2013   Procedure: BREAST RECONSTRUCTION WITH PLACEMENT OF TISSUE EXPANDER AND  POSSIBLE FLEX HD (ACELLULAR HYDRATED DERMIS);  Surgeon: Glenna Fellows, MD;  Location:  SUR[facility]ERY CENTER;  Service: Plastics;  Laterality: Left;   INCISION AND DRAINAGE OF WOUND Left 08/30/2013   Procedure: DEBRIDEMENT OF LEFT  BREAST;  Surgeon: Glenna Fellows, MD;  Location: [facility];  Service: Plastics;  Laterality: Left;   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, had a second breast biopsy (R) in 2007   LIPOSUCTION WITH LIPOFILLING Left 05/30/2014   Procedure: LIPOFILLING TO LEFT CHEST;  Surgeon: Glenna Fellows, MD;  Location: [facility];  Service: Plastics;  Laterality: Left;   MASS EXCISION Right 05/30/2014   Procedure: EXCISION OF RIGHT AXILLARY LIPOMA;  Surgeon: Glenna Fellows, MD;  Location: [facility];   Service: Plastics;  Laterality: Right;   MASTECTOMY Left    MASTOPEXY Right 05/30/2014   Procedure: RIGHT MASTOPEXY FOR SYMMETRY;  Surgeon: Glenna Fellows, MD;  Location: [facility];  Service: Plastics;  Laterality: Right;   REDUCTION MAMMAPLASTY Right 1980   REMOVAL OF TISSUE EXPANDER AND PLACEMENT OF IMPLANT Left 05/30/2014   Procedure: REMOVAL OF LEFT TISSUE EXPANDER AND PLACEMENT OF IMPLANT;  Surgeon: Glenna Fellows, MD;  Location: [facility];  Service: Plastics;  Laterality: Left;    reports that she has never smoked. She has never used smokeless tobacco. She reports current alcohol use. She reports that she does not use drugs. family history includes Arthritis in an other family member; Breast cancer in her mother; Dementia in her mother; Hyperlipidemia in an other family member; Hypertension in an other family member; Kidney disease in an other family member. Allergies  Allergen Reactions   Morphine And Codeine Shortness Of Breath   Sulfonamide Derivatives Hives and Itching   Doxycycline Itching    And nausea   Penicillins Other (See Comments)    Dr. Margrett Rud told her she was allergic to PCN.   Sulfa Antibiotics Hives   Current Outpatient Medications on File Prior to Visit  Medication Sig Dispense Refill   Cholecalciferol (VITAMIN D) 2000 units tablet Take 2,000 Units by mouth daily.     COVID-19 mRNA vaccine 2023-2024 (COMIRNATY) syringe Inject into the muscle. 0.3 mL 0   non-metallic deodorant (ALRA) MISC Apply 1 application topically daily as needed.     triamcinolone cream (KENALOG) 0.1 % APPLY TWICE A DAY 60 g 2   Zoster Vaccine Adjuvanted [facility]) injection Inject 0.5 mLs into the muscle. 0.5 mL 0   vitamin B-12 (CYANOCOBALAMIN) 100 MCG tablet Take 50 mcg by mouth as needed. Every Other day.  Mon, Wed, Fri (Patient not taking: Reported on 09/29/2022)     No current facility-administered medications on file prior to visit.        ROS:   All others reviewed and negative.  Objective        PE:  BP 124/78 (BP Location: Left Arm, Patient Position: Sitting, Cuff Size: Normal)   Pulse (!) 55   Temp 98.2 F (36.8 C) (Oral)   Ht 5' 3.75" (1.619 m)   Wt 160 lb (72.6 kg)   SpO2 96%   BMI 27.68 kg/m                 Constitutional: Pt appears in NAD               HENT: Head: NCAT.                Right Ear: External ear normal.                 Left Ear: External ear normal.  Eyes: . Pupils are equal, round, and reactive to light. Conjunctivae and EOM are normal               Nose: without d/c or deformity               Neck: Neck supple. Gross normal ROM               Cardiovascular: Normal rate and regular rhythm.                 Pulmonary/Chest: Effort normal and breath sounds without rales or wheezing.                Abd:  Soft, NT, ND, + BS, no organomegaly               Neurological: Pt is alert. At baseline orientation, motor grossly intact               Skin: Skin is warm. No rashes, no other new lesions, LE edema - none               Psychiatric: Pt behavior is normal without agitation   Micro: none  Cardiac tracings I have personally interpreted today:  none  Pertinent Radiological findings (summarize): none   Lab Results  Component Value Date   WBC 5.3 09/25/2022   HGB 12.3 09/25/2022   HCT 36.8 09/25/2022   PLT 162.0 09/25/2022   GLUCOSE 109 (H) 09/25/2022   CHOL 121 09/25/2022   TRIG 75.0 09/25/2022   HDL 59.50 09/25/2022   LDLDIRECT 84.0 08/12/2018   LDLCALC 47 09/25/2022   ALT 18 09/25/2022   AST 27 09/25/2022   NA 143 09/25/2022   K 3.6 09/25/2022   CL 106 09/25/2022   CREATININE 0.66 09/25/2022   BUN 13 09/25/2022   CO2 29 09/25/2022   TSH 2.16 09/25/2022   HGBA1C 5.0 09/25/2022   Assessment/Plan:  BETHZAIDA BARGIEL is a 77 y.o. White or Caucasian [1] female with  has a past medical history of Abnormal mammogram (05/2013), ANXIETY, Breast cancer (HCC), ECZEMA, Full dentures,  GERD, Hypertension, OA (osteoarthritis), Radiation (10/24/13-11/30/13), and Wears contact lenses.  Encounter for well adult exam with abnormal findings Age and sex appropriate education and counseling updated with regular exercise and diet Referrals for preventative services - for GYN exam, mammogram, DXA soon Immunizations addressed - for covid booster and tdap at pharmacy Smoking counseling  - none needed Evidence for depression or other mood disorder - none significant Most recent labs reviewed. I have personally reviewed and have noted: 1) the patient's medical and social history 2) The patient's current medications and supplements 3) The patient's height, weight, and BMI have been recorded in the chart   Dyslipidemia Lab Results  Component Value Date   LDLCALC 47 09/25/2022   Stable, pt to continue current statin lipitor 20 qd   Hypokalemia Lab Results  Component Value Date   K 3.6 09/25/2022  Stable, cont current med tx  Osteopenia Has f/u DXA soon per GYN  Allergic rhinitis Mild to mod, for OTC allegra and/or nasacort asd, to f/u any worsening symptoms or concerns  B12 deficiency Lab Results  Component Value Date   VITAMINB12 980 (H) 09/25/2022   Mild overcontrolled, cont oral replacement - b12 1000 mcg at reduced 1 per week  Abnormal LFTs Lab Results  Component Value Date   ALT 18 09/25/2022   AST 27 09/25/2022   ALKPHOS 130 (H) 09/25/2022   BILITOT 1.5 (  H) 09/25/2022   ? Signficance, consider [place], f/u next labs  Followup: Return in about 1 year (around 09/29/2023).  Oliver Barre, MD 10/02/2022 5:26 AM  [facility]

## 2022-09-29 NOTE — Patient Instructions (Signed)
Ok to take the OTC Zytec 10 mg and OTC Nasacort for allergies as needed  Ok to reduce the B12 to 1 pill per week  Please call if you would want the ultrasound for the liver  Please return to the lab if you would want the lab tests for allergies done  Please continue all other medications as before, and refills have been done if requested.  Please have the pharmacy call with any other refills you may need.  Please continue your efforts at being more active, low cholesterol diet, and weight control.  You are otherwise up to date with prevention measures today.  Please keep your appointments with your specialists as you may have planned - the bone density and mammogram soon  Please make an Appointment to return for your 1 year visit, or sooner if needed, with Lab testing by Appointment as well, to be done about 3-5 days before at the FIRST FLOOR Lab (so this is for TWO appointments - please see the scheduling desk as you leave)

## 2022-10-02 ENCOUNTER — Encounter: Payer: Self-pay | Admitting: Internal Medicine

## 2022-10-02 DIAGNOSIS — J309 Allergic rhinitis, unspecified: Secondary | ICD-10-CM | POA: Insufficient documentation

## 2022-10-02 DIAGNOSIS — E538 Deficiency of other specified B group vitamins: Secondary | ICD-10-CM | POA: Insufficient documentation

## 2022-10-02 DIAGNOSIS — R7989 Other specified abnormal findings of blood chemistry: Secondary | ICD-10-CM | POA: Insufficient documentation

## 2022-10-02 NOTE — Assessment & Plan Note (Signed)
Age and sex appropriate education and counseling updated with regular exercise and diet Referrals for preventative services - for GYN exam, mammogram, DXA soon Immunizations addressed - for covid booster and tdap at pharmacy Smoking counseling  - none needed Evidence for depression or other mood disorder - none significant Most recent labs reviewed. I have personally reviewed and have noted: 1) the patient's medical and social history 2) The patient's current medications and supplements 3) The patient's height, weight, and BMI have been recorded in the chart

## 2022-10-02 NOTE — Assessment & Plan Note (Signed)
Has f/u DXA soon per GYN

## 2022-10-02 NOTE — Assessment & Plan Note (Signed)
Lab Results  Component Value Date   LDLCALC 47 09/25/2022   Stable, pt to continue current statin lipitor 20 qd

## 2022-10-02 NOTE — Assessment & Plan Note (Signed)
Lab Results  Component Value Date   ALT 18 09/25/2022   AST 27 09/25/2022   ALKPHOS 130 (H) 09/25/2022   BILITOT 1.5 (H) 09/25/2022   ? Signficance, consider [place], f/u next labs

## 2022-10-02 NOTE — Assessment & Plan Note (Signed)
Lab Results  Component Value Date   K 3.6 09/25/2022  Stable, cont current med tx

## 2022-10-02 NOTE — Assessment & Plan Note (Signed)
Lab Results  Component Value Date   VITAMINB12 980 (H) 09/25/2022   Mild overcontrolled, cont oral replacement - b12 1000 mcg at reduced 1 per week

## 2022-10-02 NOTE — Assessment & Plan Note (Signed)
Mild to mod, for OTC allegra and/or nasacort asd, to f/u any worsening symptoms or concerns

## 2022-10-27 ENCOUNTER — Other Ambulatory Visit (HOSPITAL_BASED_OUTPATIENT_CLINIC_OR_DEPARTMENT_OTHER): Payer: Self-pay

## 2022-10-27 MED ORDER — FLUAD 0.5 ML IM SUSY
0.5000 mL | PREFILLED_SYRINGE | Freq: Once | INTRAMUSCULAR | 0 refills | Status: AC
  Filled 2022-10-27: qty 0.5, 1d supply, fill #0

## 2022-11-01 ENCOUNTER — Other Ambulatory Visit: Payer: Self-pay | Admitting: Internal Medicine

## 2022-11-05 DIAGNOSIS — H52223 Regular astigmatism, bilateral: Secondary | ICD-10-CM | POA: Diagnosis not present

## 2022-11-05 DIAGNOSIS — H524 Presbyopia: Secondary | ICD-10-CM | POA: Diagnosis not present

## 2022-11-05 DIAGNOSIS — H5203 Hypermetropia, bilateral: Secondary | ICD-10-CM | POA: Diagnosis not present

## 2022-11-05 DIAGNOSIS — H25813 Combined forms of age-related cataract, bilateral: Secondary | ICD-10-CM | POA: Diagnosis not present

## 2022-12-09 ENCOUNTER — Other Ambulatory Visit (HOSPITAL_BASED_OUTPATIENT_CLINIC_OR_DEPARTMENT_OTHER): Payer: Self-pay

## 2022-12-09 MED ORDER — AREXVY 120 MCG/0.5ML IM SUSR
0.5000 mL | Freq: Once | INTRAMUSCULAR | 0 refills | Status: AC
  Filled 2022-12-09: qty 0.5, 1d supply, fill #0

## 2023-01-04 ENCOUNTER — Encounter: Payer: Self-pay | Admitting: Internal Medicine

## 2023-01-04 DIAGNOSIS — R300 Dysuria: Secondary | ICD-10-CM

## 2023-01-05 ENCOUNTER — Other Ambulatory Visit (INDEPENDENT_AMBULATORY_CARE_PROVIDER_SITE_OTHER): Payer: Medicare Other

## 2023-01-05 ENCOUNTER — Other Ambulatory Visit: Payer: Self-pay | Admitting: Internal Medicine

## 2023-01-05 DIAGNOSIS — R300 Dysuria: Secondary | ICD-10-CM | POA: Diagnosis not present

## 2023-01-05 LAB — URINALYSIS, ROUTINE W REFLEX MICROSCOPIC
Bilirubin Urine: NEGATIVE
Hgb urine dipstick: NEGATIVE
Ketones, ur: NEGATIVE
Leukocytes,Ua: NEGATIVE
Nitrite: NEGATIVE
RBC / HPF: NONE SEEN (ref 0–?)
Specific Gravity, Urine: 1.015 (ref 1.000–1.030)
Total Protein, Urine: NEGATIVE
Urine Glucose: NEGATIVE
Urobilinogen, UA: 0.2 (ref 0.0–1.0)
pH: 7 (ref 5.0–8.0)

## 2023-01-05 MED ORDER — CIPROFLOXACIN HCL 500 MG PO TABS: 500.0000 mg | ORAL_TABLET | Freq: Two times a day (BID) | ORAL | 0 refills | Status: AC

## 2023-01-07 LAB — URINE CULTURE

## 2023-05-01 ENCOUNTER — Other Ambulatory Visit: Payer: Medicare Other

## 2023-09-19 ENCOUNTER — Encounter: Payer: Self-pay | Admitting: Internal Medicine

## 2023-09-21 ENCOUNTER — Other Ambulatory Visit (HOSPITAL_BASED_OUTPATIENT_CLINIC_OR_DEPARTMENT_OTHER): Payer: Self-pay

## 2023-09-21 MED ORDER — FLUZONE HIGH-DOSE 0.5 ML IM SUSY
0.5000 mL | PREFILLED_SYRINGE | Freq: Once | INTRAMUSCULAR | 0 refills | Status: AC
  Filled 2023-09-21: qty 0.5, 1d supply, fill #0

## 2023-09-23 ENCOUNTER — Other Ambulatory Visit (INDEPENDENT_AMBULATORY_CARE_PROVIDER_SITE_OTHER)

## 2023-09-23 DIAGNOSIS — E559 Vitamin D deficiency, unspecified: Secondary | ICD-10-CM

## 2023-09-23 DIAGNOSIS — E785 Hyperlipidemia, unspecified: Secondary | ICD-10-CM | POA: Diagnosis not present

## 2023-09-23 DIAGNOSIS — R739 Hyperglycemia, unspecified: Secondary | ICD-10-CM

## 2023-09-23 DIAGNOSIS — E538 Deficiency of other specified B group vitamins: Secondary | ICD-10-CM

## 2023-09-23 LAB — CBC WITH DIFFERENTIAL/PLATELET
Basophils Absolute: 0 K/uL (ref 0.0–0.1)
Basophils Relative: 0.9 % (ref 0.0–3.0)
Eosinophils Absolute: 0.1 K/uL (ref 0.0–0.7)
Eosinophils Relative: 2.6 % (ref 0.0–5.0)
HCT: 36.2 % (ref 36.0–46.0)
Hemoglobin: 12.2 g/dL (ref 12.0–15.0)
Lymphocytes Relative: 20.6 % (ref 12.0–46.0)
Lymphs Abs: 1.1 K/uL (ref 0.7–4.0)
MCHC: 33.6 g/dL (ref 30.0–36.0)
MCV: 89.2 fl (ref 78.0–100.0)
Monocytes Absolute: 0.8 K/uL (ref 0.1–1.0)
Monocytes Relative: 15.9 % — ABNORMAL HIGH (ref 3.0–12.0)
Neutro Abs: 3.2 K/uL (ref 1.4–7.7)
Neutrophils Relative %: 60 % (ref 43.0–77.0)
Platelets: 148 K/uL — ABNORMAL LOW (ref 150.0–400.0)
RBC: 4.06 Mil/uL (ref 3.87–5.11)
RDW: 13.1 % (ref 11.5–15.5)
WBC: 5.3 K/uL (ref 4.0–10.5)

## 2023-09-23 LAB — HEPATIC FUNCTION PANEL
ALT: 17 U/L (ref 0–35)
AST: 25 U/L (ref 0–37)
Albumin: 3.7 g/dL (ref 3.5–5.2)
Alkaline Phosphatase: 129 U/L — ABNORMAL HIGH (ref 39–117)
Bilirubin, Direct: 0.2 mg/dL (ref 0.0–0.3)
Total Bilirubin: 1.4 mg/dL — ABNORMAL HIGH (ref 0.2–1.2)
Total Protein: 6.5 g/dL (ref 6.0–8.3)

## 2023-09-23 LAB — BASIC METABOLIC PANEL WITH GFR
BUN: 16 mg/dL (ref 6–23)
CO2: 30 meq/L (ref 19–32)
Calcium: 10.1 mg/dL (ref 8.4–10.5)
Chloride: 107 meq/L (ref 96–112)
Creatinine, Ser: 0.63 mg/dL (ref 0.40–1.20)
GFR: 85.49 mL/min (ref >60.00)
Glucose, Bld: 111 mg/dL — ABNORMAL HIGH (ref 70–99)
Potassium: 3.9 meq/L (ref 3.5–5.1)
Sodium: 142 meq/L (ref 135–145)

## 2023-09-23 LAB — TSH: TSH: 1.74 u[IU]/mL (ref 0.35–5.50)

## 2023-09-23 LAB — HEMOGLOBIN A1C: Hgb A1c MFr Bld: 5.2 % (ref 4.6–6.5)

## 2023-09-23 LAB — LIPID PANEL
Cholesterol: 113 mg/dL (ref 0–200)
HDL: 52.7 mg/dL (ref >39.00)
LDL Cholesterol: 45 mg/dL (ref 0–99)
NonHDL: 60.72
Total CHOL/HDL Ratio: 2
Triglycerides: 79 mg/dL (ref 0.0–149.0)
VLDL: 15.8 mg/dL (ref 0.0–40.0)

## 2023-09-23 LAB — VITAMIN B12: Vitamin B-12: 1110 pg/mL — ABNORMAL HIGH (ref 211–911)

## 2023-09-23 LAB — VITAMIN D 25 HYDROXY (VIT D DEFICIENCY, FRACTURES): VITD: 55.17 ng/mL (ref 30.00–100.00)

## 2023-09-24 ENCOUNTER — Other Ambulatory Visit: Payer: Self-pay | Admitting: Internal Medicine

## 2023-09-24 DIAGNOSIS — E876 Hypokalemia: Secondary | ICD-10-CM

## 2023-09-30 ENCOUNTER — Ambulatory Visit: Payer: Medicare Other | Admitting: Internal Medicine

## 2023-09-30 ENCOUNTER — Encounter: Payer: Self-pay | Admitting: Internal Medicine

## 2023-09-30 VITALS — BP 146/68 | HR 46 | Temp 98.0°F | Ht 63.75 in | Wt 157.4 lb

## 2023-09-30 DIAGNOSIS — F5101 Primary insomnia: Secondary | ICD-10-CM | POA: Diagnosis not present

## 2023-09-30 DIAGNOSIS — E785 Hyperlipidemia, unspecified: Secondary | ICD-10-CM | POA: Diagnosis not present

## 2023-09-30 DIAGNOSIS — R739 Hyperglycemia, unspecified: Secondary | ICD-10-CM

## 2023-09-30 DIAGNOSIS — E538 Deficiency of other specified B group vitamins: Secondary | ICD-10-CM | POA: Diagnosis not present

## 2023-09-30 DIAGNOSIS — Z0001 Encounter for general adult medical examination with abnormal findings: Secondary | ICD-10-CM

## 2023-09-30 DIAGNOSIS — R21 Rash and other nonspecific skin eruption: Secondary | ICD-10-CM

## 2023-09-30 DIAGNOSIS — Z1211 Encounter for screening for malignant neoplasm of colon: Secondary | ICD-10-CM | POA: Diagnosis not present

## 2023-09-30 DIAGNOSIS — E559 Vitamin D deficiency, unspecified: Secondary | ICD-10-CM

## 2023-09-30 DIAGNOSIS — G4700 Insomnia, unspecified: Secondary | ICD-10-CM | POA: Insufficient documentation

## 2023-09-30 DIAGNOSIS — J069 Acute upper respiratory infection, unspecified: Secondary | ICD-10-CM

## 2023-09-30 MED ORDER — TRIAMCINOLONE ACETONIDE 0.1 % EX CREA: TOPICAL_CREAM | CUTANEOUS | 2 refills | Status: AC

## 2023-09-30 MED ORDER — AZITHROMYCIN 250 MG PO TABS: ORAL_TABLET | ORAL | 1 refills | Status: AC

## 2023-09-30 NOTE — Assessment & Plan Note (Signed)
 Lab Results  Component Value Date   VITAMINB12 1,110 (H) 09/23/2023   Overcontroled, ok to cont oral replacement - b12 1000 mcg but reduce to twice per wk

## 2023-09-30 NOTE — Assessment & Plan Note (Signed)
 Mild to mod, for antibx course zpack,  to f/u any worsening symptoms or concern

## 2023-09-30 NOTE — Patient Instructions (Addendum)
 Please take all new medication as prescribed  - the antibiotic  Ok to decrease the B12 to twice per wk  Please continue all other medications as before, and refills have been done for the topical steroid cream  Ok to use OTC Melatonin up to 10 mg at bedtime as needed  Please have the pharmacy call with any other refills you may need.  Please continue your efforts at being more active, low cholesterol diet, and weight control.  You are otherwise up to date with prevention measures today.  Please keep your appointments with your specialists as you may have planned  You will be contacted regarding the referral for: cologuard  Your lab work was quite good today, and remember to stay hydrated.  Please make an Appointment to return for your 1 year visit, or sooner if needed, with Lab testing by Appointment as well, to be done about 3-5 days before at the FIRST FLOOR Lab (so this is for TWO appointments - please see the scheduling desk as you leave)

## 2023-09-30 NOTE — Progress Notes (Signed)
 Patient ID: Deanna Buck, female   DOB: 10-30-1945, 78 y.o.   MRN: [id number]         Chief Complaint:: wellness exam and Annual Exam (Annual Exam. FYI of URI like symptoms for the past week.)  , insomnia, left dorsal foot rash, low b12, hld       HPI:  Deanna Buck is a 78 y.o. female here for wellness exam; for cologuard, and pt will consider RSV shot, and Tdap at the pharmacy, o/w up to date                Also Pt denies chest pain, increased sob or doe, wheezing, orthopnea, PND, increased LE swelling, palpitations, dizziness or syncope.  Pt denies polydipsia, polyuria, or new focal neuro s/s, though c/o increasing forgetfulness in the past yr.   Also has occasional insomnia about once per wk but severe in that she can't get to sleep until 5am.  Does not want controlled substances.   Also has left foot dorsal rash with itching that flared in the past wk, and now has excoriations from the itching last night.  This was better with topical steroid in the past, but ran out.  Also,  Here with 2-3 days acute onset fever, facial pain, pressure, headache, general weakness and malaise, and greenish d/c,  Pt home tested Neg at home 2 days ago.    Wt Readings from Last 3 Encounters:  09/30/23 157 lb 6.4 oz (71.4 kg)  09/29/22 160 lb (72.6 kg)  08/25/22 162 lb 9.6 oz (73.8 kg)   BP Readings from Last 3 Encounters:  09/30/23 (!) 146/68  09/29/22 124/78  08/25/22 (!) 148/72   Immunization History  Administered Date(s) Administered   Fluad  Quad(high Dose 65+) 10/11/2018, 11/26/2019, 09/12/2020, 09/23/2021   Fluad  Trivalent(High Dose 65+) 10/27/2022   INFLUENZA, HIGH DOSE SEASONAL PF 11/17/2014, 10/11/2015, 09/29/2016, 10/05/2017, 09/21/2023   Influenza Split 12/11/2010, 11/18/2011   Influenza Whole 12/13/2008, 10/23/2009   Influenza,inj,Quad PF,6+ Mos 10/04/2012, 10/04/2013   Influenza-Unspecified 10/14/2011, 10/11/2015, 10/13/2017   MMR 02/14/1952   PFIZER Comirnaty (Gray Top)Covid-19 Tri-Sucrose  Vaccine 06/01/2020   PFIZER(Purple Top)SARS-COV-2 Vaccination 02/26/2019, 03/20/2019, 12/17/2019   Pfizer Covid-19 Vaccine Bivalent Booster 1yrs & up 10/10/2020   Pfizer(Comirnaty )Fall Seasonal Vaccine 12 years and older 10/31/2021, 09/29/2022   Pneumococcal Conjugate-13 08/03/2014   Pneumococcal Polysaccharide-23 03/17/2011   Respiratory Syncytial Virus Vaccine ,Recomb Aduvanted(Arexvy ) 12/09/2022   Td 12/13/2008   Tdap 02/14/2011   Varicella 02/14/1952   Zoster Recombinant(Shingrix ) 06/03/2022, 09/16/2022   Health Maintenance Due  Topic Date Due   DTaP/Tdap/Td (3 - Td or Tdap) 02/13/2021   Medicare Annual Wellness (AWV)  08/25/2023      Past Medical History:  Diagnosis Date   Abnormal mammogram 05/2013   L breast   ANXIETY    Breast cancer (HCC)    left breast   ECZEMA    Full dentures    GERD    Hypertension    Pt states she takes Nadolol  for Anxiety, not BP   OA (osteoarthritis)    Radiation 10/24/13-11/30/13   left chest wall and axilla   Wears contact lenses    also wears glasses   Past Surgical History:  Procedure Laterality Date   BREAST IMPLANT REMOVAL Left 10/24/2014   Procedure: REMOVAL LEFT BREAST IMPLANT, Excision of Nipple Areola Left Breast;  Surgeon: Earlis Ranks, MD;  Location: [facility];  Service: Plastics;  Laterality: Left;   BREAST LUMPECTOMY  1982   rt and lt  benign   BREAST RECONSTRUCTION WITH PLACEMENT OF TISSUE EXPANDER AND FLEX HD (ACELLULAR HYDRATED DERMIS) Left 08/04/2013   Procedure: BREAST RECONSTRUCTION WITH PLACEMENT OF TISSUE EXPANDER AND  POSSIBLE FLEX HD (ACELLULAR HYDRATED DERMIS);  Surgeon: Earlis Ranks, MD;  Location: [facility];  Service: Plastics;  Laterality: Left;   INCISION AND DRAINAGE OF WOUND Left 08/30/2013   Procedure: DEBRIDEMENT OF LEFT BREAST;  Surgeon: Earlis Ranks, MD;  Location: [facility];  Service: Plastics;  Laterality: Left;   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, had a second breast biopsy (R) in 2007   LIPOSUCTION WITH LIPOFILLING Left 05/30/2014   Procedure: LIPOFILLING TO LEFT CHEST;  Surgeon: Earlis Ranks, MD;  Location: [facility];  Service: Plastics;  Laterality: Left;   MASS EXCISION Right 05/30/2014   Procedure: EXCISION OF RIGHT AXILLARY LIPOMA;  Surgeon: Earlis Ranks, MD;  Location: [facility];  Service: Plastics;  Laterality: Right;   MASTECTOMY Left    MASTOPEXY Right 05/30/2014   Procedure: RIGHT MASTOPEXY FOR SYMMETRY;  Surgeon: Earlis Ranks, MD;  Location: [facility];  Service: Plastics;  Laterality: Right;   REDUCTION MAMMAPLASTY Right 1980   REMOVAL OF TISSUE EXPANDER AND PLACEMENT OF IMPLANT Left 05/30/2014   Procedure: REMOVAL OF LEFT TISSUE EXPANDER AND PLACEMENT OF IMPLANT;  Surgeon: Earlis Ranks, MD;  Location: [facility];  Service: Plastics;  Laterality: Left;    reports that she has never smoked. She has never used smokeless tobacco. She reports current alcohol use. She reports that she does not use drugs. family history includes Arthritis in an other family member; Breast cancer in her mother; Dementia in her mother; Hyperlipidemia in an other family member; Hypertension in an other family member; Kidney disease in an other family member. Allergies  Allergen Reactions   Morphine And Codeine  Shortness Of Breath   Sulfonamide Derivatives Hives and Itching   Doxycycline  Itching    And nausea   Penicillins Other (See Comments)    Dr. Taft Blush told her she was allergic to PCN.   Sulfa Antibiotics Hives   Current Outpatient Medications on File Prior to Visit  Medication Sig Dispense Refill   atorvastatin  (LIPITOR) 20 MG tablet TAKE 1 TABLET BY MOUTH EVERY DAY 90 tablet 3   Cholecalciferol (VITAMIN D ) 2000 units tablet Take 2,000 Units by mouth daily.     COVID-19 mRNA vaccine 2023-2024 (COMIRNATY ) syringe Inject into the  muscle. 0.3 mL 0   hydrochlorothiazide  (HYDRODIURIL ) 25 MG tablet Take 1 tablet (25 mg total) by mouth daily. 90 tablet 3   nadolol  (CORGARD ) 40 MG tablet TAKE 1/2 TABLET DAILY. 45 tablet 3   non-metallic deodorant (ALRA) MISC Apply 1 application topically daily as needed.     potassium chloride  (KLOR-CON  M) 10 MEQ tablet TAKE 2 TABLETS (=20MEQ) DAILY 180 tablet 3   Zoster Vaccine Adjuvanted (SHINGRIX ) injection Inject 0.5 mLs into the muscle. 0.5 mL 0   vitamin B-12 (CYANOCOBALAMIN ) 100 MCG tablet Take 50 mcg by mouth as needed. Every Other day.  Mon, Wed, Fri (Patient not taking: Reported on 09/30/2023)     No current facility-administered medications on file prior to visit.        ROS:  All others reviewed and negative.  Objective        PE:  BP (!) 146/68   Pulse (!) 46   Temp 98 F (36.7 C)   Ht 5' 3.75 (1.619  m)   Wt 157 lb 6.4 oz (71.4 kg)   SpO2 98%   BMI 27.23 kg/m                 Constitutional: Pt appears mild ill               HENT: Head: NCAT.                Right Ear: External ear normal.                 Left Ear: External ear normal. Bilat tm's with mild erythema.  Max sinus areas mild tender.  Pharynx with mild erythema, no exudate               Eyes: . Pupils are equal, round, and reactive to light. Conjunctivae and EOM are normal               Nose: without d/c or deformity               Neck: Neck supple. Gross normal ROM               Cardiovascular: Normal rate and regular rhythm.                 Pulmonary/Chest: Effort normal and breath sounds without rales or wheezing.                Abd:  Soft, NT, ND, + BS, no organomegaly               Neurological: Pt is alert. At baseline orientation, motor grossly intact               Skin: Skin is warm., no other new lesions, LE edema - none, left dorsal foot with eczematous scaly area about 3 cm diameter, with several excoriations but no bleeding               Psychiatric: Pt behavior is normal without agitation    Micro: none  Cardiac tracings I have personally interpreted today:  none  Pertinent Radiological findings (summarize): none   Lab Results  Component Value Date   WBC 5.3 09/23/2023   HGB 12.2 09/23/2023   HCT 36.2 09/23/2023   PLT 148.0 (L) 09/23/2023   GLUCOSE 111 (H) 09/23/2023   CHOL 113 09/23/2023   TRIG 79.0 09/23/2023   HDL 52.70 09/23/2023   LDLDIRECT 84.0 08/12/2018   LDLCALC 45 09/23/2023   ALT 17 09/23/2023   AST 25 09/23/2023   NA 142 09/23/2023   K 3.9 09/23/2023   CL 107 09/23/2023   CREATININE 0.63 09/23/2023   BUN 16 09/23/2023   CO2 30 09/23/2023   TSH 1.74 09/23/2023   HGBA1C 5.2 09/23/2023   Assessment/Plan:  THOMASENA VANDENHEUVEL is a 78 y.o. White or Caucasian [1] female with  has a past medical history of Abnormal mammogram (05/2013), ANXIETY, Breast cancer (HCC), ECZEMA, Full dentures, GERD, Hypertension, OA (osteoarthritis), Radiation (10/24/13-11/30/13), and Wears contact lenses.  Encounter for well adult exam with abnormal findings Age and sex appropriate education and counseling updated with regular exercise and diet Referrals for preventative services - for cologuard Immunizations addressed - for RSV and Tdap at pharmacy Smoking counseling  - none needed Evidence for depression or other mood disorder - none significant Most recent labs reviewed. I have personally reviewed and have noted: 1) the patient's medical and social history 2) The patient's current medications and supplements 3) The patient's height,  weight, and BMI have been recorded in the chart   B12 deficiency Lab Results  Component Value Date   VITAMINB12 1,110 (H) 09/23/2023   Overcontroled, ok to cont oral replacement - b12 1000 mcg but reduce to twice per wk  Dyslipidemia Lab Results  Component Value Date   LDLCALC 45 09/23/2023   Stable, pt to continue current statin lipitor 20 mg qd   Insomnia Mild intermittent, pt for otc melatonin up to 10 mg at bedtime  prn  Rash Worsening left dorsal foot - for triam cr prn asd  Acute URI Mild to mod, for antibx course zpack,  to f/u any worsening symptoms or concern  Followup: Return in about 1 year (around 09/29/2024).  Lynwood Rush, MD 09/30/2023 1:10 PM [facility]

## 2023-09-30 NOTE — Assessment & Plan Note (Signed)
 Worsening left dorsal foot - for triam cr prn asd

## 2023-09-30 NOTE — Assessment & Plan Note (Signed)
 Mild intermittent, pt for otc melatonin up to 10 mg at bedtime prn

## 2023-09-30 NOTE — Assessment & Plan Note (Signed)
 Age and sex appropriate education and counseling updated with regular exercise and diet Referrals for preventative services - for cologuard Immunizations addressed - for RSV and Tdap at pharmacy Smoking counseling  - none needed Evidence for depression or other mood disorder - none significant Most recent labs reviewed. I have personally reviewed and have noted: 1) the patient's medical and social history 2) The patient's current medications and supplements 3) The patient's height, weight, and BMI have been recorded in the chart

## 2023-09-30 NOTE — Assessment & Plan Note (Signed)
 Lab Results  Component Value Date   LDLCALC 45 09/23/2023   Stable, pt to continue current statin lipitor 20 mg qd

## 2023-10-06 DIAGNOSIS — Z1211 Encounter for screening for malignant neoplasm of colon: Secondary | ICD-10-CM | POA: Diagnosis not present

## 2023-10-08 ENCOUNTER — Ambulatory Visit (INDEPENDENT_AMBULATORY_CARE_PROVIDER_SITE_OTHER)

## 2023-10-08 VITALS — BP 136/70 | HR 50 | Ht 63.5 in | Wt 156.5 lb

## 2023-10-08 DIAGNOSIS — Z0000 Encounter for general adult medical examination without abnormal findings: Secondary | ICD-10-CM | POA: Diagnosis not present

## 2023-10-08 NOTE — Patient Instructions (Signed)
 Deanna Buck,  Thank you for taking the time for your Medicare Wellness Visit. I appreciate your continued commitment to your health goals. Please review the care plan we discussed, and feel free to reach out if I can assist you further.  Medicare recommends these wellness visits once per year to help you and your care team stay ahead of potential health issues. These visits are designed to focus on prevention, allowing your provider to concentrate on managing your acute and chronic conditions during your regular appointments.  Please note that Annual Wellness Visits do not include a physical exam. Some assessments may be limited, especially if the visit was conducted virtually. If needed, we may recommend a separate in-person follow-up with your provider.  Ongoing Care Seeing your primary care provider every 3 to 6 months helps us  monitor your health and provide consistent, personalized care. Last office visit on 09/30/2023.  You are due for a Tdap and can get that done at your pharmacy.    Referrals If a referral was made during today's visit and you haven't received any updates within two weeks, please contact the referred provider directly to check on the status.  Recommended Screenings:  Health Maintenance  Topic Date Due   DTaP/Tdap/Td vaccine (3 - Td or Tdap) 02/13/2021   COVID-19 Vaccine (8 - Pfizer risk 2024-25 season) 10/16/2023*   Medicare Annual Wellness Visit  10/07/2024   Pneumococcal Vaccine for age over 20  Completed   Flu Shot  Completed   DEXA scan (bone density measurement)  Completed   Hepatitis C Screening  Completed   Zoster (Shingles) Vaccine  Completed   HPV Vaccine  Aged Out   Meningitis B Vaccine  Aged Out   Breast Cancer Screening  Discontinued   Cologuard (Stool DNA test)  Discontinued  *Topic was postponed. The date shown is not the original due date.       08/25/2022    1:53 PM  Advanced Directives  Does Patient Have a Medical Advance Directive? Yes  Type  of Estate agent of [place];Living will  Copy of Healthcare Power of Attorney in Chart? No - copy requested   Advance Care Planning is important because it: Ensures you receive medical care that aligns with your values, goals, and preferences. Provides guidance to your family and loved ones, reducing the emotional burden of decision-making during critical moments.  Vision: Annual vision screenings are recommended for early detection of glaucoma, cataracts, and diabetic retinopathy. These exams can also reveal signs of chronic conditions such as diabetes and high blood pressure.  Dental: Annual dental screenings help detect early signs of oral cancer, gum disease, and other conditions linked to overall health, including heart disease and diabetes.  Please see the attached documents for additional preventive care recommendations.

## 2023-10-08 NOTE — Progress Notes (Signed)
 Subjective:   Deanna Buck is a 78 y.o. who presents for a Medicare Wellness preventive visit.  As a reminder, Annual Wellness Visits don't include a physical exam, and some assessments may be limited, especially if this visit is performed virtually. We may recommend an in-person follow-up visit with your provider if needed.  Visit Complete: In person  Persons Participating in Visit: Patient.  AWV Questionnaire: No: Patient Medicare AWV questionnaire was not completed prior to this visit.  Cardiac Risk Factors include: dyslipidemia;advanced age (>47men, >47 women)     Objective:    Today's Vitals   10/08/23 1514  BP: 136/70  Pulse: (!) 50  SpO2: 95%  Weight: 156 lb 8 oz (71 kg)  Height: 5' 3.5 (1.613 m)   Body mass index is 27.29 kg/m.     10/08/2023    3:20 PM 08/25/2022    1:53 PM 05/10/2015   11:16 AM 10/24/2014   11:14 AM 10/23/2014    1:09 PM 09/07/2014   11:27 AM 05/30/2014    6:35 AM  Advanced Directives  Does Patient Have a Medical Advance Directive? Yes Yes Yes  Yes  Yes  Yes  Yes   Type of Estate agent of [employer] Power of [place];Living will   Living will   Living will   Does patient want to make changes to medical advance directive?       No - Patient declined   Copy of Healthcare Power of Attorney in Chart? No - copy requested No - copy requested  No - copy requested    No - copy requested      Data saved with a previous flowsheet row definition    Current Medications (verified) Outpatient Encounter Medications as of 10/08/2023  Medication Sig   atorvastatin  (LIPITOR) 20 MG tablet TAKE 1 TABLET BY MOUTH EVERY DAY   Cholecalciferol (VITAMIN D ) 2000 units tablet Take 2,000 Units by mouth daily.   COVID-19 mRNA vaccine 2023-2024 (COMIRNATY ) syringe Inject into the muscle.   hydrochlorothiazide  (HYDRODIURIL ) 25 MG tablet Take 1 tablet (25 mg total) by mouth daily.   nadolol  (CORGARD ) 40 MG tablet TAKE 1/2 TABLET DAILY.    non-metallic deodorant (ALRA) MISC Apply 1 application topically daily as needed.   potassium chloride  (KLOR-CON  M) 10 MEQ tablet TAKE 2 TABLETS (=20MEQ) DAILY   triamcinolone  cream (KENALOG ) 0.1 % APPLY TWICE A DAY   Zoster Vaccine Adjuvanted (SHINGRIX ) injection Inject 0.5 mLs into the muscle.   vitamin B-12 (CYANOCOBALAMIN ) 100 MCG tablet Take 50 mcg by mouth as needed. Every Other day.  Mon, Wed, Fri (Patient not taking: Reported on 10/08/2023)   No facility-administered encounter medications on file as of 10/08/2023.    Allergies (verified) Morphine and codeine , Sulfonamide derivatives, Doxycycline , Penicillins, and Sulfa antibiotics   History: Past Medical History:  Diagnosis Date   Abnormal mammogram 05/2013   L breast   ANXIETY    Breast cancer (HCC)    left breast   ECZEMA    Full dentures    GERD    Hypertension    Pt states she takes Nadolol  for Anxiety, not BP   OA (osteoarthritis)    Radiation 10/24/13-11/30/13   left chest wall and axilla   Wears contact lenses    also wears glasses   Past Surgical History:  Procedure Laterality Date   BREAST IMPLANT REMOVAL Left 10/24/2014   Procedure: REMOVAL LEFT BREAST IMPLANT, Excision of Nipple Areola Left Breast;  Surgeon: Earlis Ranks, MD;  Location: [facility];  Service: Plastics;  Laterality: Left;   BREAST LUMPECTOMY  1982   rt and lt benign   BREAST RECONSTRUCTION WITH PLACEMENT OF TISSUE EXPANDER AND FLEX HD (ACELLULAR HYDRATED DERMIS) Left 08/04/2013   Procedure: BREAST RECONSTRUCTION WITH PLACEMENT OF TISSUE EXPANDER AND  POSSIBLE FLEX HD (ACELLULAR HYDRATED DERMIS);  Surgeon: Earlis Ranks, MD;  Location: [facility];  Service: Plastics;  Laterality: Left;   INCISION AND DRAINAGE OF WOUND Left 08/30/2013   Procedure: DEBRIDEMENT OF LEFT BREAST;  Surgeon: Earlis Ranks, MD;  Location: [facility];  Service: Plastics;  Laterality: Left;   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, had a second breast biopsy (R) in 2007   LIPOSUCTION WITH LIPOFILLING Left 05/30/2014   Procedure: LIPOFILLING TO LEFT CHEST;  Surgeon: Earlis Ranks, MD;  Location: [facility];  Service: Plastics;  Laterality: Left;   MASS EXCISION Right 05/30/2014   Procedure: EXCISION OF RIGHT AXILLARY LIPOMA;  Surgeon: Earlis Ranks, MD;  Location: [facility];  Service: Plastics;  Laterality: Right;   MASTECTOMY Left    MASTOPEXY Right 05/30/2014   Procedure: RIGHT MASTOPEXY FOR SYMMETRY;  Surgeon: Earlis Ranks, MD;  Location: [facility];  Service: Plastics;  Laterality: Right;   REDUCTION MAMMAPLASTY Right 1980   REMOVAL OF TISSUE EXPANDER AND PLACEMENT OF IMPLANT Left 05/30/2014   Procedure: REMOVAL OF LEFT TISSUE EXPANDER AND PLACEMENT OF IMPLANT;  Surgeon: Earlis Ranks, MD;  Location: [facility];  Service: Plastics;  Laterality: Left;   Family History  Problem Relation Age of Onset   Breast cancer Mother    Dementia Mother    Arthritis Other    Hypertension Other        parent   Hyperlipidemia Other        parent   Kidney disease Other        parent   Social History   Socioeconomic History   Marital status: Married    Spouse name: Koren   Number of children: 2   Years of education: Not on file   Highest education level: Not on file  Occupational History    Employer: RETIRED  Tobacco Use   Smoking status: Never   Smokeless tobacco: Never   Tobacco comments:    Married, but in stressful relationship with spouse  Vaping Use   Vaping status: Never Used  Substance and Sexual Activity   Alcohol use: Yes    Comment:  rare mixed drink   Drug use: No   Sexual activity: Yes  Other Topics Concern   Not on file  Social History Narrative   Lives with husband and 1 dog/2025.   Social Drivers of Corporate investment banker Strain: Low Risk  (10/08/2023)   Overall Financial Resource Strain  (CARDIA)    Difficulty of Paying Living Expenses: Not hard at all  Food Insecurity: No Food Insecurity (10/08/2023)   Hunger Vital Sign    Worried About Running Out of Food in the Last Year: Never true    Ran Out of Food in the Last Year: Never true  Transportation Needs: No Transportation Needs (10/08/2023)   PRAPARE - Administrator, Civil Service (Medical): No    Lack of Transportation (Non-Medical): No  Physical Activity: Inactive (10/08/2023)   Exercise Vital Sign    Days of Exercise per Week: 0 days    Minutes of Exercise per  Session: 0 min  Stress: No Stress Concern Present (10/08/2023)   [employer] of Occupational Health - Occupational Stress Questionnaire    Feeling of Stress: Not at all  Social Connections: Moderately Isolated (10/08/2023)   Social Connection and Isolation Panel    Frequency of Communication with Friends and Family: More than three times a week    Frequency of Social Gatherings with Friends and Family: Once a week    Attends Religious Services: Never    Database administrator or Organizations: No    Attends Engineer, structural: Never    Marital Status: Married    Tobacco Counseling Counseling given: Not Answered Tobacco comments: Married, but in stressful relationship with spouse    Clinical Intake:  Pre-visit preparation completed: Yes  Pain : No/denies pain     BMI - recorded: 27.29 Nutritional Status: BMI 25 -29 Overweight Nutritional Risks: Nausea/ vomitting/ diarrhea (x 2weeks ago/diarrhea) Diabetes: No  Lab Results  Component Value Date   HGBA1C 5.2 09/23/2023   HGBA1C 5.0 09/25/2022   HGBA1C 5.3 09/10/2021     How often do you need to have someone help you when you read instructions, pamphlets, or other written materials from your doctor or pharmacy?: 1 - Never  Interpreter Needed?: No  Information entered by :: Chrishawn Boley, RMA   Activities of Daily Living     10/08/2023    3:04 PM  In your  present state of health, do you have any difficulty performing the following activities:  Hearing? 0  Vision? 0  Difficulty concentrating or making decisions? 0  Walking or climbing stairs? 0  Dressing or bathing? 0  Doing errands, shopping? 0  Preparing Food and eating ? N  Using the Toilet? N  In the past six months, have you accidently leaked urine? Y  Do you have problems with loss of bowel control? N  Managing your Medications? N  Managing your Finances? N  Housekeeping or managing your Housekeeping? N    Patient Care Team: Norleen Lynwood ORN, MD as PCP - General (Internal Medicine) Sarrah Browning, MD as Consulting Physician (Obstetrics and Gynecology) Joshua Blamer, MD as Consulting Physician (Dermatology) Madelyn Deanne BRAVO, OD (Optometry)  I have updated your Care Teams any recent Medical Services you may have received from other providers in the past year.     Assessment:   This is a routine wellness examination for [place].  Hearing/Vision screen Hearing Screening - Comments:: Denies hearing difficulties   Vision Screening - Comments:: Wears eyeglasses and contact lenses/Dr. Madelyn   Goals Addressed               This Visit's Progress     Patient Stated (pt-stated)        Getting up in the morning-2025       Depression Screen     10/08/2023    3:28 PM 09/29/2022   11:15 AM 08/25/2022    1:58 PM 09/23/2021   11:37 AM 09/23/2021   11:26 AM 09/12/2020   11:32 AM 09/12/2020   11:07 AM  PHQ 2/9 Scores  PHQ - 2 Score 0 0 0 0 0 0 0  PHQ- 9 Score 4 0 3  0      Fall Risk     10/08/2023    3:25 PM 09/30/2023   11:32 AM 09/29/2022   11:15 AM 08/25/2022    1:53 PM 09/23/2021   11:37 AM  Fall Risk   Falls in the past year? 0  0 0 0 0  Number falls in past yr: 0 0 0 0 0  Injury with Fall? 0 0 0 0 0  Risk for fall due to :   No Fall Risks No Fall Risks   Follow up Falls evaluation completed;Falls prevention discussed  Falls evaluation completed Falls prevention  discussed     MEDICARE RISK AT HOME:  Medicare Risk at Home Any stairs in or around the home?: Yes (inside and outside) If so, are there any without handrails?: No Home free of loose throw rugs in walkways, pet beds, electrical cords, etc?: Yes Adequate lighting in your home to reduce risk of falls?: Yes Life alert?: No Use of a cane, walker or w/c?: No Grab bars in the bathroom?: No Shower chair or bench in shower?: Yes Elevated toilet seat or a handicapped toilet?: No  TIMED UP AND GO:  Was the test performed?  Yes  Length of time to ambulate 10 feet: 15 sec Gait steady and fast without use of assistive device  Cognitive Function: Declined/Normal: No cognitive concerns noted by patient or family. Patient alert, oriented, able to answer questions appropriately and recall recent events. No signs of memory loss or confusion.        08/25/2022    1:54 PM  6CIT Screen  What Year? 0 points  What month? 0 points  What time? 3 points  Count back from 20 0 points  Months in reverse 0 points  Repeat phrase 0 points  Total Score 3 points    Immunizations Immunization History  Administered Date(s) Administered   Fluad  Quad(high Dose 65+) 10/11/2018, 11/26/2019, 09/12/2020, 09/23/2021   Fluad  Trivalent(High Dose 65+) 10/27/2022   INFLUENZA, HIGH DOSE SEASONAL PF 11/17/2014, 10/11/2015, 09/29/2016, 10/05/2017, 09/21/2023   Influenza Split 12/11/2010, 11/18/2011   Influenza Whole 12/13/2008, 10/23/2009   Influenza,inj,Quad PF,6+ Mos 10/04/2012, 10/04/2013   Influenza-Unspecified 10/14/2011, 10/11/2015, 10/13/2017   MMR 02/14/1951   PFIZER Comirnaty (Gray Top)Covid-19 Tri-Sucrose Vaccine 06/01/2020   PFIZER(Purple Top)SARS-COV-2 Vaccination 02/26/2019, 03/20/2019, 12/17/2019   Pfizer Covid-19 Vaccine Bivalent Booster 32yrs & up 10/10/2020   Pfizer(Comirnaty )Fall Seasonal Vaccine 12 years and older 10/31/2021, 09/29/2022   Pneumococcal Conjugate-13 08/03/2014   Pneumococcal  Polysaccharide-23 03/17/2011   Respiratory Syncytial Virus Vaccine ,Recomb Aduvanted(Arexvy ) 12/09/2022   Td 12/13/2008   Tdap 02/14/2011   Varicella 02/14/1951   Zoster Recombinant(Shingrix ) 06/03/2022, 09/16/2022    Screening Tests Health Maintenance  Topic Date Due   DTaP/Tdap/Td (3 - Td or Tdap) 02/13/2021   COVID-19 Vaccine (8 - Pfizer risk 2024-25 season) 10/16/2023 (Originally 09/14/2023)   Medicare Annual Wellness (AWV)  10/07/2024   Pneumococcal Vaccine: 50+ Years  Completed   Influenza Vaccine  Completed   DEXA SCAN  Completed   Hepatitis C Screening  Completed   Zoster Vaccines- Shingrix   Completed   HPV VACCINES  Aged Out   Meningococcal B Vaccine  Aged Out   Mammogram  Discontinued   Fecal DNA (Cologuard)  Discontinued    Health Maintenance Items Addressed: See Nurse Notes at the end of this note  Additional Screening:  Vision Screening: Recommended annual ophthalmology exams for early detection of glaucoma and other disorders of the eye. Is the patient up to date with their annual eye exam?  No  Who is the provider or what is the name of the office in which the patient attends annual eye exams? Dr. Madelyn  Dental Screening: Recommended annual dental exams for proper oral hygiene  Community Resource Referral / Chronic Care Management: CRR required  this visit?  No   CCM required this visit?  No   Plan:    I have personally reviewed and noted the following in the patient's chart:   Medical and social history Use of alcohol, tobacco or illicit drugs  Current medications and supplements including opioid prescriptions. Patient is not currently taking opioid prescriptions. Functional ability and status Nutritional status Physical activity Advanced directives List of other physicians Hospitalizations, surgeries, and ER visits in previous 12 months Vitals Screenings to include cognitive, depression, and falls Referrals and appointments  In addition, I have  reviewed and discussed with patient certain preventive protocols, quality metrics, and best practice recommendations. A written personalized care plan for preventive services as well as general preventive health recommendations were provided to patient.   Gera Inboden L Xana Bradt, CMA   10/08/2023   After Visit Summary: (MyChart) Due to this being a telephonic visit, the after visit summary with patients personalized plan was offered to patient via MyChart   Notes: Patient is due for a Tdap.  She stated that she has had a more recent DEXA.  I have sent a request for her record out today.

## 2023-10-09 LAB — COLOGUARD: COLOGUARD: NEGATIVE

## 2023-10-11 ENCOUNTER — Ambulatory Visit: Payer: Self-pay | Admitting: Internal Medicine

## 2023-10-16 ENCOUNTER — Other Ambulatory Visit: Payer: Self-pay | Admitting: Internal Medicine

## 2023-10-16 ENCOUNTER — Other Ambulatory Visit: Payer: Self-pay

## 2023-10-20 ENCOUNTER — Other Ambulatory Visit: Payer: Self-pay

## 2023-10-20 ENCOUNTER — Other Ambulatory Visit: Payer: Self-pay | Admitting: Internal Medicine

## 2023-11-26 ENCOUNTER — Other Ambulatory Visit (HOSPITAL_BASED_OUTPATIENT_CLINIC_OR_DEPARTMENT_OTHER): Payer: Self-pay

## 2023-11-26 MED ORDER — COMIRNATY 30 MCG/0.3ML IM SUSY
0.3000 mL | PREFILLED_SYRINGE | Freq: Once | INTRAMUSCULAR | 0 refills | Status: AC
  Filled 2023-11-26: qty 0.3, 1d supply, fill #0

## 2024-10-03 ENCOUNTER — Encounter: Admitting: Internal Medicine
